# Patient Record
Sex: Female | Born: 1985 | Race: White | Hispanic: No | Marital: Married | Smoking: Never smoker
Health system: Southern US, Community
[De-identification: ages and names within clinical notes are randomized; demographics above are authoritative.]

## PROBLEM LIST (undated history)

## (undated) ENCOUNTER — Inpatient Hospital Stay (HOSPITAL_COMMUNITY): Payer: Self-pay

## (undated) DIAGNOSIS — G4733 Obstructive sleep apnea (adult) (pediatric): Secondary | ICD-10-CM

## (undated) DIAGNOSIS — T7840XA Allergy, unspecified, initial encounter: Secondary | ICD-10-CM

## (undated) DIAGNOSIS — E538 Deficiency of other specified B group vitamins: Secondary | ICD-10-CM

## (undated) DIAGNOSIS — Z9889 Other specified postprocedural states: Secondary | ICD-10-CM

## (undated) DIAGNOSIS — N926 Irregular menstruation, unspecified: Secondary | ICD-10-CM

## (undated) DIAGNOSIS — J301 Allergic rhinitis due to pollen: Secondary | ICD-10-CM

## (undated) DIAGNOSIS — N390 Urinary tract infection, site not specified: Secondary | ICD-10-CM

## (undated) DIAGNOSIS — G43909 Migraine, unspecified, not intractable, without status migrainosus: Secondary | ICD-10-CM

## (undated) DIAGNOSIS — N898 Other specified noninflammatory disorders of vagina: Secondary | ICD-10-CM

## (undated) DIAGNOSIS — F419 Anxiety disorder, unspecified: Secondary | ICD-10-CM

## (undated) DIAGNOSIS — B019 Varicella without complication: Secondary | ICD-10-CM

## (undated) DIAGNOSIS — Z87442 Personal history of urinary calculi: Secondary | ICD-10-CM

## (undated) DIAGNOSIS — D509 Iron deficiency anemia, unspecified: Secondary | ICD-10-CM

## (undated) DIAGNOSIS — N760 Acute vaginitis: Secondary | ICD-10-CM

## (undated) DIAGNOSIS — R112 Nausea with vomiting, unspecified: Secondary | ICD-10-CM

## (undated) DIAGNOSIS — Z309 Encounter for contraceptive management, unspecified: Secondary | ICD-10-CM

## (undated) DIAGNOSIS — I10 Essential (primary) hypertension: Secondary | ICD-10-CM

## (undated) DIAGNOSIS — N930 Postcoital and contact bleeding: Secondary | ICD-10-CM

## (undated) DIAGNOSIS — N189 Chronic kidney disease, unspecified: Secondary | ICD-10-CM

## (undated) DIAGNOSIS — B9689 Other specified bacterial agents as the cause of diseases classified elsewhere: Secondary | ICD-10-CM

## (undated) HISTORY — DX: Other specified bacterial agents as the cause of diseases classified elsewhere: B96.89

## (undated) HISTORY — DX: Encounter for contraceptive management, unspecified: Z30.9

## (undated) HISTORY — DX: Postcoital and contact bleeding: N93.0

## (undated) HISTORY — DX: Urinary tract infection, site not specified: N39.0

## (undated) HISTORY — DX: Deficiency of other specified B group vitamins: E53.8

## (undated) HISTORY — PX: DILATION AND CURETTAGE OF UTERUS: SHX78

## (undated) HISTORY — PX: LAPAROSCOPIC GASTRIC SLEEVE RESECTION: SHX5895

## (undated) HISTORY — PX: TONSILLECTOMY: SUR1361

## (undated) HISTORY — DX: Migraine, unspecified, not intractable, without status migrainosus: G43.909

## (undated) HISTORY — DX: Iron deficiency anemia, unspecified: D50.9

## (undated) HISTORY — DX: Allergy, unspecified, initial encounter: T78.40XA

## (undated) HISTORY — DX: Essential (primary) hypertension: I10

## (undated) HISTORY — DX: Anxiety disorder, unspecified: F41.9

## (undated) HISTORY — DX: Obstructive sleep apnea (adult) (pediatric): G47.33

## (undated) HISTORY — DX: Irregular menstruation, unspecified: N92.6

## (undated) HISTORY — DX: Varicella without complication: B01.9

## (undated) HISTORY — DX: Acute vaginitis: N76.0

## (undated) HISTORY — DX: Allergic rhinitis due to pollen: J30.1

---
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
## 2005-01-11 ENCOUNTER — Observation Stay (HOSPITAL_COMMUNITY): Admission: RE | Admit: 2005-01-11 | Discharge: 2005-01-11 | Payer: Self-pay | Admitting: Obstetrics and Gynecology

## 2005-01-12 ENCOUNTER — Ambulatory Visit (HOSPITAL_COMMUNITY): Admission: RE | Admit: 2005-01-12 | Discharge: 2005-01-12 | Payer: Self-pay | Admitting: Obstetrics & Gynecology

## 2005-02-02 ENCOUNTER — Ambulatory Visit: Payer: Self-pay | Admitting: Obstetrics and Gynecology

## 2005-02-02 ENCOUNTER — Inpatient Hospital Stay (HOSPITAL_COMMUNITY): Admission: AD | Admit: 2005-02-02 | Discharge: 2005-02-02 | Payer: Self-pay | Admitting: *Deleted

## 2005-02-06 ENCOUNTER — Ambulatory Visit (HOSPITAL_COMMUNITY): Admission: AD | Admit: 2005-02-06 | Discharge: 2005-02-06 | Payer: Self-pay | Admitting: Obstetrics and Gynecology

## 2005-02-07 ENCOUNTER — Observation Stay (HOSPITAL_COMMUNITY): Admission: RE | Admit: 2005-02-07 | Discharge: 2005-02-08 | Payer: Self-pay | Admitting: Obstetrics and Gynecology

## 2005-02-08 ENCOUNTER — Ambulatory Visit: Payer: Self-pay | Admitting: Cardiology

## 2005-02-08 ENCOUNTER — Encounter: Payer: Self-pay | Admitting: Obstetrics and Gynecology

## 2005-02-08 ENCOUNTER — Ambulatory Visit (HOSPITAL_COMMUNITY): Admission: RE | Admit: 2005-02-08 | Discharge: 2005-02-08 | Payer: Self-pay | Admitting: Obstetrics and Gynecology

## 2005-02-20 ENCOUNTER — Ambulatory Visit (HOSPITAL_COMMUNITY): Admission: AD | Admit: 2005-02-20 | Discharge: 2005-02-20 | Payer: Self-pay | Admitting: Obstetrics and Gynecology

## 2005-02-28 ENCOUNTER — Ambulatory Visit (HOSPITAL_COMMUNITY): Admission: AD | Admit: 2005-02-28 | Discharge: 2005-02-28 | Payer: Self-pay | Admitting: Obstetrics and Gynecology

## 2005-03-02 ENCOUNTER — Ambulatory Visit (HOSPITAL_COMMUNITY): Admission: AD | Admit: 2005-03-02 | Discharge: 2005-03-02 | Payer: Self-pay | Admitting: Obstetrics and Gynecology

## 2005-03-24 ENCOUNTER — Ambulatory Visit (HOSPITAL_COMMUNITY): Admission: AD | Admit: 2005-03-24 | Discharge: 2005-03-24 | Payer: Self-pay | Admitting: Obstetrics and Gynecology

## 2005-03-25 ENCOUNTER — Inpatient Hospital Stay (HOSPITAL_COMMUNITY): Admission: AD | Admit: 2005-03-25 | Discharge: 2005-03-27 | Payer: Self-pay | Admitting: Obstetrics and Gynecology

## 2005-06-02 ENCOUNTER — Emergency Department (HOSPITAL_COMMUNITY): Admission: EM | Admit: 2005-06-02 | Discharge: 2005-06-02 | Payer: Self-pay | Admitting: Emergency Medicine

## 2005-07-05 ENCOUNTER — Emergency Department (HOSPITAL_COMMUNITY): Admission: EM | Admit: 2005-07-05 | Discharge: 2005-07-05 | Payer: Self-pay | Admitting: Emergency Medicine

## 2005-07-21 ENCOUNTER — Emergency Department (HOSPITAL_COMMUNITY): Admission: EM | Admit: 2005-07-21 | Discharge: 2005-07-21 | Payer: Self-pay | Admitting: Emergency Medicine

## 2005-07-22 ENCOUNTER — Ambulatory Visit: Payer: Self-pay | Admitting: Family Medicine

## 2005-07-23 ENCOUNTER — Ambulatory Visit: Payer: Self-pay | Admitting: Family Medicine

## 2006-06-29 ENCOUNTER — Observation Stay (HOSPITAL_COMMUNITY): Admission: AD | Admit: 2006-06-29 | Discharge: 2006-06-29 | Payer: Self-pay | Admitting: Obstetrics and Gynecology

## 2006-08-14 ENCOUNTER — Observation Stay (HOSPITAL_COMMUNITY): Admission: AD | Admit: 2006-08-14 | Discharge: 2006-08-15 | Payer: Self-pay | Admitting: Obstetrics and Gynecology

## 2006-09-18 ENCOUNTER — Emergency Department (HOSPITAL_COMMUNITY): Admission: EM | Admit: 2006-09-18 | Discharge: 2006-09-18 | Payer: Self-pay | Admitting: Emergency Medicine

## 2006-09-27 ENCOUNTER — Ambulatory Visit (HOSPITAL_COMMUNITY): Admission: AD | Admit: 2006-09-27 | Discharge: 2006-09-27 | Payer: Self-pay | Admitting: Obstetrics and Gynecology

## 2006-10-07 ENCOUNTER — Ambulatory Visit (HOSPITAL_COMMUNITY): Admission: RE | Admit: 2006-10-07 | Discharge: 2006-10-07 | Payer: Self-pay | Admitting: Orthopaedic Surgery

## 2006-10-16 ENCOUNTER — Inpatient Hospital Stay (HOSPITAL_COMMUNITY): Admission: RE | Admit: 2006-10-16 | Discharge: 2006-10-18 | Payer: Self-pay | Admitting: Obstetrics and Gynecology

## 2007-10-11 ENCOUNTER — Emergency Department (HOSPITAL_COMMUNITY): Admission: EM | Admit: 2007-10-11 | Discharge: 2007-10-11 | Payer: Self-pay | Admitting: Emergency Medicine

## 2008-01-05 ENCOUNTER — Emergency Department (HOSPITAL_COMMUNITY): Admission: EM | Admit: 2008-01-05 | Discharge: 2008-01-05 | Payer: Self-pay | Admitting: Emergency Medicine

## 2008-04-19 ENCOUNTER — Emergency Department (HOSPITAL_COMMUNITY): Admission: EM | Admit: 2008-04-19 | Discharge: 2008-04-20 | Payer: Self-pay | Admitting: *Deleted

## 2008-04-29 ENCOUNTER — Ambulatory Visit: Payer: Self-pay | Admitting: Obstetrics & Gynecology

## 2008-04-29 ENCOUNTER — Inpatient Hospital Stay (HOSPITAL_COMMUNITY): Admission: AD | Admit: 2008-04-29 | Discharge: 2008-04-29 | Payer: Self-pay | Admitting: Obstetrics & Gynecology

## 2008-08-03 ENCOUNTER — Ambulatory Visit: Payer: Self-pay | Admitting: Family Medicine

## 2008-08-03 ENCOUNTER — Inpatient Hospital Stay (HOSPITAL_COMMUNITY): Admission: AD | Admit: 2008-08-03 | Discharge: 2008-08-03 | Payer: Self-pay | Admitting: Obstetrics & Gynecology

## 2008-08-07 ENCOUNTER — Inpatient Hospital Stay (HOSPITAL_COMMUNITY): Admission: AD | Admit: 2008-08-07 | Discharge: 2008-08-09 | Payer: Self-pay | Admitting: Obstetrics & Gynecology

## 2008-08-07 ENCOUNTER — Ambulatory Visit: Payer: Self-pay | Admitting: Obstetrics and Gynecology

## 2008-08-11 ENCOUNTER — Encounter: Admission: RE | Admit: 2008-08-11 | Discharge: 2008-09-10 | Payer: Self-pay | Admitting: Obstetrics and Gynecology

## 2008-09-11 ENCOUNTER — Encounter: Admission: RE | Admit: 2008-09-11 | Discharge: 2008-10-10 | Payer: Self-pay | Admitting: Obstetrics and Gynecology

## 2008-10-11 ENCOUNTER — Encounter: Admission: RE | Admit: 2008-10-11 | Discharge: 2008-11-10 | Payer: Self-pay | Admitting: Obstetrics and Gynecology

## 2008-11-11 ENCOUNTER — Encounter: Admission: RE | Admit: 2008-11-11 | Discharge: 2008-12-10 | Payer: Self-pay | Admitting: Obstetrics and Gynecology

## 2008-12-11 ENCOUNTER — Encounter: Admission: RE | Admit: 2008-12-11 | Discharge: 2009-01-10 | Payer: Self-pay | Admitting: Obstetrics and Gynecology

## 2009-01-11 ENCOUNTER — Encounter: Admission: RE | Admit: 2009-01-11 | Discharge: 2009-02-10 | Payer: Self-pay | Admitting: Obstetrics and Gynecology

## 2009-02-11 ENCOUNTER — Encounter: Admission: RE | Admit: 2009-02-11 | Discharge: 2009-03-12 | Payer: Self-pay | Admitting: Obstetrics and Gynecology

## 2009-03-13 ENCOUNTER — Encounter: Admission: RE | Admit: 2009-03-13 | Discharge: 2009-04-12 | Payer: Self-pay | Admitting: Obstetrics and Gynecology

## 2009-12-30 ENCOUNTER — Emergency Department (HOSPITAL_COMMUNITY): Admission: EM | Admit: 2009-12-30 | Discharge: 2009-12-30 | Payer: Self-pay | Admitting: Emergency Medicine

## 2010-02-07 ENCOUNTER — Emergency Department (HOSPITAL_COMMUNITY): Admission: EM | Admit: 2010-02-07 | Discharge: 2010-02-07 | Payer: Self-pay | Admitting: Emergency Medicine

## 2010-06-13 ENCOUNTER — Ambulatory Visit: Admit: 2010-06-13 | Payer: Self-pay | Admitting: Family Medicine

## 2010-06-17 ENCOUNTER — Encounter: Payer: Self-pay | Admitting: *Deleted

## 2010-09-06 LAB — URINALYSIS, ROUTINE W REFLEX MICROSCOPIC
Bilirubin Urine: NEGATIVE
Glucose, UA: NEGATIVE mg/dL
Hgb urine dipstick: NEGATIVE
Ketones, ur: 15 mg/dL — AB
Nitrite: NEGATIVE
Nitrite: NEGATIVE
Specific Gravity, Urine: 1.01 (ref 1.005–1.030)
Specific Gravity, Urine: >1.03 — ABNORMAL HIGH (ref 1.005–1.030)
Urobilinogen, UA: 2 mg/dL — ABNORMAL HIGH (ref 0.0–1.0)
pH: 6 (ref 5.0–8.0)

## 2010-09-06 LAB — CBC
HCT: 29.4 % — ABNORMAL LOW (ref 36.0–46.0)
Hemoglobin: 8.2 g/dL — ABNORMAL LOW (ref 12.0–15.0)
Hemoglobin: 9.5 g/dL — ABNORMAL LOW (ref 12.0–15.0)
MCHC: 32.4 g/dL (ref 30.0–36.0)
MCV: 71.9 fL — ABNORMAL LOW (ref 78.0–100.0)
Platelets: 291 10*3/uL (ref 150–400)
Platelets: 352 10*3/uL (ref 150–400)
RBC: 3.57 MIL/uL — ABNORMAL LOW (ref 3.87–5.11)
RBC: 4.11 MIL/uL (ref 3.87–5.11)
RDW: 15.4 % (ref 11.5–15.5)
WBC: 8 10*3/uL (ref 4.0–10.5)

## 2010-09-06 LAB — URINE MICROSCOPIC-ADD ON

## 2010-09-06 LAB — URINE CULTURE

## 2010-09-06 LAB — RPR: RPR Ser Ql: NONREACTIVE

## 2010-09-06 LAB — WET PREP, GENITAL
Trich, Wet Prep: NONE SEEN
Yeast Wet Prep HPF POC: NONE SEEN

## 2010-09-06 LAB — GC/CHLAMYDIA PROBE AMP, GENITAL: Chlamydia, DNA Probe: NEGATIVE

## 2010-09-25 ENCOUNTER — Ambulatory Visit (INDEPENDENT_AMBULATORY_CARE_PROVIDER_SITE_OTHER): Payer: BC Managed Care – PPO | Admitting: Family Medicine

## 2010-09-25 ENCOUNTER — Encounter: Payer: Self-pay | Admitting: Family Medicine

## 2010-09-25 VITALS — BP 128/82 | HR 75 | Temp 97.4°F | Resp 14 | Ht 63.75 in | Wt 226.0 lb

## 2010-09-25 DIAGNOSIS — G43909 Migraine, unspecified, not intractable, without status migrainosus: Secondary | ICD-10-CM

## 2010-09-25 DIAGNOSIS — F419 Anxiety disorder, unspecified: Secondary | ICD-10-CM

## 2010-09-25 DIAGNOSIS — R635 Abnormal weight gain: Secondary | ICD-10-CM

## 2010-09-25 DIAGNOSIS — F411 Generalized anxiety disorder: Secondary | ICD-10-CM

## 2010-09-25 LAB — BASIC METABOLIC PANEL
BUN: 14 mg/dL (ref 6–23)
CO2: 30 mEq/L (ref 19–32)
Calcium: 9.1 mg/dL (ref 8.4–10.5)
GFR: 103.56 mL/min (ref >60.00)
Glucose, Bld: 85 mg/dL (ref 70–99)
Sodium: 140 mEq/L (ref 135–145)

## 2010-09-25 LAB — HEPATIC FUNCTION PANEL
Bilirubin, Direct: 0.1 mg/dL (ref 0.0–0.3)
Total Bilirubin: 0.7 mg/dL (ref 0.3–1.2)

## 2010-09-25 LAB — CBC WITH DIFFERENTIAL/PLATELET
HCT: 40.4 % (ref 36.0–46.0)
Hemoglobin: 13.9 g/dL (ref 12.0–15.0)
MCHC: 34.5 g/dL (ref 30.0–36.0)
MCV: 88.8 fl (ref 78.0–100.0)
Monocytes Absolute: 0.5 10*3/uL (ref 0.1–1.0)
Neutrophils Relative %: 65.1 % (ref 43.0–77.0)
Platelets: 313 10*3/uL (ref 150.0–400.0)
RBC: 4.55 Mil/uL (ref 3.87–5.11)

## 2010-09-25 LAB — TSH: TSH: 1.68 u[IU]/mL (ref 0.35–5.50)

## 2010-09-25 MED ORDER — PHENTERMINE HCL 37.5 MG PO CAPS: 37.5000 mg | ORAL_CAPSULE | ORAL | Status: DC

## 2010-09-25 MED ORDER — CITALOPRAM HYDROBROMIDE 20 MG PO TABS: 20.0000 mg | ORAL_TABLET | Freq: Every day | ORAL | Status: DC

## 2010-09-25 NOTE — Progress Notes (Signed)
  Subjective:    Patient ID: Audrey [name], female    DOB: 1986-04-14, 25 y.o.   MRN: [id number]  HPI 25 yr old female to re-establish with [place] after an absence of around 3 years. She has several issues to discuss. First she asks for help in losing weight. She has been following a strict low carb and low fat diet and she works out at [employer] 4 days a week, but she cannot seem to lose weight. This is very frustrating to her. Also she has had trouble with anxiety for several years. She worries about everything, can't relax, and she has a very short temper. She gets angry at her children over inconsequential things. She denies any depression symptoms.    Review of Systems  Constitutional: Negative.   Respiratory: Negative.   Cardiovascular: Negative.   Gastrointestinal: Negative.   Psychiatric/Behavioral: Positive for decreased concentration. Negative for hallucinations, behavioral problems, confusion, sleep disturbance, dysphoric mood and agitation. The patient is nervous/anxious. The patient is not hyperactive.        Objective:   Physical Exam  Constitutional: She appears well-developed and well-nourished.  Neck: No thyromegaly present.  Cardiovascular: Normal rate, regular rhythm, normal heart sounds and intact distal pulses.   Pulmonary/Chest: Effort normal and breath sounds normal.  Lymphadenopathy:    She has no cervical adenopathy.  Psychiatric: She has a normal mood and affect. Her behavior is normal. Thought content normal.          Assessment & Plan:  We will try her on an appetite suppressant med. Try Citalopram for anxiety.

## 2010-09-27 ENCOUNTER — Telehealth: Payer: Self-pay | Admitting: Family Medicine

## 2010-09-27 NOTE — Telephone Encounter (Signed)
LMOM to inform Pt. 

## 2010-09-27 NOTE — Telephone Encounter (Signed)
Message copied by Burnard Leigh on Thu Sep 27, 2010  8:56 AM ------      Message from: Dwaine Deter      Created: Wed Sep 26, 2010 12:28 PM       All normal

## 2010-10-09 NOTE — Group Therapy Note (Signed)
NAMEGeorgina Pillion [name]                ACCOUNT NO.:  [id number]   MEDICAL RECORD NO.:  [id number]          PATIENT TYPE:  INP   LOCATION:  LDR1                          FACILITY:  [facility]   PHYSICIAN:  Lazaro Arms, M.D.   DATE OF BIRTH:  1985/11/17   DATE OF PROCEDURE:  DATE OF DISCHARGE:                                 PROGRESS NOTE   Audrey [name] got comfortable with her epidural and progressed rapidly through  labor.  She was noted to be completely dilated with a urge to push  around 11:45.  I came over and she had a short second stage where she  pushed over three contractions and had a spontaneous vaginal delivery of  a viable female infant at 12:03.  The mouth and nose were suctioned on  the perineum with a bulb syringe and the body delivered without  difficulty.  Twenty units of Pitocin diluted in 1000 cc of lactated  Ringer's was infused rapidly IV.  The placenta separated spontaneously  and delivered via controlled cord traction and maternal pushing effort  at 12:10.  It was inspected and appears to be intact with a three-vessel  cord and there was also a true knot.  Estimated blood loss 200 cc.  The  vagina was inspected and no lacerations were found.  The epidural  catheter was removed with the blue tip visualized as being intact.      Jacklyn Shell, C.N.M.      Lazaro Arms, M.D.  Electronically Signed    FC/MEDQ  D:  10/16/2006  T:  10/16/2006  Job:  [id number]   cc:   Jeoffrey Massed, MD  Fax: [phone]

## 2010-10-09 NOTE — Consult Note (Signed)
Audrey [name], Audrey [name]                ACCOUNT NO.:  [id number]   MEDICAL RECORD NO.:  [id number]          PATIENT TYPE:  OIB   LOCATION:  A415                          FACILITY:  [facility]   PHYSICIAN:  Tilda Burrow, M.D. DATE OF BIRTH:  18-Sep-1985   DATE OF CONSULTATION:  09/27/2006  DATE OF DISCHARGE:  09/27/2006                                 CONSULTATION   Observation to rule out labor.   HISTORY:  A 25 year old gravida 3, para 1 with complaints of low  abdominal and back pressure, similar to what she remembers from her last  pregnancy.  She is 36.[redacted] weeks gestation.  There has been no bleeding or  membrane rupture.  The baby remains active.  She presents to labor and  delivery mid afternoon on Sep 27, 2006 with back pain and pressure  complaints.  External monitor shows a reactive fetal heart rate tracing  with the baby in excellent condition.  External monitoring also shows  mild uterine contractions of an irregular nature.  Cervical exam is  tight to fingertip, uneffaced by __nurse eval__ .  One hour of  observation results in no significant change in labor, and the back  pressure is less with her resting.   IMPRESSION:  Nonspecific discomforts of late pregnancy.   Routine signs and symptoms of labor reviewed.  The patient aware to  expect progressive pressure moving forward.  To return p.r.n. rupture of  membranes, bleeding, etc.  Follow up as scheduled.      Tilda Burrow, M.D.  Electronically Signed     JVF/MEDQ  D:  09/27/2006  T:  09/27/2006  Job:  [id number]

## 2010-10-09 NOTE — H&P (Signed)
NAMEMARLISE, FAHR                ACCOUNT NO.:  [id number]   MEDICAL RECORD NO.:  [id number]          PATIENT TYPE:  INP   LOCATION:  A401                          FACILITY:  [facility]   PHYSICIAN:  Lazaro Arms, M.D.   DATE OF BIRTH:  Feb 04, 1986   DATE OF ADMISSION:  10/23/2006  DATE OF DISCHARGE:  LH                              HISTORY & PHYSICAL   CHIEF COMPLAINT:  Elective induction of labor due to maternal discomfort  and psychosocial reasons.   HISTORY OF PRESENT ILLNESS:  Audrey [name] is a 25 year old gravida 3, para 1 AB  1 with an EDC of Oct 21, 2006, based on last menstrual period and  correlating first and second trimester ultrasounds placing her at 39-1/[redacted]  weeks gestation.  She began prenatal care in her first trimester and has  had regular visits.  Her prenatal course has been uneventful.   PRENATAL LABS:  Blood type A+, varicella immune, rubella immune.  HB,  SAG, HIV, RPR, HSV, gonorrhea, chlamydia and Group B strep are all  negative.  MSAFP was within normal limits as was her one-hour GTT.  Blood pressures have been one 100s-120s/60s to 80s.  Total weight gain  has been from 210-220 with appropriate fundal height growth.   PAST MEDICAL HISTORY:  Noncontributory.   SURGICAL HISTORY:  A D&C in 2005.   ALLERGIES:  NO DRUG ALLERGIES.   MEDICATIONS:  No medications.   SOCIAL HISTORY:  Denies cigarette smoking, alcohol or drug use.  She is  a married housewife.   FAMILY HISTORY:  Positive for hypertension, diabetes, coronary artery  disease and cancer.   OBSTETRICAL HISTORY:  1. In 2005, a D&C for missed AB.  2. In 2006, had a vaginal delivery of a 6 pound 14 ounce female      without complications at term.   PHYSICAL EXAM:  HEENT: Within normal limits.  HEART:  Regular right rate and rhythm.  LUNGS:  Clear.  ABDOMEN:  Soft and nontender.  Fundal height is 37 cm.  Estimated fetal  weight about 7 pounds.  CERVIX:  4 cm, 50% effaced, -1 station, vertex presentation  confirmed.  EXTREMITIES:  Legs are trace edema.   IMPRESSION:  Intrauterine pregnancy at 39-1/2 weeks with cervical  favorability, elective induction of labor due to father of the baby  needing to go out of town to work next Monday.   PLAN:  Will admit on the morning of Oct 23, 2006, plan AROM, Pitocin and  early epidural.      Jacklyn Shell, C.N.M.      Lazaro Arms, M.D.  Electronically Signed    FC/MEDQ  D:  10/14/2006  T:  10/14/2006  Job:  [id number]   cc:   Lorin Picket A. Gerda Diss, MD  Fax: [phone]

## 2010-10-09 NOTE — Op Note (Signed)
NAMEGeorgina Pillion [name]                ACCOUNT NO.:  [id number]   MEDICAL RECORD NO.:  [id number]          PATIENT TYPE:  INP   LOCATION:  LDR1                          FACILITY:  [facility]   PHYSICIAN:  Lazaro Arms, M.D.   DATE OF BIRTH:  1986/03/28   DATE OF PROCEDURE:  10/16/2006  DATE OF DISCHARGE:                               OPERATIVE REPORT   [name] is a 25 year old gravida 2, para 1 being induced for social  reasons who is 4 cm, requesting epidural.   The patient is placed in the sitting position.  Betadine prep is used.  Lidocaine 1% is injected at the L3-L4 interspace.  The area is field  draped.  A 17 gauge Touhy needle is used.  Loss of resistance technique  is employed and the epidural space is found with one pass without  difficulty.  She did have a left leg paresthesia with feeding of the  catheter.  Then 10 mL of 0.125% Bupivacaine plain was given as a test  dose and then to dose up the epidural.  The patient tolerated the  procedure well.  She has a stable blood pressure.  Fetal heart rate  tracing is stable and she is starting to receive comfort.  She is bumped  slightly to her right side because of the left-sided paresthesia.      Lazaro Arms, M.D.  Electronically Signed     LHE/MEDQ  D:  10/16/2006  T:  10/16/2006  Job:  [id number]

## 2010-10-12 NOTE — Discharge Summary (Signed)
NAMENATIA, FAHMY                ACCOUNT NO.:  [id number]   MEDICAL RECORD NO.:  [id number]          PATIENT TYPE:  INP   LOCATION:  A427                          FACILITY:  [facility]   PHYSICIAN:  Tilda Burrow, M.D. DATE OF BIRTH:  1985/06/20   DATE OF ADMISSION:  12/04/2004  DATE OF DISCHARGE:  07/13/2006LH                                 DISCHARGE SUMMARY   REASON FOR ADMISSION:  Larie is an 25 year old gravida 1 who was admitted  for pyelonephritis on 12/04/2004.   HOSPITAL COURSE:  Essentially uneventful.  Labs were within normal limits,  with the exception that urine was positive for nitrates, leukocytes, blood,  and protein.  She had right CVA tenderness upon admission.  During her  hospital course, she ran a low-grade temperature, with the highest being  99.7.  She has responded well to IV Ancef.  We are going to discharge her  home today.   DISCHARGE MEDICATIONS:  1.  Keflex 500 three times a day for 10 days.  2.  Lortab for any discomfort she might have.   DISCHARGE INSTRUCTIONS:  She was instructed with any increased pain or CVA  tenderness or fever to report back to our office or the hospital.   FOLLOW UP:  Tuesday in the office.       DL/MEDQ  D:  16/02/9603  T:  12/06/2004  Job:  [id number]

## 2010-10-12 NOTE — Procedures (Signed)
NAMEJENNIEFER, SALAK                ACCOUNT NO.:  [id number]   MEDICAL RECORD NO.:  [id number]          PATIENT TYPE:  OUT   LOCATION:  RAD                           FACILITY:  [facility]   PHYSICIAN:  [place] Bing, M.D. [employer] OF BIRTH:  04/09/1986   DATE OF PROCEDURE:  02/08/2005  DATE OF DISCHARGE:                                  ECHOCARDIOGRAM   REFERRING:  Tilda Burrow, M.D.   CLINICAL DATA:  [age]-year-old woman with intrauterine pregnancy, chest  pain and dyspnea.   M-MODE:  Aorta 3.1, left atrium 3.7, septum 1.2, posterior wall 1.0, LV  diastole 4.4, LV systole 3.3.   1.  Technically adequate echocardiographic study.  2.  Normal left atrium, right atrium and right ventricle.  3.  Normal aortic, mitral, tricuspid and pulmonic valves; normal proximal      pulmonary artery.  4.  Normal Doppler study with trivial tricuspid and mitral regurgitation.  5.  Normal left ventricular size; LV wall thickness at the upper limit of      normal; normal regional and global systolic function.  6.  Normal IVC.      [place] Bing, M.D. [facility]  Electronically Signed     RR/MEDQ  D:  02/09/2005  T:  02/09/2005  Job:  [id number]

## 2011-02-20 LAB — URINE MICROSCOPIC-ADD ON

## 2011-02-20 LAB — URINALYSIS, ROUTINE W REFLEX MICROSCOPIC
Bilirubin Urine: NEGATIVE
Glucose, UA: NEGATIVE
Ketones, ur: NEGATIVE
Nitrite: POSITIVE — AB
Protein, ur: 30 — AB
Urobilinogen, UA: 0.2
pH: 5.5

## 2011-02-20 LAB — URINE CULTURE

## 2011-02-22 LAB — URINE MICROSCOPIC-ADD ON

## 2011-02-22 LAB — URINALYSIS, ROUTINE W REFLEX MICROSCOPIC
Bilirubin Urine: NEGATIVE
Glucose, UA: NEGATIVE
Hgb urine dipstick: NEGATIVE
pH: 5.5

## 2011-02-22 LAB — PREGNANCY, URINE: Preg Test, Ur: POSITIVE

## 2011-02-26 LAB — URINE MICROSCOPIC-ADD ON

## 2011-02-26 LAB — URINALYSIS, ROUTINE W REFLEX MICROSCOPIC
Glucose, UA: NEGATIVE
pH: 5.5

## 2011-02-26 LAB — CBC
HCT: 33.6 — ABNORMAL LOW
Hemoglobin: 11.2 — ABNORMAL LOW
Platelets: 420 — ABNORMAL HIGH
WBC: 7.4

## 2011-02-26 LAB — BASIC METABOLIC PANEL
BUN: 7
GFR calc non Af Amer: >60
Potassium: 3.6
Sodium: 131 — ABNORMAL LOW

## 2011-02-26 LAB — DIFFERENTIAL
Eosinophils Relative: 1
Lymphocytes Relative: 18
Lymphs Abs: 1.3
Neutro Abs: 5.5

## 2011-02-26 LAB — POCT PREGNANCY, URINE: Preg Test, Ur: POSITIVE

## 2011-02-26 LAB — URINE CULTURE

## 2011-03-01 LAB — DIFFERENTIAL
Basophils Absolute: 0.1 10*3/uL (ref 0.0–0.1)
Basophils Relative: 1 % (ref 0–1)
Eosinophils Absolute: 0 10*3/uL (ref 0.0–0.7)
Neutro Abs: 7.3 10*3/uL (ref 1.7–7.7)
Neutrophils Relative %: 80 % — ABNORMAL HIGH (ref 43–77)

## 2011-03-01 LAB — URINE MICROSCOPIC-ADD ON

## 2011-03-01 LAB — URINALYSIS, ROUTINE W REFLEX MICROSCOPIC
Hgb urine dipstick: NEGATIVE
Nitrite: NEGATIVE
Specific Gravity, Urine: >1.03 — ABNORMAL HIGH (ref 1.005–1.030)
Urobilinogen, UA: 1 mg/dL (ref 0.0–1.0)

## 2011-03-01 LAB — CBC
MCHC: 33.6 g/dL (ref 30.0–36.0)
MCV: 75.4 fL — ABNORMAL LOW (ref 78.0–100.0)
Platelets: 345 10*3/uL (ref 150–400)
RDW: 16.4 % — ABNORMAL HIGH (ref 11.5–15.5)

## 2011-03-01 LAB — HIV ANTIBODY (ROUTINE TESTING W REFLEX): HIV: NONREACTIVE

## 2011-03-01 LAB — URINE CULTURE: Colony Count: 15000

## 2011-03-01 LAB — WET PREP, GENITAL: Clue Cells Wet Prep HPF POC: NONE SEEN

## 2011-03-01 LAB — GC/CHLAMYDIA PROBE AMP, GENITAL: Chlamydia, DNA Probe: NEGATIVE

## 2011-03-01 LAB — RPR: RPR Ser Ql: NONREACTIVE

## 2011-03-16 ENCOUNTER — Emergency Department (HOSPITAL_COMMUNITY)
Admission: EM | Admit: 2011-03-16 | Discharge: 2011-03-16 | Disposition: A | Discharge status: Home / Self Care | Payer: BC Managed Care – PPO | Attending: Emergency Medicine | Admitting: Emergency Medicine

## 2011-03-16 ENCOUNTER — Encounter (HOSPITAL_COMMUNITY): Payer: Self-pay

## 2011-03-16 DIAGNOSIS — L255 Unspecified contact dermatitis due to plants, except food: Secondary | ICD-10-CM | POA: Insufficient documentation

## 2011-03-16 DIAGNOSIS — Z87442 Personal history of urinary calculi: Secondary | ICD-10-CM | POA: Insufficient documentation

## 2011-03-16 DIAGNOSIS — L237 Allergic contact dermatitis due to plants, except food: Secondary | ICD-10-CM

## 2011-03-16 DIAGNOSIS — T622X1A Toxic effect of other ingested (parts of) plant(s), accidental (unintentional), initial encounter: Secondary | ICD-10-CM | POA: Insufficient documentation

## 2011-03-16 MED ORDER — DEXAMETHASONE SODIUM PHOSPHATE 4 MG/ML IJ SOLN
10.0000 mg | Freq: Once | INTRAMUSCULAR | Status: AC
  Administered 2011-03-16: 10 mg via INTRAMUSCULAR
  Filled 2011-03-16: qty 3

## 2011-03-16 MED ORDER — DIPHENHYDRAMINE HCL 25 MG PO CAPS
50.0000 mg | ORAL_CAPSULE | Freq: Once | ORAL | Status: AC
  Administered 2011-03-16: 50 mg via ORAL
  Filled 2011-03-16: qty 2

## 2011-03-16 MED ORDER — PREDNISONE 10 MG PO TABS: ORAL_TABLET | ORAL | Status: DC

## 2011-03-16 NOTE — ED Notes (Signed)
Poison ivy to body x 3 days

## 2011-03-16 NOTE — ED Provider Notes (Signed)
History     CSN: [id number] Arrival date & time: 03/16/2011  8:30 PM   First MD Initiated Contact with Patient 03/16/11 2110      Chief Complaint  Patient presents with  . Rash    (Consider location/radiation/quality/duration/timing/severity/associated sxs/prior treatment) Patient is a 25 y.o. female presenting with rash. The history is provided by the patient.  Rash  This is a new problem. The current episode started more than 2 days ago. The problem has been gradually worsening. The problem is associated with plant contact. There has been no fever. The rash is present on the face, neck, left arm and right arm. The patient is experiencing no pain. The pain has been constant since onset. Associated symptoms include itching. Pertinent negatives include no blisters, no pain and no weeping. She has tried nothing for the symptoms. The treatment provided no relief.    Past Medical History  Diagnosis Date  . Chicken pox   . Migraines   . Allergy   . Hay fever   . UTI (urinary tract infection)   . Kidney stones     Past Surgical History  Procedure Date  . Tonsillectomy   . Dilation and curettage of uterus     Family History  Problem Relation Age of Onset  . Arthritis Mother   . Lung cancer Mother   . Hyperlipidemia Mother   . Atrial fibrillation Mother   . Stroke Mother   . Hypertension Mother   . Diabetes Father   . Dementia Father   . Breast cancer Maternal Aunt   . Diabetes Paternal Uncle     History  Substance Use Topics  . Smoking status: Never Smoker   . Smokeless tobacco: Not on file  . Alcohol Use: No    OB History    Grav Para Term Preterm Abortions TAB SAB Ect Mult Living                  Review of Systems  Constitutional: Negative for fever, chills and fatigue.  HENT: Negative for sore throat, trouble swallowing, neck pain and neck stiffness.   Respiratory: Negative for cough, shortness of breath and wheezing.   Cardiovascular: Negative for chest  pain.  Genitourinary: Negative for dysuria, hematuria and flank pain.  Musculoskeletal: Negative for myalgias, back pain and arthralgias.  Skin: Positive for itching and rash.  Neurological: Negative for dizziness, weakness, numbness and headaches.  Hematological: Negative for adenopathy. Does not bruise/bleed easily.  All other systems reviewed and are negative.    Allergies  Bee venom; Codeine; Milk-related compounds; and Penicillins  Home Medications   Current Outpatient Rx  Name Route Sig Dispense Refill  . AZITHROMYCIN 250 MG PO TABS Oral Take 250 mg by mouth daily. Take two tablets on day 1 then take one tablet every day for 4 daysa     . CITALOPRAM HYDROBROMIDE 20 MG PO TABS Oral Take 1 tablet (20 mg total) by mouth daily. 30 tablet 5  . DIPHENHYDRAMINE HCL (SLEEP) 25 MG PO TABS Oral Take 75 mg by mouth once as needed. For allergic reaction     . LEVONORGESTREL 20 MCG/24HR IU IUD Intrauterine 1 each by Intrauterine route once.      . SUPER HIGH VITAMINS/MINERALS PO TABS Oral Take 1 tablet by mouth daily.        BP 123/79  Pulse 84  Temp(Src) 97.6 F (36.4 C) (Oral)  Resp 16  Ht 5\' 4"  (1.626 m)  Wt 189 lb (85.73 kg)  BMI 32.44 kg/m2  SpO2 99%  Physical Exam  Nursing note and vitals reviewed. Constitutional: She is oriented to person, place, and time. She appears well-developed and well-nourished. No distress.  HENT:  Head: Normocephalic and atraumatic.  Mouth/Throat: Oropharynx is clear and moist.  Eyes: EOM are normal. Pupils are equal, round, and reactive to light.  Neck: Normal range of motion. Neck supple.  Cardiovascular: Normal rate, regular rhythm and normal heart sounds.   Pulmonary/Chest: Effort normal and breath sounds normal. No respiratory distress. She has no wheezes. She exhibits no tenderness.  Musculoskeletal: Normal range of motion. She exhibits no edema and no tenderness.       See skin exam  Lymphadenopathy:    She has no cervical adenopathy.    Neurological: She is alert and oriented to person, place, and time. No cranial nerve deficit. She exhibits normal muscle tone. Coordination normal.  Skin: Skin is warm and dry. Rash noted. Rash is maculopapular.       Maculopapular , erythematous rash to neck, face, upper chest and bilateral arms.  Few vesicles in linear pattern present to the arms.  No edema.  Airway clear.    ED Course  Procedures (including critical care time)       MDM   9:54 PM contact dermatitis due to poison ivy exposure.  No airway compromise, no facial edema.    OUTPATIENT MEDICATIONS PRESCRIBED FROM THE ED:   Patient's Medications  New Prescriptions   PREDNISONE (DELTASONE) 10 MG TABLET    Take 6 tablets day one, 5 tablets day two, 4 tablets day three, 3 tablets day four, 2 tablets day five, then 1 tablet day six  Previous Medications   AZITHROMYCIN (ZITHROMAX) 250 MG TABLET    Take 250 mg by mouth daily. Take two tablets on day 1 then take one tablet every day for 4 daysa    CITALOPRAM (CELEXA) 20 MG TABLET    Take 1 tablet (20 mg total) by mouth daily.   DIPHENHYDRAMINE (SOMINEX) 25 MG TABLET    Take 75 mg by mouth once as needed. For allergic reaction    LEVONORGESTREL (MIRENA) 20 MCG/24HR IUD    1 each by Intrauterine route once.     MULTIPLE VITAMINS-MINERALS (MULTIVITAMIN,TX-MINERALS) TABLET    Take 1 tablet by mouth daily.    Modified Medications   No medications on file  Discontinued Medications   No medications on file       Patient / Family / Caregiver understand and agree with initial ED impression and plan with expectations set for ED visit.       Ciel Chervenak L. [place], [place] 03/20/11 2113

## 2011-03-16 NOTE — ED Notes (Signed)
Pt a/ox4. resp even and unlabored. NAD at this time. D/C instructions and Rx x1 reviewed with pt. Pt verbalized understanding. Pt ambulated to POV with steady gate.  

## 2011-03-26 NOTE — ED Provider Notes (Signed)
Medical screening examination/treatment/procedure(s) were performed by non-physician practitioner and as supervising physician I was immediately available for consultation/collaboration.  Dyana Magner, MD 03/26/11 1654 

## 2011-06-23 ENCOUNTER — Emergency Department (HOSPITAL_COMMUNITY)
Admission: EM | Admit: 2011-06-23 | Discharge: 2011-06-23 | Disposition: A | Discharge status: Home / Self Care | Payer: BC Managed Care – PPO | Attending: Emergency Medicine | Admitting: Emergency Medicine

## 2011-06-23 ENCOUNTER — Encounter (HOSPITAL_COMMUNITY): Payer: Self-pay | Admitting: Emergency Medicine

## 2011-06-23 DIAGNOSIS — Z87442 Personal history of urinary calculi: Secondary | ICD-10-CM | POA: Insufficient documentation

## 2011-06-23 DIAGNOSIS — E669 Obesity, unspecified: Secondary | ICD-10-CM | POA: Insufficient documentation

## 2011-06-23 DIAGNOSIS — IMO0002 Reserved for concepts with insufficient information to code with codable children: Secondary | ICD-10-CM | POA: Insufficient documentation

## 2011-06-23 DIAGNOSIS — F410 Panic disorder [episodic paroxysmal anxiety] without agoraphobia: Secondary | ICD-10-CM | POA: Insufficient documentation

## 2011-06-23 DIAGNOSIS — R064 Hyperventilation: Secondary | ICD-10-CM

## 2011-06-23 MED ORDER — HYDROXYZINE HCL 25 MG PO TABS: ORAL_TABLET | ORAL | Status: DC
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
## 2011-12-18 ENCOUNTER — Telehealth: Payer: Self-pay | Admitting: Family Medicine

## 2011-12-18 NOTE — Telephone Encounter (Signed)
I spoke with pt  

## 2011-12-18 NOTE — Telephone Encounter (Signed)
She would need an OV for this  

## 2011-12-18 NOTE — Telephone Encounter (Signed)
Pt wanting to know if she can get a refill on Phentermine 37.5 mgm or if she needs to be seen. [facility]. Pt denies any new or worsening sxs.

## 2012-06-08 ENCOUNTER — Encounter: Payer: Self-pay | Admitting: Family Medicine

## 2012-06-08 ENCOUNTER — Ambulatory Visit (INDEPENDENT_AMBULATORY_CARE_PROVIDER_SITE_OTHER): Payer: Managed Care, Other (non HMO) | Admitting: Family Medicine

## 2012-06-08 VITALS — BP 128/80 | HR 76 | Temp 98.3°F | Wt 232.0 lb

## 2012-06-08 DIAGNOSIS — F411 Generalized anxiety disorder: Secondary | ICD-10-CM

## 2012-06-08 DIAGNOSIS — F419 Anxiety disorder, unspecified: Secondary | ICD-10-CM | POA: Insufficient documentation

## 2012-06-08 DIAGNOSIS — E669 Obesity, unspecified: Secondary | ICD-10-CM

## 2012-06-08 MED ORDER — ESCITALOPRAM OXALATE 5 MG PO TABS: 5.0000 mg | ORAL_TABLET | Freq: Every day | ORAL | Status: DC

## 2012-06-08 MED ORDER — PHENTERMINE HCL 37.5 MG PO CAPS: 37.5000 mg | ORAL_CAPSULE | ORAL | Status: DC

## 2012-06-08 NOTE — Progress Notes (Signed)
  Subjective:    Patient ID: Audrey [name], female    DOB: [birth year], 27 y.o.   MRN: [id number]  HPI Here to follow up on anxiety and obesity. Last year she tired Phentermine and did very well with it. She would to try it again. Also she had good results from Celexa but says it was too strong. She felt dazed at times and sluggish.    Review of Systems  Constitutional: Negative.   Respiratory: Negative.   Cardiovascular: Negative.   Neurological: Negative.   Psychiatric/Behavioral: Negative for confusion, dysphoric mood, decreased concentration and agitation. The patient is nervous/anxious. The patient is not hyperactive.        Objective:   Physical Exam  Constitutional: She is oriented to person, place, and time. She appears well-developed and well-nourished.  Cardiovascular: Normal rate, regular rhythm, normal heart sounds and intact distal pulses.   Pulmonary/Chest: Effort normal and breath sounds normal.  Neurological: She is alert and oriented to person, place, and time.  Psychiatric: She has a normal mood and affect. Her behavior is normal. Thought content normal.          Assessment & Plan:  Try Lexapro 5 mg a day. Refilled Phentermine.

## 2012-09-18 ENCOUNTER — Ambulatory Visit: Payer: Managed Care, Other (non HMO) | Admitting: Family Medicine

## 2012-10-06 ENCOUNTER — Telehealth: Payer: Self-pay | Admitting: Family Medicine

## 2012-10-06 NOTE — Telephone Encounter (Signed)
Pt would like to know if she could reschedule the mole eval/ removal for Friday, October 30, 2012? Pt would like to be able to go home for the weekend in case you do remove mole and not have to go back to work. Late in the afternoon on June 6 are only same day appts. . Pls advise.

## 2012-10-06 NOTE — Telephone Encounter (Signed)
Pt aware/kh 

## 2012-10-06 NOTE — Telephone Encounter (Signed)
Okay per Dr. Fry.  

## 2012-10-07 ENCOUNTER — Ambulatory Visit: Payer: Managed Care, Other (non HMO) | Admitting: Family Medicine

## 2012-10-30 ENCOUNTER — Ambulatory Visit: Payer: Managed Care, Other (non HMO) | Admitting: Family Medicine

## 2012-12-30 ENCOUNTER — Ambulatory Visit (INDEPENDENT_AMBULATORY_CARE_PROVIDER_SITE_OTHER): Payer: Managed Care, Other (non HMO) | Admitting: Adult Health

## 2012-12-30 ENCOUNTER — Encounter: Payer: Self-pay | Admitting: Adult Health

## 2012-12-30 VITALS — BP 120/88 | Ht 64.0 in | Wt 234.0 lb

## 2012-12-30 DIAGNOSIS — N760 Acute vaginitis: Secondary | ICD-10-CM

## 2012-12-30 DIAGNOSIS — B9689 Other specified bacterial agents as the cause of diseases classified elsewhere: Secondary | ICD-10-CM

## 2012-12-30 DIAGNOSIS — N926 Irregular menstruation, unspecified: Secondary | ICD-10-CM

## 2012-12-30 DIAGNOSIS — A499 Bacterial infection, unspecified: Secondary | ICD-10-CM

## 2012-12-30 HISTORY — DX: Other specified bacterial agents as the cause of diseases classified elsewhere: B96.89

## 2012-12-30 HISTORY — DX: Irregular menstruation, unspecified: N92.6

## 2012-12-30 LAB — POCT WET PREP (WET MOUNT): WBC, Wet Prep HPF POC: POSITIVE

## 2012-12-30 MED ORDER — METRONIDAZOLE 500 MG PO TABS: 500.0000 mg | ORAL_TABLET | Freq: Two times a day (BID) | ORAL | Status: DC

## 2012-12-30 NOTE — Patient Instructions (Addendum)
Bacterial Vaginosis Bacterial vaginosis (BV) is a vaginal infection where the normal balance of bacteria in the vagina is disrupted. The normal balance is then replaced by an overgrowth of certain bacteria. There are several different kinds of bacteria that can cause BV. BV is the most common vaginal infection in women of childbearing age. CAUSES   The cause of BV is not fully understood. BV develops when there is an increase or imbalance of harmful bacteria.  Some activities or behaviors can upset the normal balance of bacteria in the vagina and put women at increased risk including:  Having a new sex partner or multiple sex partners.  Douching.  Using an intrauterine device (IUD) for contraception.  It is not clear what role sexual activity plays in the development of BV. However, women that have never had sexual intercourse are rarely infected with BV. Women do not get BV from toilet seats, bedding, swimming pools or from touching objects around them.  SYMPTOMS   Grey vaginal discharge.  A fish-like odor with discharge, especially after sexual intercourse.  Itching or burning of the vagina and vulva.  Burning or pain with urination.  Some women have no signs or symptoms at all. DIAGNOSIS  Your caregiver must examine the vagina for signs of BV. Your caregiver will perform lab tests and look at the sample of vaginal fluid through a microscope. They will look for bacteria and abnormal cells (clue cells), a pH test higher than 4.5, and a positive amine test all associated with BV.  RISKS AND COMPLICATIONS   Pelvic inflammatory disease (PID).  Infections following gynecology surgery.  Developing HIV.  Developing herpes virus. TREATMENT  Sometimes BV will clear up without treatment. However, all women with symptoms of BV should be treated to avoid complications, especially if gynecology surgery is planned. Female partners generally do not need to be treated. However, BV may spread  between female sex partners so treatment is helpful in preventing a recurrence of BV.   BV may be treated with antibiotics. The antibiotics come in either pill or vaginal cream forms. Either can be used with nonpregnant or pregnant women, but the recommended dosages differ. These antibiotics are not harmful to the baby.  BV can recur after treatment. If this happens, a second round of antibiotics will often be prescribed.  Treatment is important for pregnant women. If not treated, BV can cause a premature delivery, especially for a pregnant woman who had a premature birth in the past. All pregnant women who have symptoms of BV should be checked and treated.  For chronic reoccurrence of BV, treatment with a type of prescribed gel vaginally twice a week is helpful. HOME CARE INSTRUCTIONS   Finish all medication as directed by your caregiver.  Do not have sex until treatment is completed.  Tell your sexual partner that you have a vaginal infection. They should see their caregiver and be treated if they have problems, such as a mild rash or itching.  Practice safe sex. Use condoms. Only have 1 sex partner. PREVENTION  Basic prevention steps can help reduce the risk of upsetting the natural balance of bacteria in the vagina and developing BV:  Do not have sexual intercourse (be abstinent).  Do not douche.  Use all of the medicine prescribed for treatment of BV, even if the signs and symptoms go away.  Tell your sex partner if you have BV. That way, they can be treated, if needed, to prevent reoccurrence. SEEK MEDICAL CARE IF:     Your symptoms are not improving after 3 days of treatment.  You have increased discharge, pain, or fever. MAKE SURE YOU:   Understand these instructions.  Will watch your condition.  Will get help right away if you are not doing well or get worse. FOR MORE INFORMATION  Division of STD Prevention (DSTDP), Centers for Disease Control and Prevention:  [employer] American Social Health Association (ASHA): www.ashastd.org  Document Released: 05/13/2005 Document Revised: 08/05/2011 Document Reviewed: 11/03/2008 [facility] Patient Information 2014 [place], [place]. Take flagyl no alcohol Follow up in 5 weeks

## 2012-12-30 NOTE — Progress Notes (Signed)
Subjective:     Patient ID: Audrey [name], female   DOB: 06-Oct-1985, 27 y.o.   MRN: [id number]  HPI Audrey [name] is a 27 year old white female with a IUD, complaining of irregular bleeding and bleeding after sex.The IUD was put in 07/2008/ Some cramps but no pain. Review of Systems Positives in HPI Reviewed past medical,surgical, social and family history. Reviewed medications and allergies.      Objective:   Physical Exam BP 120/88  Ht 5\' 4"  (1.626 m)  Wt 234 lb (106.142 kg)  BMI 40.15 kg/m2  LMP 12/25/2012   Skin warm and dry.Pelvic: external genitalia is normal in appearance, vagina: bloody discharge without odor, cervix:irritated and friable and bulbous,IUD strings are visible,uterus: normal size, shape and contour, non tender, no masses felt, adnexa: no masses or tenderness noted. Wet prep: + for clue cells and +WBCs and RBCs. GC/CHL obtained.  Assessment:     Irregular bleeding BV    Plan:     Rx flagyl 500 mg 1 bid x 7 days No alcohol  Review handout on BV Follow up in 5 weeks for Pap and physical Can call in am for GC/CHL results

## 2012-12-31 ENCOUNTER — Telehealth: Payer: Self-pay | Admitting: Obstetrics & Gynecology

## 2012-12-31 LAB — GC/CHLAMYDIA PROBE AMP
CT Probe RNA: NEGATIVE
GC Probe RNA: NEGATIVE

## 2012-12-31 NOTE — Telephone Encounter (Signed)
Pt informed of negative GC/CHL from 12/30/12.

## 2013-01-19 ENCOUNTER — Telehealth: Payer: Self-pay | Admitting: Adult Health

## 2013-01-19 MED ORDER — METRONIDAZOLE 500 MG PO TABS: 500.0000 mg | ORAL_TABLET | Freq: Two times a day (BID) | ORAL | Status: DC

## 2013-01-19 NOTE — Telephone Encounter (Signed)
Pt requesting another RX for Flagyl, states did not take med as prescribed. Pt skipped a few days in between taking med. Pt states having same symptoms as before, but not as bad.

## 2013-01-19 NOTE — Telephone Encounter (Signed)
Refilled flagyl pt aware.

## 2013-02-04 ENCOUNTER — Other Ambulatory Visit: Payer: Managed Care, Other (non HMO) | Admitting: Adult Health

## 2013-02-23 ENCOUNTER — Telehealth: Payer: Self-pay | Admitting: Adult Health

## 2013-02-23 MED ORDER — METRONIDAZOLE 0.75 % VA GEL: VAGINAL | Status: DC

## 2013-02-23 NOTE — Telephone Encounter (Signed)
Has had some bleeding after sex again will try metrogel

## 2013-02-25 ENCOUNTER — Telehealth: Payer: Self-pay | Admitting: Family Medicine

## 2013-02-25 NOTE — Telephone Encounter (Signed)
Pt would like refill of phentermine 37.5 MG capsule  Pharm recommend to pt to call in in case she needed OV. [facility]/ [address] [place] , [place]

## 2013-02-26 NOTE — Telephone Encounter (Signed)
Call in #[phone] with 5 rf 

## 2013-03-01 MED ORDER — PHENTERMINE HCL 37.5 MG PO CAPS: 37.5000 mg | ORAL_CAPSULE | ORAL | Status: DC

## 2013-03-01 NOTE — Telephone Encounter (Signed)
I called in script and left a voice message for pt. 

## 2013-04-01 ENCOUNTER — Ambulatory Visit (INDEPENDENT_AMBULATORY_CARE_PROVIDER_SITE_OTHER): Payer: Managed Care, Other (non HMO) | Admitting: Adult Health

## 2013-04-01 ENCOUNTER — Encounter: Payer: Self-pay | Admitting: Adult Health

## 2013-04-01 ENCOUNTER — Encounter (INDEPENDENT_AMBULATORY_CARE_PROVIDER_SITE_OTHER): Payer: Self-pay

## 2013-04-01 ENCOUNTER — Other Ambulatory Visit (HOSPITAL_COMMUNITY)
Admission: RE | Admit: 2013-04-01 | Discharge: 2013-04-01 | Disposition: A | Discharge status: Home / Self Care | Payer: Managed Care, Other (non HMO) | Source: Ambulatory Visit | Attending: Obstetrics and Gynecology | Admitting: Obstetrics and Gynecology

## 2013-04-01 VITALS — BP 110/72 | HR 78 | Ht 64.0 in | Wt 230.0 lb

## 2013-04-01 DIAGNOSIS — Z113 Encounter for screening for infections with a predominantly sexual mode of transmission: Secondary | ICD-10-CM

## 2013-04-01 DIAGNOSIS — Z01419 Encounter for gynecological examination (general) (routine) without abnormal findings: Secondary | ICD-10-CM

## 2013-04-01 DIAGNOSIS — N926 Irregular menstruation, unspecified: Secondary | ICD-10-CM

## 2013-04-01 DIAGNOSIS — Z975 Presence of (intrauterine) contraceptive device: Secondary | ICD-10-CM

## 2013-04-01 LAB — RPR

## 2013-04-01 MED ORDER — DOXYCYCLINE HYCLATE 100 MG PO CAPS: 100.0000 mg | ORAL_CAPSULE | Freq: Two times a day (BID) | ORAL | Status: DC

## 2013-04-01 NOTE — Progress Notes (Signed)
Patient ID: Audrey [name], female   DOB: [birth year], 27 y.o.   MRN: [id number] History of Present Illness: Audrey [name] is a 27 year old white female in for a pap and physical.She is having bleeding with sex, if uses Metrogel is Ok for about 4 days, no pain.Wants STD testing ex boyfriend cheated.   Current Medications, Allergies, Past Medical History, Past Surgical History, Family History and Social History were reviewed in [employer] record.     Review of Systems: Patient denies any daily headaches, blurred vision, shortness of breath, chest pain, abdominal pain, problems with bowel movements, urination, or joints.No mood swings, but has bleeding with sex.Has IUD.     Physical Exam:BP 110/72  Pulse 78  Ht 5\' 4"  (1.626 m)  Wt 230 lb (104.327 kg)  BMI 39.46 kg/m2  LMP 03/03/2013 General:  Well developed, well nourished, no acute distress Skin:  Warm and dry Neck:  Midline trachea, normal thyroid Lungs; Clear to auscultation bilaterally Breast:  No dominant palpable mass, retraction, or nipple discharge Cardiovascular: Regular rate and rhythm Abdomen:  Soft, non tender, no hepatosplenomegaly Pelvic:  External genitalia is normal in appearance.  The vagina is normal in appearance.  The cervix is bulbous, +IUD strings seen, cervix is friable, pap performed and GC/CHL done too.  Uterus is felt to be normal size, shape, and contour.  No adnexal masses or tenderness noted. Extremities:  No swelling or varicosities noted Psych:  No mood changes, alert and cooperative   Impression: Yearly exam  STD testing Bleeding with sex    Plan: Check HIV,RPR,HEPT B&C and HSV2 Rx doxycyline 100 mg 1 bid x 10 days, no sex Follow up in 2 weeks Physical in 1 year

## 2013-04-01 NOTE — Patient Instructions (Signed)
Physical in 1 year Follow up in 2 weeks for recheck Call for labs

## 2013-04-02 ENCOUNTER — Telehealth: Payer: Self-pay | Admitting: Adult Health

## 2013-04-02 LAB — GC/CHLAMYDIA PROBE AMP
CT Probe RNA: NEGATIVE
GC Probe RNA: NEGATIVE

## 2013-04-02 LAB — HSV 2 ANTIBODY, IGG: HSV 2 Glycoprotein G Ab, IgG: <0.1 IV

## 2013-04-02 NOTE — Telephone Encounter (Signed)
Pt aware labs negative.  

## 2013-04-05 ENCOUNTER — Telehealth: Payer: Self-pay | Admitting: Obstetrics and Gynecology

## 2013-04-05 NOTE — Telephone Encounter (Signed)
Pt aware all labs and pap was normal. [name]

## 2013-04-15 ENCOUNTER — Ambulatory Visit: Payer: Managed Care, Other (non HMO) | Admitting: Adult Health

## 2013-04-21 ENCOUNTER — Telehealth: Payer: Self-pay | Admitting: Family Medicine

## 2013-04-21 MED ORDER — PERMETHRIN 5 % EX CREA: 1.0000 "application " | TOPICAL_CREAM | Freq: Once | CUTANEOUS | Status: DC

## 2013-04-21 NOTE — Telephone Encounter (Signed)
Pt daughters has lice head and pt needs rx call into [facility] [phone].

## 2013-04-21 NOTE — Telephone Encounter (Signed)
Call in 5% permethrin cream to apply to the scalp overnight and wash off the next morning. Call in one bottle with 2 rf

## 2013-04-21 NOTE — Telephone Encounter (Signed)
I sent script e-scribe and spoke with pt. 

## 2013-04-29 ENCOUNTER — Ambulatory Visit: Payer: Managed Care, Other (non HMO) | Admitting: Adult Health

## 2013-05-10 ENCOUNTER — Telehealth: Payer: Self-pay | Admitting: Adult Health

## 2013-05-10 NOTE — Telephone Encounter (Signed)
Still has bleeding after sex but the doxy and metrogel helps for like a week, to come in 12/18 at 8:30 am to be seen

## 2013-05-13 ENCOUNTER — Other Ambulatory Visit: Payer: Self-pay | Admitting: Adult Health

## 2013-05-13 ENCOUNTER — Encounter: Payer: Self-pay | Admitting: Adult Health

## 2013-05-13 ENCOUNTER — Ambulatory Visit (INDEPENDENT_AMBULATORY_CARE_PROVIDER_SITE_OTHER): Payer: Managed Care, Other (non HMO)

## 2013-05-13 ENCOUNTER — Ambulatory Visit (INDEPENDENT_AMBULATORY_CARE_PROVIDER_SITE_OTHER): Payer: Managed Care, Other (non HMO) | Admitting: Adult Health

## 2013-05-13 ENCOUNTER — Encounter (INDEPENDENT_AMBULATORY_CARE_PROVIDER_SITE_OTHER): Payer: Self-pay

## 2013-05-13 VITALS — BP 122/80 | Ht 65.0 in | Wt 230.0 lb

## 2013-05-13 DIAGNOSIS — N930 Postcoital and contact bleeding: Secondary | ICD-10-CM

## 2013-05-13 HISTORY — DX: Postcoital and contact bleeding: N93.0

## 2013-05-13 MED ORDER — DOXYCYCLINE HYCLATE 100 MG PO CAPS: 100.0000 mg | ORAL_CAPSULE | Freq: Two times a day (BID) | ORAL | Status: DC

## 2013-05-13 NOTE — Patient Instructions (Signed)
Take doxy bid x 14 days NO SEX Follow up on 12/30

## 2013-05-13 NOTE — Progress Notes (Signed)
Subjective:     Patient ID: Audrey [name], female   DOB: 09/24/1985, 27 y.o.   MRN: [id number]  HPI Audrey [name] is a 27 year old white female, married with IUD,  having bleeding after sex.We tried metrogel with no change and then doxycycline x 10 days and she did not bleed for several days.So I had her have sex and come in today to be evaluated and she has bleeding.IUD not due to be removed til 09/2013.Her periods ate 2 days and light but the postcoital bleeding is heavy.  Review of Systems See HPI Reviewed past medical,surgical, social and family history. Reviewed medications and allergies.     Objective:   Physical Exam BP 122/80  Ht 5\' 5"  (1.651 m)  Wt 230 lb (104.327 kg)  BMI 38.27 kg/m2  LMP 04/29/2013   On exam, external genitalia normal, the vagina has bright red blood with clots, cervix non tender, uterus NSSC, non tender, got [place] to assess and IUD in place, endometrium 6.7, no masses, ovaries normal. Discussed with Dr Emelda Fear by phone, will try doxy again and recheck in 10 days.  Assessment:     Postcoital bleeding    Plan:    Rx doxycycline 100 mg 1 bid x 14 days  NO sex Follow up 05/25/13, will check strings and cervix then for any bleeding with Q tip

## 2013-05-25 ENCOUNTER — Ambulatory Visit: Payer: Managed Care, Other (non HMO) | Admitting: Adult Health

## 2013-06-01 ENCOUNTER — Telehealth: Payer: Self-pay | Admitting: Family Medicine

## 2013-06-01 NOTE — Telephone Encounter (Signed)
Attempted calling pt at [phone] and no answer.  Message left.

## 2013-06-03 ENCOUNTER — Ambulatory Visit: Payer: Managed Care, Other (non HMO) | Admitting: Adult Health

## 2013-06-10 ENCOUNTER — Ambulatory Visit: Payer: Managed Care, Other (non HMO) | Admitting: Adult Health

## 2013-06-18 ENCOUNTER — Telehealth: Payer: Self-pay | Admitting: Family Medicine

## 2013-06-18 NOTE — Telephone Encounter (Signed)
Patient Information:  Caller Name: Audrey [name]  Phone: [phone]  Patient: Audrey [name], Audrey [name]  Gender: Female  DOB: Jul 01, 1985  Age: 28 Years  PCP: Gershon CraneFry, [facility](Family Practice)  Pregnant: No  Office Follow Up:  Does the office need to follow up with this patient?: No  Instructions For The Office: N/A   Symptoms  Reason For Call & Symptoms: Pt calling regarding a request for something for her nerves. States she was given some bad news by her mom yesterday and needs something to help calm her down. Pt declined triage or declined to discuss it. Also declined offer of appt to sit and talk with MD for possible written Rx. Explained that controlled substances could not be called in anymore, especially with out any new assessment of the need for one. Pt states she can not make it to office today. States she will go to UC close to her if she felt she had to.  Reviewed Health History In EMR: Yes  Reviewed Medications In EMR: Yes  Reviewed Allergies In EMR: Yes  Reviewed Surgeries / Procedures: Yes  Date of Onset of Symptoms: 06/17/2013 OB / GYN:  LMP: Unknown  Guideline(s) Used:  No Protocol Available - Information Only  Disposition Per Guideline:   Home Care  Reason For Disposition Reached:   Information only question and nurse able to answer  Advice Given:  Call Back If:  New symptoms develop  You become worse.  Patient Will Follow Care Advice:  YES

## 2013-06-18 NOTE — Telephone Encounter (Signed)
FYI

## 2013-07-08 ENCOUNTER — Ambulatory Visit (INDEPENDENT_AMBULATORY_CARE_PROVIDER_SITE_OTHER): Payer: 59 | Admitting: Obstetrics and Gynecology

## 2013-07-08 ENCOUNTER — Encounter: Payer: Self-pay | Admitting: Obstetrics and Gynecology

## 2013-07-08 VITALS — BP 120/84 | Ht 64.0 in | Wt 225.4 lb

## 2013-07-08 DIAGNOSIS — A499 Bacterial infection, unspecified: Secondary | ICD-10-CM

## 2013-07-08 DIAGNOSIS — N930 Postcoital and contact bleeding: Secondary | ICD-10-CM

## 2013-07-08 DIAGNOSIS — N898 Other specified noninflammatory disorders of vagina: Secondary | ICD-10-CM

## 2013-07-08 DIAGNOSIS — N760 Acute vaginitis: Secondary | ICD-10-CM

## 2013-07-08 DIAGNOSIS — B9689 Other specified bacterial agents as the cause of diseases classified elsewhere: Secondary | ICD-10-CM

## 2013-07-08 LAB — POCT WET PREP (WET MOUNT)
Clue Cells Wet Prep Whiff POC: POSITIVE
KOH Wet Prep POC: NEGATIVE
Trichomonas Wet Prep HPF POC: NEGATIVE
WBC WET PREP: POSITIVE

## 2013-07-08 MED ORDER — METRONIDAZOLE 500 MG PO TABS: 500.0000 mg | ORAL_TABLET | Freq: Two times a day (BID) | ORAL | Status: DC

## 2013-07-08 NOTE — Progress Notes (Signed)
This chart was transcribed for Dr. Christin BachJohn Dannie Woolen by Leone PayorSonum Patel, ED scribe.   [facility] Visit  Patient name: Audrey [name] MRN [id number]  Date of birth: 01-May-1986  CC & HPI:  Audrey [name] is a 28 y.o. female married with IUD, presenting today for vaginal bleeding during and after intercourse. She was last seen on 05/13/13 for the same symptoms. She has been prescribed doxycycline and Metrogel in the past without relief. She has not been attempting sexual activity to prevent another episode of vaginal bleeding.   IUD not due to be removed until 09/2013. She denies any problems with her IUD until these symptoms started in mid 2014.     ROS:  Negative except noted above  Pertinent History Reviewed:  Medical & Surgical Hx:  Reviewed: Significant for  Past Medical History  Diagnosis Date  . Chicken pox   . Migraines   . Allergy   . Hay fever   . UTI (urinary tract infection)   . Kidney stones   . Anxiety   . Irregular bleeding 12/30/2012  . BV (bacterial vaginosis) 12/30/2012  . Postcoital bleeding 05/13/2013    Had US and IUD in place, bleeding stopped after doxycycline but started back, has normal period lite x 2 days but bleeds every time with sex, is bright red and has clots and cramps after sex     Past Surgical History  Procedure Laterality Date  . Tonsillectomy    . Dilation and curettage of uterus      Medications: Reviewed & Updated - see associated section Social History: Reviewed -  reports that she has never smoked. She has never used smokeless tobacco.  Objective Findings:  Vitals: BP 120/84  Ht 5\' 4"  (1.626 m)  Wt 225 lb 6.4 oz (102.241 kg)  BMI 38.67 kg/m2  LMP 05/15/2013  Physical Examination: General appearance - alert, well appearing, and in no distress and oriented to person, place, and time Pelvic - VULVA: normal appearing vulva with no masses, tenderness or lesions,  VAGINA: normal appearing vagina with normal color and discharge, no lesions,   CERVIX: normal appearing cervix without discharge or lesions,  UTERUS: uterus is normal size, shape, consistency and nontender, anti-flexed with IUD confirmed in position with u/s (no charge),  ADNEXA: normal adnexa in size, nontender and no masses, 3.2 cm x 3.8 cm simple cyst on right ovary     Assessment & Plan:    A: 1. Post coital bleeding secondary to IUD position.  2. BV 3. Asymptomatic right ovarian simple cyst measuring 3.2 cm x 3.8 cm    P: 1. Metronidazole 500 BID x7 days.  2. Remove IUD next week.  3. Pt wishes to switch to depo at time of IUD removal.

## 2013-07-15 ENCOUNTER — Encounter: Payer: Self-pay | Admitting: Adult Health

## 2013-07-15 ENCOUNTER — Ambulatory Visit (INDEPENDENT_AMBULATORY_CARE_PROVIDER_SITE_OTHER): Payer: 59 | Admitting: Adult Health

## 2013-07-15 VITALS — BP 120/80 | Ht 64.0 in | Wt 227.0 lb

## 2013-07-15 DIAGNOSIS — Z30432 Encounter for removal of intrauterine contraceptive device: Secondary | ICD-10-CM

## 2013-07-15 DIAGNOSIS — N938 Other specified abnormal uterine and vaginal bleeding: Secondary | ICD-10-CM

## 2013-07-15 DIAGNOSIS — Z3202 Encounter for pregnancy test, result negative: Secondary | ICD-10-CM

## 2013-07-15 DIAGNOSIS — N949 Unspecified condition associated with female genital organs and menstrual cycle: Secondary | ICD-10-CM

## 2013-07-15 DIAGNOSIS — Z3049 Encounter for surveillance of other contraceptives: Secondary | ICD-10-CM

## 2013-07-15 DIAGNOSIS — Z309 Encounter for contraceptive management, unspecified: Secondary | ICD-10-CM

## 2013-07-15 HISTORY — DX: Encounter for contraceptive management, unspecified: Z30.9

## 2013-07-15 LAB — POCT URINE PREGNANCY: PREG TEST UR: NEGATIVE

## 2013-07-15 MED ORDER — MEDROXYPROGESTERONE ACETATE 150 MG/ML IM SUSP
150.0000 mg | Freq: Once | INTRAMUSCULAR | Status: AC
  Administered 2013-07-15: 150 mg via INTRAMUSCULAR

## 2013-07-15 MED ORDER — MEDROXYPROGESTERONE ACETATE 150 MG/ML IM SUSP: 150.0000 mg | INTRAMUSCULAR | Status: DC

## 2013-07-15 NOTE — Progress Notes (Signed)
Patient ID: Marcine MatarAmber F Massey, female   DOB: February 18, 1986, 28 y.o.   MRN: [id number] Depo Provera 150 mg IM given right deltoid, no complications. Resulted negative pregnancy test. Pt to return in 12 weeks for next injection.

## 2013-07-15 NOTE — Progress Notes (Signed)
Subjective:     Patient ID: Audrey MatarAmber F [name], female   DOB: 1986-02-25, 28 y.o.   MRN: [id number]  HPI Audrey [name] is a 28 year old white female in for IUD removal due to spotting.And she wants to get on depo.  Review of Systems See HPI Reviewed past medical,surgical, social and family history. Reviewed medications and allergies.     Objective:   Physical Exam BP 120/80  Ht 5\' 4"  (1.626 m)  Wt 227 lb (102.967 kg)  BMI 38.95 kg/m2  LMP 05/15/2013   Verbal consent obtained and IUD easily removed with forceps, still spotting, even after flagyl, no tenderness or masses felt on exam.  Assessment:     IUD removal Contraceptive management    Plan:     Rx depo provera 150 mg #1 vial for IM injection every 12 weeks in office with 4 refills   Return this pm for first depo Use condoms for 2-3 weeks Review handout on depo provera

## 2013-07-15 NOTE — Patient Instructions (Signed)

## 2013-07-15 NOTE — Assessment & Plan Note (Signed)
IUD removed 07/15/13 will rx depo

## 2013-07-16 ENCOUNTER — Telehealth: Payer: Self-pay | Admitting: Adult Health

## 2013-07-16 NOTE — Telephone Encounter (Signed)
Had IUD removed yesterday and got first depo bleeding heavy today, told her this would be like a period, should get better.

## 2013-07-28 ENCOUNTER — Encounter: Payer: Self-pay | Admitting: Adult Health

## 2013-07-28 ENCOUNTER — Telehealth: Payer: Self-pay | Admitting: Adult Health

## 2013-07-28 ENCOUNTER — Ambulatory Visit (INDEPENDENT_AMBULATORY_CARE_PROVIDER_SITE_OTHER): Payer: 59 | Admitting: Adult Health

## 2013-07-28 VITALS — BP 114/84 | Ht 64.0 in | Wt 221.0 lb

## 2013-07-28 DIAGNOSIS — N926 Irregular menstruation, unspecified: Secondary | ICD-10-CM

## 2013-07-28 DIAGNOSIS — N930 Postcoital and contact bleeding: Secondary | ICD-10-CM

## 2013-07-28 LAB — COMPREHENSIVE METABOLIC PANEL
ALBUMIN: 4.7 g/dL (ref 3.5–5.2)
ALT: 14 U/L (ref 0–35)
AST: 12 U/L (ref 0–37)
Alkaline Phosphatase: 56 U/L (ref 39–117)
BUN: 14 mg/dL (ref 6–23)
CALCIUM: 9.3 mg/dL (ref 8.4–10.5)
CHLORIDE: 108 meq/L (ref 96–112)
CO2: 24 meq/L (ref 19–32)
CREATININE: 0.76 mg/dL (ref 0.50–1.10)
GLUCOSE: 85 mg/dL (ref 70–99)
POTASSIUM: 4 meq/L (ref 3.5–5.3)
Sodium: 141 mEq/L (ref 135–145)
Total Bilirubin: 0.4 mg/dL (ref 0.2–1.2)
Total Protein: 6.9 g/dL (ref 6.0–8.3)

## 2013-07-28 LAB — CBC
HEMATOCRIT: 40.6 % (ref 36.0–46.0)
HEMOGLOBIN: 13.9 g/dL (ref 12.0–15.0)
MCH: 30.4 pg (ref 26.0–34.0)
MCHC: 34.2 g/dL (ref 30.0–36.0)
MCV: 88.8 fL (ref 78.0–100.0)
Platelets: 346 10*3/uL (ref 150–400)
RBC: 4.57 MIL/uL (ref 3.87–5.11)
RDW: 13.4 % (ref 11.5–15.5)
WBC: 4.9 10*3/uL (ref 4.0–10.5)

## 2013-07-28 MED ORDER — MEGESTROL ACETATE 40 MG PO TABS: ORAL_TABLET | ORAL | Status: DC

## 2013-07-28 NOTE — Progress Notes (Signed)
Subjective:     Patient ID: Audrey [name], female   DOB: [birth year], 28 y.o.   MRN: [id number]  HPI Audrey [name] is a 28 year old white female, married, in complaining of irregular bleeding and bleeding after sex.Had IUD removed and got on depo.  Review of Systems See HPI Reviewed past medical,surgical, social and family history. Reviewed medications and allergies.     Objective:   Physical Exam BP 114/84  Ht 5\' 4"  (1.626 m)  Wt 221 lb (100.245 kg)  BMI 37.92 kg/m2  LMP 07/16/2013   Skin warm and dry.Pelvic: external genitalia is normal in appearance, vagina: pinkish to red discharge without odor, cervix:smooth and bulbous, uterus: normal size, shape and contour, non tender, no masses felt, adnexa: no masses or tenderness noted.  Assessment:     Irregular bleeding PCB    Plan:     Check CBC,CMP,TSH Rx megace 40 mg take 3 x 5 days then 2 x 5 days then 1 daily Return in 2 weeks No sex Review handout on DUB

## 2013-07-28 NOTE — Patient Instructions (Signed)

## 2013-07-29 LAB — TSH: TSH: 1.528 u[IU]/mL (ref 0.350–4.500)

## 2013-07-29 NOTE — Telephone Encounter (Signed)
Called pt with lab results,left message they were normal

## 2013-08-12 ENCOUNTER — Ambulatory Visit (INDEPENDENT_AMBULATORY_CARE_PROVIDER_SITE_OTHER): Payer: 59 | Admitting: Adult Health

## 2013-08-12 ENCOUNTER — Encounter (INDEPENDENT_AMBULATORY_CARE_PROVIDER_SITE_OTHER): Payer: Self-pay

## 2013-08-12 ENCOUNTER — Encounter: Payer: Self-pay | Admitting: Adult Health

## 2013-08-12 VITALS — BP 120/76 | Ht 64.0 in | Wt 220.0 lb

## 2013-08-12 DIAGNOSIS — N930 Postcoital and contact bleeding: Secondary | ICD-10-CM

## 2013-08-12 DIAGNOSIS — N72 Inflammatory disease of cervix uteri: Secondary | ICD-10-CM

## 2013-08-12 NOTE — Progress Notes (Signed)
Subjective:     Patient ID: Audrey [name], female   DOB: 1985-08-09, 28 y.o.   MRN: [id number]  HPI Audrey [name] is a 28 year old white female in with PCB.Had IUD removed on depo and then used megace to stop bleeding which helped then had sex and had bleeding and cramping.  Review of Systems See HPI Reviewed past medical,surgical, social and family history. Reviewed medications and allergies.     Objective:   Physical Exam BP 120/76  Ht 5\' 4"  (1.626 m)  Wt 220 lb (99.791 kg)  BMI 37.74 kg/m2  LMP 07/16/2013   Skin warm and dry.Pelvic: external genitalia is normal in appearance, vagina: pink discharge without odor, cervix is everted and red beefy and friable,and crampy when touched, uterus: normal size, shape and contour, non tender, no masses felt, adnexa: no masses or tenderness noted.Discused with Dr Emelda FearFerguson, thinking cryo of cervix may help.  Assessment:     PCB Cervicitis     Plan:       Return in 2 week for cryo with Dr Emelda FearFerguson Review handouts

## 2013-08-12 NOTE — Patient Instructions (Signed)
Return in 2 weeks for cryo Cryotherapy Cryotherapy means treatment with cold. Ice or gel packs can be used to reduce both pain and swelling. Ice is the most helpful within the first 24 to 48 hours after an injury or flareup from overusing a muscle or joint. Sprains, strains, spasms, burning pain, shooting pain, and aches can all be eased with ice. Ice can also be used when recovering from surgery. Ice is effective, has very few side effects, and is safe for most people to use. PRECAUTIONS  Ice is not a safe treatment option for people with: Raynaud's phenomenon. This is a condition affecting small blood vessels in the extremities. Exposure to cold may cause your problems to return. Cold hypersensitivity. There are many forms of cold hypersensitivity, including: Cold urticaria. Red, itchy hives appear on the skin when the tissues begin to warm after being iced. Cold erythema. This is a red, itchy rash caused by exposure to cold. Cold hemoglobinuria. Red blood cells break down when the tissues begin to warm after being iced. The hemoglobin that carry oxygen are passed into the urine because they cannot combine with blood proteins fast enough. Numbness or altered sensitivity in the area being iced. If you have any of the following conditions, do not use ice until you have discussed cryotherapy with your caregiver: Heart conditions, such as arrhythmia, angina, or chronic heart disease. High blood pressure. Healing wounds or open skin in the area being iced. Current infections. Rheumatoid arthritis. Poor circulation. Diabetes. Ice slows the blood flow in the region it is applied. This is beneficial when trying to stop inflamed tissues from spreading irritating chemicals to surrounding tissues. However, if you expose your skin to cold temperatures for too long or without the proper protection, you can damage your skin or nerves. Watch for signs of skin damage due to cold. HOME CARE INSTRUCTIONS Follow  these tips to use ice and cold packs safely. Place a dry or damp towel between the ice and skin. A damp towel will cool the skin more quickly, so you may need to shorten the time that the ice is used. For a more rapid response, add gentle compression to the ice. Ice for no more than 10 to 20 minutes at a time. The bonier the area you are icing, the less time it will take to get the benefits of ice. Check your skin after 5 minutes to make sure there are no signs of a poor response to cold or skin damage. Rest 20 minutes or more in between uses. Once your skin is numb, you can end your treatment. You can test numbness by very lightly touching your skin. The touch should be so light that you do not see the skin dimple from the pressure of your fingertip. When using ice, most people will feel these normal sensations in this order: cold, burning, aching, and numbness. Do not use ice on someone who cannot communicate their responses to pain, such as small children or people with dementia. HOW TO MAKE AN ICE PACK Ice packs are the most common way to use ice therapy. Other methods include ice massage, ice baths, and cryo-sprays. Muscle creams that cause a cold, tingly feeling do not offer the same benefits that ice offers and should not be used as a substitute unless recommended by your caregiver. To make an ice pack, do one of the following: Place crushed ice or a bag of frozen vegetables in a sealable plastic bag. Squeeze out the excess air.  Place this bag inside another plastic bag. Slide the bag into a pillowcase or place a damp towel between your skin and the bag. Mix 3 parts water with 1 part rubbing alcohol. Freeze the mixture in a sealable plastic bag. When you remove the mixture from the freezer, it will be slushy. Squeeze out the excess air. Place this bag inside another plastic bag. Slide the bag into a pillowcase or place a damp towel between your skin and the bag. SEEK MEDICAL CARE IF: You develop  white spots on your skin. This may give the skin a blotchy (mottled) appearance. Your skin turns blue or pale. Your skin becomes waxy or hard. Your swelling gets worse. MAKE SURE YOU:  Understand these instructions. Will watch your condition. Will get help right away if you are not doing well or get worse. Document Released: 01/07/2011 Document Revised: 08/05/2011 Document Reviewed: 01/07/2011 [facility]Exit[facility] Patient Information 2014 [place], [place]. Cervicitis Cervicitis is a soreness and swelling (inflammation) of the cervix. Your cervix is located at the bottom of your uterus. It opens up to the vagina. CAUSES   Sexually transmitted infections (STIs).   Allergic reaction.   Medicines or birth control devices that are put in the vagina.   Injury to the cervix.   Bacterial infections.  RISK FACTORS You are at greater risk if you:  Have unprotected sexual intercourse.  Have sexual intercourse with many partners.  Began sexual intercourse at an early age.  Have a history of STIs. SYMPTOMS  There may be no symptoms. If symptoms occur, they may include:   Grey, white, yellow, or bad-smelling vaginal discharge.   Pain or itching of the area outside the vagina.   Painful sexual intercourse.   Lower abdominal or lower back pain, especially during intercourse.   Frequent urination.   Abnormal vaginal bleeding between periods, after sexual intercourse, or after menopause.   Pressure or a heavy feeling in the pelvis.  DIAGNOSIS  Diagnosis is made after a pelvic exam. Other tests may include:   Examination of any discharge under a microscope (wet prep).   A Pap test.  TREATMENT  Treatment will depend on the cause of cervicitis. If it is caused by an STI, both you and your partner will need to be treated. Antibiotic medicines will be given.  HOME CARE INSTRUCTIONS   Do not have sexual intercourse until your health care provider says it is okay.   Do not have  sexual intercourse until your partner has been treated, if your cervicitis is caused by an STI.   Take your antibiotics as directed. Finish them even if you start to feel better.  SEEK MEDICAL CARE IF:  Your symptoms come back.   You have a fever.  MAKE SURE YOU:   Understand these instructions.  Will watch your condition.  Will get help right away if you are not doing well or get worse. Document Released: 05/13/2005 Document Revised: 01/13/2013 Document Reviewed: 11/04/2012 [facility]ExitCare Patient Information 2014 [place]Exit[place], [place].

## 2013-08-16 ENCOUNTER — Other Ambulatory Visit: Payer: Self-pay | Admitting: Adult Health

## 2013-08-16 ENCOUNTER — Telehealth: Payer: Self-pay | Admitting: Adult Health

## 2013-08-16 NOTE — Telephone Encounter (Signed)
Pt thought she had a refill on doxycycline, thinks she may have an infection, pt having a test next week and wants to know if she should take an antibiotic. What should she do?

## 2013-08-16 NOTE — Telephone Encounter (Signed)
Complains of brown discharge with odor, to come in in am at 8:30

## 2013-08-17 ENCOUNTER — Ambulatory Visit: Payer: 59 | Admitting: Adult Health

## 2013-08-26 ENCOUNTER — Ambulatory Visit (INDEPENDENT_AMBULATORY_CARE_PROVIDER_SITE_OTHER): Payer: 59 | Admitting: Obstetrics and Gynecology

## 2013-08-26 ENCOUNTER — Encounter: Payer: Self-pay | Admitting: Obstetrics and Gynecology

## 2013-08-26 VITALS — BP 118/70 | Ht 64.0 in | Wt 215.8 lb

## 2013-08-26 DIAGNOSIS — N898 Other specified noninflammatory disorders of vagina: Secondary | ICD-10-CM

## 2013-08-26 DIAGNOSIS — Z3202 Encounter for pregnancy test, result negative: Secondary | ICD-10-CM

## 2013-08-26 DIAGNOSIS — R300 Dysuria: Secondary | ICD-10-CM

## 2013-08-26 DIAGNOSIS — N72 Inflammatory disease of cervix uteri: Secondary | ICD-10-CM

## 2013-08-26 LAB — POCT URINALYSIS DIPSTICK
GLUCOSE UA: NEGATIVE
Ketones, UA: NEGATIVE
NITRITE UA: NEGATIVE
PROTEIN UA: NEGATIVE

## 2013-08-26 LAB — POCT WET PREP WITH KOH
BACTERIA WET PREP HPF POC: NEGATIVE
CLUE CELLS WET PREP PER HPF POC: NEGATIVE
Epithelial Wet Prep HPF POC: NORMAL
KOH PREP POC: NEGATIVE
RBC WET PREP PER HPF POC: NEGATIVE
TRICHOMONAS UA: NEGATIVE
Yeast Wet Prep HPF POC: NEGATIVE

## 2013-08-26 LAB — POCT URINE PREGNANCY: PREG TEST UR: NEGATIVE

## 2013-08-26 MED ORDER — METRONIDAZOLE 0.75 % VA GEL: 1.0000 | Freq: Every day | VAGINAL | Status: DC

## 2013-08-26 NOTE — Addendum Note (Signed)
Addended by: Criss AlvinePULLIAM, Eero Dini G on: 08/26/2013 03:00 PM   Modules accepted: Orders

## 2013-08-26 NOTE — Patient Instructions (Addendum)
Cryotherapy Cryotherapy is when you put ice on your injury. Ice helps lessen pain and puffiness (swelling) after an injury. Ice works the best when you start using it in the first 24 to 48 hours after an injury. HOME CARE  Put a dry or damp towel between the ice pack and your skin.  You may press gently on the ice pack.  Leave the ice on for no more than 10 to 20 minutes at a time.  Check your skin after 5 minutes to make sure your skin is okay.  Rest at least 20 minutes between ice pack uses.  Stop using ice when your skin loses feeling (numbness).  HOW TO MAKE AN ICE PACK Ice packs are the most common way to use ice therapy. Other methods include ice massage, ice baths, and cryo-sprays. Muscle creams that cause a cold, tingly feeling do not offer the same benefits that ice offers and should not be used as a substitute unless recommended by your caregiver. To make an ice pack, do one of the following:  Place crushed ice or a bag of frozen vegetables in a sealable plastic bag. Squeeze out the excess air. Place this bag inside another plastic bag. Slide the bag into a pillowcase or place a damp towel between your skin and the bag.  Mix 3 parts water with 1 part rubbing alcohol. Freeze the mixture in a sealable plastic bag. When you remove the mixture from the freezer, it will be slushy. Squeeze out the excess air. Place this bag inside another plastic bag. Slide the bag into a pillowcase or place a damp towel between your skin and the bag. Don't put anything inside the vagina for the next three weeks. You may also experience a watery discharge. Wear pads for comfort if necessary.  GET HELP RIGHT AWAY IF:  You have white spots on your skin.  Your skin turns blue or pale.  Your skin feels waxy or hard.  Your puffiness gets worse. MAKE SURE YOU:   Understand these instructions.  Will watch your condition.  Will get help right away if you are not doing well or get worse. Document  Released: 10/30/2007 Document Revised: 08/05/2011 Document Reviewed: 01/03/2011 [facility]Exit[facility] Patient Information 2014 [place]ExitCare, [place]LL[place].

## 2013-08-26 NOTE — Progress Notes (Addendum)
This chart was scribed by Bennett Scrapehristina Taylor, Medical Scribe, for Dr. Christin BachJohn Ndeye Tenorio on 08/26/13 at 2:18 PM. This chart was reviewed by Dr. Christin BachJohn Reizel Calzada and is accurate.   Patient ID: Audrey [name], female   DOB: Nov 21, 1985, 28 y.o.   MRN: [id number]  Dx with cervicitis. IUD taken out, given Depo and started on Megace. Started bleeding after stopping Megace. G3P3.  C/o dysuria intermittently x2 days. Tried AZO w/o improvement.  UA done today: shows   PAP on 04/01/13 NEGATIVE FOR INTRAEPITHELIAL LESIONS OR MALIGNANCY.  GYNECOLOGY CLINIC PROCEDURE NOTE  Cryotherapy details The indications for cryotherapy were reviewed with the patient in detail. 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 The risks of the procedure where explained in detail and patient was told to expect a copious amount of discharge in the next few weeks. All her questions were answered, and written informed consent was obtained.  The patient was placed in the dorsal lithotomy position and a vaginal speculum was placed. Her cervix was visualized and patient was noted to have had normal size transformation zone. The appropriate cryotherapy probe was picked and affixed to cryotherapy apparatus. Then nitrogen gas was then activated, the probe was coated with lubricating jelly and applied to the transformation zone of the cervix. This was kept in place for 3 minutes. The cryotherapy was then stopped and all instruments were removed from the patient's pelvis; a thawing period of 3 minutes was observed.  A second cycle of cryotherapy was then administered to the cervix for 3 minutes.  The patient tolerated the procedure well without any complications. Routine post procedure instructions were given to the patient.  Will repeat pap smear in 6 months and manage accordingly.  A: 1. Cervicitis from cervical eversion 2. Cryotherapy  P: 1.  Metrogel Refill x1 2. Recheck in 6 weeks  Christin BachJohn Jalaysha Skilton. MD Pgr [phone] 2:27 PM

## 2013-08-26 NOTE — Addendum Note (Signed)
Addended by: [name], Terrance Usery V on: 08/26/2013 02:46 PM   Modules accepted: Orders, Level of Service

## 2013-09-03 ENCOUNTER — Telehealth: Payer: Self-pay | Admitting: Obstetrics and Gynecology

## 2013-09-03 NOTE — Telephone Encounter (Signed)
Spoke with pt. Pt had cryotherapy on 08/26/13. Had a watery discharge up until Tuesday when she started using Metrogel. Pt started bleeding after that. I advised everything sounds normal, but I would send Dr. Emelda FearFerguson a note. Spoke with Dr. Emelda FearFerguson. He advised she has a scab now and that will break off so she will have a raw surface but Metrogel will help heal faster. Bleeding is normal after this procedure. Pt voiced understanding. [name]

## 2013-09-17 ENCOUNTER — Telehealth: Payer: Self-pay | Admitting: Family Medicine

## 2013-09-17 NOTE — Telephone Encounter (Signed)
Pt would like to come in for mole removal. Pt has moles on neck. Pt needs thurs nextt avail 30 min is June 11-15. Pt does not want to wait until June 11-15. Can I create 30 min appt on Thursday?

## 2013-09-17 NOTE — Telephone Encounter (Signed)
Per Dr. Clent RidgesFry, pt needs a visit to evaluate first, then discuss removal.

## 2013-09-20 NOTE — Telephone Encounter (Signed)
Lmom for pt to c/b

## 2013-09-21 NOTE — Telephone Encounter (Signed)
lmom for pt to cb

## 2013-09-22 NOTE — Telephone Encounter (Signed)
lmom for pt to cb

## 2013-10-07 ENCOUNTER — Ambulatory Visit: Payer: 59

## 2013-10-07 ENCOUNTER — Telehealth: Payer: Self-pay | Admitting: Obstetrics and Gynecology

## 2013-10-07 ENCOUNTER — Encounter: Payer: Self-pay | Admitting: Obstetrics and Gynecology

## 2013-10-07 ENCOUNTER — Ambulatory Visit (INDEPENDENT_AMBULATORY_CARE_PROVIDER_SITE_OTHER): Payer: 59 | Admitting: Obstetrics and Gynecology

## 2013-10-07 VITALS — BP 110/72 | Ht 63.0 in | Wt 209.0 lb

## 2013-10-07 DIAGNOSIS — Z309 Encounter for contraceptive management, unspecified: Secondary | ICD-10-CM

## 2013-10-07 DIAGNOSIS — N898 Other specified noninflammatory disorders of vagina: Secondary | ICD-10-CM

## 2013-10-07 DIAGNOSIS — Z3201 Encounter for pregnancy test, result positive: Secondary | ICD-10-CM

## 2013-10-07 DIAGNOSIS — Z3049 Encounter for surveillance of other contraceptives: Secondary | ICD-10-CM

## 2013-10-07 DIAGNOSIS — Z3200 Encounter for pregnancy test, result unknown: Secondary | ICD-10-CM

## 2013-10-07 HISTORY — DX: Other specified noninflammatory disorders of vagina: N89.8

## 2013-10-07 LAB — POCT URINE PREGNANCY: PREG TEST UR: POSITIVE

## 2013-10-07 LAB — HCG, QUANTITATIVE, PREGNANCY: hCG, Beta Chain, Quant, S: <1 m[IU]/mL

## 2013-10-07 MED ORDER — MEDROXYPROGESTERONE ACETATE 150 MG/ML IM SUSP: 150.0000 mg | Freq: Once | INTRAMUSCULAR | Status: DC

## 2013-10-07 NOTE — Progress Notes (Signed)
This note was scribed for Christin BachJohn Nguyen Todorov, MD, by Bennett Scrapehristina Taylor, Medical Scribe on 10/07/13 at 11:15 AM. The information in this note was reviewed by Christin BachJohn Ladarren Steiner, MD, and is accurate.    [facility] Visit  Patient name: Audrey [name] MRN [id number]  Date of birth: 01/17/1986  CC & HPI:  Audrey [name] is a 28 y.o. female presenting today for got IUD out and Depo injection in February. Was seen for cryotherapy 08/26/13, negative pregnancy test at the time. Began having sexual activity 3 weeks ago w/o protection. Came to have second Depo injection today and had 3 positive urine pregnancy tests. [id number]. Has been on phentermine 3 days a week.  ROS:  10 lb weight loss No complaints  Pertinent History Reviewed:  Medical & Surgical Hx:  Reviewed: Significant for tonsillectomy, D&C, cryotherapy  Medications: Reviewed & Updated - see associated section Social History: Reviewed -  reports that she has never smoked. She has never used smokeless tobacco.  Objective Findings:  Vitals: BP 110/72  Ht 5\' 3"  (1.6 m)  Wt 209 lb (94.802 kg)  BMI 37.03 kg/m2 Chaperone present for exam which was performed with pt's permission Physical Examination: General appearance - alert, well appearing, and in no distress and oriented to person, place, and time Mental status - alert, oriented to person, place, and time, normal mood, behavior, speech, dress, motor activity, and thought processes Pelvic - VULVA: normal appearing vulva with no masses, tenderness or lesions, VAGINA: normal appearing vagina with normal color and discharge, no lesions, CERVIX:  well-healed from cryotherapy, long, closed UTERUS: uterus is normal size, shape, consistency and nontender, ADNEXA: normal adnexa in size, nontender and no masses   Assessment & Plan:  A: 1. Resolved cervical eversion s/p cryotherapy  2. Confirmed pregnancy  P: 1. Stop phentermine  2. F/u prenatal 1 visit in 2 weeks   Addendum: Pt's preg quant was  negative. Will come in next week for Depo. Is requesting something in the meantime for birthcontrol  *note recreated due to incorrect sharing/saving

## 2013-10-07 NOTE — Telephone Encounter (Signed)
Pt informed of negative QHCG, requesting to be changed to the pill instead of Depo provera. Pt informed will route message to Dr. Emelda FearFerguson.

## 2013-10-07 NOTE — Progress Notes (Signed)
Patient ID: Audrey [name], female   DOB: 08/05/1985, 28 y.o.   MRN: [id number] Pt here for follow up visit and to get DEPO injection. Pt's UPT was positive x3. [facility] for baseline labs.

## 2013-10-07 NOTE — Progress Notes (Deleted)
This note was scribed for Christin BachJohn Ferguson, MD, by Bennett Scrapehristina Taylor, Medical Scribe on 10/07/13 at 11:15 AM. The information in this note was reviewed by Christin BachJohn Ferguson, MD, and is accurate.    [facility] Visit  Patient name: Audrey [name] MRN [id number]  Date of birth: [birth year]  CC & HPI:  Audrey [name] is a 28 y.o. female presenting today for got IUD out and Depo injection in February. Was seen for cryotherapy 08/26/13, negative pregnancy test at the time. Began having sexual activity 3 weeks ago w/o protection. Came to have second Depo injection today and had 3 positive urine pregnancy tests. [id number]. Has been on phentermine 3 days a week.  ROS:  10 lb weight loss No complaints  Pertinent History Reviewed:  Medical & Surgical Hx:  Reviewed: Significant for tonsillectomy, D&C, cryotherapy  Medications: Reviewed & Updated - see associated section Social History: Reviewed -  reports that she has never smoked. She has never used smokeless tobacco.  Objective Findings:  Vitals: BP 110/72   Ht 5\' 3"  (1.6 m)   Wt 209 lb (94.802 kg)   BMI 37.03 kg/m2 Chaperone present for exam which was performed with pt's permission Physical Examination: General appearance - alert, well appearing, and in no distress and oriented to person, place, and time Mental status - alert, oriented to person, place, and time, normal mood, behavior, speech, dress, motor activity, and thought processes Pelvic - VULVA: normal appearing vulva with no masses, tenderness or lesions, VAGINA: normal appearing vagina with normal color and discharge, no lesions, CERVIX:  well-healed from cryotherapy, long, closed UTERUS: uterus is normal size, shape, consistency and nontender, ADNEXA: normal adnexa in size, nontender and no masses   Assessment & Plan:  A: 1. Resolved cervical eversion s/p cryotherapy  2. Confirmed pregnancy  P: 1. Stop phentermine  2. F/u prenatal 1 visit in 2 weeks   *note deleted due to  incorrect sharing/saving

## 2013-10-08 ENCOUNTER — Encounter: Payer: Self-pay | Admitting: Obstetrics & Gynecology

## 2013-10-08 ENCOUNTER — Ambulatory Visit (INDEPENDENT_AMBULATORY_CARE_PROVIDER_SITE_OTHER): Payer: 59 | Admitting: Obstetrics & Gynecology

## 2013-10-08 ENCOUNTER — Other Ambulatory Visit: Payer: Self-pay | Admitting: Obstetrics and Gynecology

## 2013-10-08 VITALS — BP 110/80 | Ht 63.0 in | Wt 209.0 lb

## 2013-10-08 DIAGNOSIS — Z3049 Encounter for surveillance of other contraceptives: Secondary | ICD-10-CM

## 2013-10-08 DIAGNOSIS — Z309 Encounter for contraceptive management, unspecified: Secondary | ICD-10-CM

## 2013-10-08 MED ORDER — MEDROXYPROGESTERONE ACETATE 150 MG/ML IM SUSP
150.0000 mg | Freq: Once | INTRAMUSCULAR | Status: AC
  Administered 2013-10-08: 150 mg via INTRAMUSCULAR

## 2013-10-08 MED ORDER — NORGESTIMATE-ETH ESTRADIOL 0.25-35 MG-MCG PO TABS: 1.0000 | ORAL_TABLET | Freq: Every day | ORAL | Status: DC

## 2013-10-08 NOTE — Progress Notes (Signed)
Pt here for Depo shot. Had a neg quant yesterday. Didn't have sex last night. Willy EddyAshley Travis, LPN spoke with Dr. Emelda FearFerguson who advised it was ok to do shot today without a urine pregnancy test if pt didn't have sex last pm. To return in 12 weeks for next shot. [name]

## 2013-10-09 NOTE — Telephone Encounter (Signed)
Patient had depo Provera given on 10/08/13

## 2013-10-21 ENCOUNTER — Encounter: Payer: 59 | Admitting: Obstetrics and Gynecology

## 2013-10-21 ENCOUNTER — Other Ambulatory Visit: Payer: 59

## 2013-11-03 ENCOUNTER — Telehealth: Payer: Self-pay | Admitting: Obstetrics and Gynecology

## 2013-11-03 ENCOUNTER — Other Ambulatory Visit: Payer: Self-pay | Admitting: *Deleted

## 2013-11-03 NOTE — Telephone Encounter (Signed)
I spoke with Dr. Emelda Fear about pt's issue. He advised that he would refill the metrogel but that if the bleeding continued or got worse the pt would need to make an appointment.   The pt was advised of this and the pt was given an appointment as well. She stated that if she started using the metrogel and things got better that she would cancel the appointment.  Pt switched up front to make her appointment.

## 2013-11-03 NOTE — Telephone Encounter (Signed)
PT states that she was using the metrogel every 3-4 days. The pt states that she hasn't used the metrogel in almost 2 weeks and this morning and the time before that she has had bleeding after sex, not as bad as before. PT states that when she stopped using the metrogel is when the bleeding came back. Pt states that it feels like there is a sore or cut in there and it's not getting any better. Pt denies any major pain during intercourse.

## 2013-11-04 MED ORDER — METRONIDAZOLE 0.75 % VA GEL
1.0000 | Freq: Every day | VAGINAL | Status: DC
Start: ? — End: 2014-03-05

## 2013-11-18 ENCOUNTER — Ambulatory Visit: Payer: 59 | Admitting: Obstetrics and Gynecology

## 2013-11-30 ENCOUNTER — Telehealth: Payer: Self-pay | Admitting: Family Medicine

## 2013-11-30 NOTE — Telephone Encounter (Signed)
[facility] - [place], [place] - [address] is requesting re-fill on phentermine 37.5 MG capsule

## 2013-12-02 MED ORDER — PHENTERMINE HCL 37.5 MG PO CAPS: 37.5000 mg | ORAL_CAPSULE | ORAL | Status: DC

## 2013-12-02 NOTE — Telephone Encounter (Signed)
Call in #[phone] with 5 rf 

## 2013-12-02 NOTE — Telephone Encounter (Signed)
I called in script 

## 2013-12-20 ENCOUNTER — Telehealth: Payer: Self-pay | Admitting: Adult Health

## 2013-12-20 NOTE — Telephone Encounter (Signed)
Spoke with pt. Pt thinks she has a yeast infection. I advised she would need to be seen. Pt voiced understanding. Call transferred to front desk for appt. [name]

## 2013-12-21 ENCOUNTER — Ambulatory Visit: Payer: 59 | Admitting: Adult Health

## 2013-12-24 ENCOUNTER — Ambulatory Visit: Payer: 59

## 2013-12-30 ENCOUNTER — Ambulatory Visit: Payer: 59

## 2013-12-31 ENCOUNTER — Ambulatory Visit: Payer: 59

## 2014-01-03 ENCOUNTER — Telehealth: Payer: Self-pay | Admitting: Adult Health

## 2014-01-03 NOTE — Telephone Encounter (Signed)
Faxed refill for Depo Provera to [facility] for a 90 day supply.

## 2014-01-06 ENCOUNTER — Ambulatory Visit: Payer: 59

## 2014-01-07 ENCOUNTER — Encounter: Payer: Self-pay | Admitting: Adult Health

## 2014-01-07 ENCOUNTER — Ambulatory Visit (INDEPENDENT_AMBULATORY_CARE_PROVIDER_SITE_OTHER): Payer: 59 | Admitting: Adult Health

## 2014-01-07 ENCOUNTER — Ambulatory Visit: Payer: 59

## 2014-01-07 DIAGNOSIS — Z3049 Encounter for surveillance of other contraceptives: Secondary | ICD-10-CM

## 2014-01-07 DIAGNOSIS — Z3202 Encounter for pregnancy test, result negative: Secondary | ICD-10-CM

## 2014-01-07 DIAGNOSIS — Z3042 Encounter for surveillance of injectable contraceptive: Secondary | ICD-10-CM

## 2014-01-07 LAB — POCT URINE PREGNANCY: PREG TEST UR: NEGATIVE

## 2014-01-07 MED ORDER — MEDROXYPROGESTERONE ACETATE 150 MG/ML IM SUSP
150.0000 mg | Freq: Once | INTRAMUSCULAR | Status: AC
Start: 2014-01-07 — End: 2014-01-07
  Administered 2014-01-07: 150 mg via INTRAMUSCULAR

## 2014-01-17 ENCOUNTER — Other Ambulatory Visit: Payer: 59

## 2014-01-17 ENCOUNTER — Ambulatory Visit: Payer: 59

## 2014-03-05 ENCOUNTER — Emergency Department (HOSPITAL_COMMUNITY)
Admission: EM | Admit: 2014-03-05 | Discharge: 2014-03-05 | Disposition: A | Discharge status: Home / Self Care | Payer: 59 | Attending: Emergency Medicine | Admitting: Emergency Medicine

## 2014-03-05 ENCOUNTER — Encounter (HOSPITAL_COMMUNITY): Payer: Self-pay | Admitting: Emergency Medicine

## 2014-03-05 ENCOUNTER — Emergency Department (HOSPITAL_COMMUNITY): Payer: 59

## 2014-03-05 DIAGNOSIS — Z8679 Personal history of other diseases of the circulatory system: Secondary | ICD-10-CM | POA: Insufficient documentation

## 2014-03-05 DIAGNOSIS — Z3202 Encounter for pregnancy test, result negative: Secondary | ICD-10-CM | POA: Diagnosis not present

## 2014-03-05 DIAGNOSIS — Z880 Allergy status to penicillin: Secondary | ICD-10-CM | POA: Insufficient documentation

## 2014-03-05 DIAGNOSIS — K8020 Calculus of gallbladder without cholecystitis without obstruction: Secondary | ICD-10-CM | POA: Diagnosis not present

## 2014-03-05 DIAGNOSIS — Z9889 Other specified postprocedural states: Secondary | ICD-10-CM | POA: Diagnosis not present

## 2014-03-05 DIAGNOSIS — G8929 Other chronic pain: Secondary | ICD-10-CM

## 2014-03-05 DIAGNOSIS — R1011 Right upper quadrant pain: Secondary | ICD-10-CM

## 2014-03-05 DIAGNOSIS — R1013 Epigastric pain: Secondary | ICD-10-CM | POA: Diagnosis present

## 2014-03-05 DIAGNOSIS — Z87442 Personal history of urinary calculi: Secondary | ICD-10-CM | POA: Insufficient documentation

## 2014-03-05 DIAGNOSIS — Z87440 Personal history of urinary (tract) infections: Secondary | ICD-10-CM | POA: Insufficient documentation

## 2014-03-05 DIAGNOSIS — Z8659 Personal history of other mental and behavioral disorders: Secondary | ICD-10-CM | POA: Diagnosis not present

## 2014-03-05 DIAGNOSIS — Z8619 Personal history of other infectious and parasitic diseases: Secondary | ICD-10-CM | POA: Diagnosis not present

## 2014-03-05 DIAGNOSIS — Z79899 Other long term (current) drug therapy: Secondary | ICD-10-CM | POA: Diagnosis not present

## 2014-03-05 DIAGNOSIS — Z8709 Personal history of other diseases of the respiratory system: Secondary | ICD-10-CM | POA: Insufficient documentation

## 2014-03-05 DIAGNOSIS — Z8742 Personal history of other diseases of the female genital tract: Secondary | ICD-10-CM | POA: Diagnosis not present

## 2014-03-05 LAB — CBC WITH DIFFERENTIAL/PLATELET
BASOS PCT: 0 % (ref 0–1)
Basophils Absolute: 0 10*3/uL (ref 0.0–0.1)
EOS ABS: 0.1 10*3/uL (ref 0.0–0.7)
EOS PCT: 2 % (ref 0–5)
HEMATOCRIT: 44 % (ref 36.0–46.0)
HEMOGLOBIN: 14.7 g/dL (ref 12.0–15.0)
LYMPHS ABS: 1.3 10*3/uL (ref 0.7–4.0)
Lymphocytes Relative: 30 % (ref 12–46)
MCH: 30.4 pg (ref 26.0–34.0)
MCHC: 33.4 g/dL (ref 30.0–36.0)
MCV: 90.9 fL (ref 78.0–100.0)
MONO ABS: 0.4 10*3/uL (ref 0.1–1.0)
MONOS PCT: 9 % (ref 3–12)
NEUTROS PCT: 59 % (ref 43–77)
Neutro Abs: 2.5 10*3/uL (ref 1.7–7.7)
Platelets: 277 10*3/uL (ref 150–400)
RBC: 4.84 MIL/uL (ref 3.87–5.11)
RDW: 13.1 % (ref 11.5–15.5)
WBC: 4.3 10*3/uL (ref 4.0–10.5)

## 2014-03-05 LAB — COMPREHENSIVE METABOLIC PANEL
ALT: 16 U/L (ref 0–35)
ANION GAP: 13 (ref 5–15)
AST: 11 U/L (ref 0–37)
Albumin: 4.4 g/dL (ref 3.5–5.2)
Alkaline Phosphatase: 60 U/L (ref 39–117)
BUN: 16 mg/dL (ref 6–23)
CO2: 23 mEq/L (ref 19–32)
CREATININE: 0.86 mg/dL (ref 0.50–1.10)
Calcium: 9.1 mg/dL (ref 8.4–10.5)
Chloride: 105 mEq/L (ref 96–112)
GFR calc non Af Amer: >90 mL/min (ref >90)
GLUCOSE: 97 mg/dL (ref 70–99)
POTASSIUM: 4 meq/L (ref 3.7–5.3)
Sodium: 141 mEq/L (ref 137–147)
TOTAL PROTEIN: 7.5 g/dL (ref 6.0–8.3)
Total Bilirubin: 0.5 mg/dL (ref 0.3–1.2)

## 2014-03-05 LAB — URINALYSIS, ROUTINE W REFLEX MICROSCOPIC
Bilirubin Urine: NEGATIVE
Glucose, UA: NEGATIVE mg/dL
Hgb urine dipstick: NEGATIVE
KETONES UR: NEGATIVE mg/dL
Leukocytes, UA: NEGATIVE
NITRITE: POSITIVE — AB
PH: 6 (ref 5.0–8.0)
Protein, ur: NEGATIVE mg/dL
SPECIFIC GRAVITY, URINE: 1.025 (ref 1.005–1.030)
UROBILINOGEN UA: 1 mg/dL (ref 0.0–1.0)

## 2014-03-05 LAB — URINE MICROSCOPIC-ADD ON

## 2014-03-05 LAB — PREGNANCY, URINE: Preg Test, Ur: NEGATIVE

## 2014-03-05 LAB — LIPASE, BLOOD: LIPASE: 40 U/L (ref 11–59)

## 2014-03-05 MED ORDER — HYDROMORPHONE HCL 1 MG/ML IJ SOLN
1.0000 mg | Freq: Once | INTRAMUSCULAR | Status: AC
  Administered 2014-03-05: 1 mg via INTRAVENOUS
  Filled 2014-03-05: qty 1

## 2014-03-05 MED ORDER — DIPHENHYDRAMINE HCL 50 MG/ML IJ SOLN
25.0000 mg | Freq: Once | INTRAMUSCULAR | Status: AC
  Administered 2014-03-05: 25 mg via INTRAVENOUS
  Filled 2014-03-05: qty 1

## 2014-03-05 MED ORDER — ONDANSETRON HCL 4 MG/2ML IJ SOLN
4.0000 mg | Freq: Once | INTRAMUSCULAR | Status: AC
  Administered 2014-03-05: 4 mg via INTRAVENOUS
  Filled 2014-03-05: qty 2

## 2014-03-05 MED ORDER — ONDANSETRON 4 MG PO TBDP: ORAL_TABLET | ORAL | Status: DC

## 2014-03-05 MED ORDER — OXYCODONE-ACETAMINOPHEN 5-325 MG PO TABS: 1.0000 | ORAL_TABLET | Freq: Four times a day (QID) | ORAL | Status: DC | PRN

## 2014-03-05 MED ORDER — SODIUM CHLORIDE 0.9 % IV BOLUS (SEPSIS)
500.0000 mL | Freq: Once | INTRAVENOUS | Status: AC
  Administered 2014-03-05: 500 mL via INTRAVENOUS

## 2014-03-05 NOTE — ED Notes (Signed)
Pain to right anterior abd radiating to right flank x 2 days with nausea.  Reports pain worse with deep breath and lying flat.

## 2014-03-05 NOTE — ED Provider Notes (Signed)
CSN: [id number]     Arrival date & time 03/05/14  0730 History   First MD Initiated Contact with Patient 03/05/14 [phone]     Chief Complaint  Patient presents with  . Flank Pain     (Consider location/radiation/quality/duration/timing/severity/associated sxs/prior Treatment) Patient is a 28 y.o. female presenting with abdominal pain. The history is provided by the patient (pt complains of ruq abd pain).  Abdominal Pain Pain location:  Epigastric Pain quality: aching   Pain radiates to:  Does not radiate Pain severity:  Moderate Onset quality:  Gradual Timing:  Constant Progression:  Worsening Associated symptoms: no chest pain, no cough, no diarrhea, no fatigue and no hematuria     Past Medical History  Diagnosis Date  . Chicken pox   . Migraines   . Allergy   . Hay fever   . UTI (urinary tract infection)   . Kidney stones   . Anxiety   . Irregular bleeding 12/30/2012  . BV (bacterial vaginosis) 12/30/2012  . Postcoital bleeding 05/13/2013    Had US and IUD in place, bleeding stopped after doxycycline but started back, has normal period lite x 2 days but bleeds every time with sex, is bright red and has clots and cramps after sex   . Contraceptive management 07/15/2013   Past Surgical History  Procedure Laterality Date  . Tonsillectomy    . Dilation and curettage of uterus     Family History  Problem Relation Age of Onset  . Arthritis Mother   . Lung cancer Mother   . Hyperlipidemia Mother   . Atrial fibrillation Mother   . Stroke Mother   . Hypertension Mother   . Diabetes Father   . Dementia Father   . Breast cancer Maternal Aunt   . Diabetes Paternal Uncle    History  Substance Use Topics  . Smoking status: Never Smoker   . Smokeless tobacco: Never Used  . Alcohol Use: No   OB History   Grav Para Term Preterm Abortions TAB SAB Ect Mult Living   4 3   1  1   3      Review of Systems  Constitutional: Negative for appetite change and fatigue.  HENT:  Negative for congestion, ear discharge and sinus pressure.   Eyes: Negative for discharge.  Respiratory: Negative for cough.   Cardiovascular: Negative for chest pain.  Gastrointestinal: Positive for abdominal pain. Negative for diarrhea.  Genitourinary: Negative for frequency and hematuria.  Musculoskeletal: Negative for back pain.  Skin: Negative for rash.  Neurological: Negative for seizures and headaches.  Psychiatric/Behavioral: Negative for hallucinations.      Allergies  Bee venom; Codeine; Milk-related compounds; Penicillins; and Percocet  Home Medications   Prior to Admission medications   Medication Sig Start Date End Date Taking? Authorizing Provider  medroxyPROGESTERone (DEPO-PROVERA) 150 MG/ML injection Inject 1 mL (150 mg total) into the muscle every 3 (three) months. 07/15/13   Adline PotterJennifer A Griffin, NP  ondansetron (ZOFRAN ODT) 4 MG disintegrating tablet 4mg  ODT q4 hours prn nausea/vomit 03/05/14   Benny LennertJoseph L Nashayla Telleria, MD  oxyCODONE-acetaminophen (PERCOCET/ROXICET) 5-325 MG per tablet Take 1 tablet by mouth every 6 (six) hours as needed. 03/05/14   Benny LennertJoseph L Jerzi Tigert, MD  phentermine 37.5 MG capsule Take 1 capsule (37.5 mg total) by mouth every morning. 12/02/13   Nelwyn SalisburyStephen A Fry, MD   BP 111/76  Pulse 63  Temp(Src) 98.5 F (36.9 C) (Oral)  Resp 16  Ht 5\' 2"  (1.575 m)  Wt 180  lb (81.647 kg)  BMI 32.91 kg/m2  SpO2 100% Physical Exam  Constitutional: She is oriented to person, place, and time. She appears well-developed.  HENT:  Head: Normocephalic.  Eyes: Conjunctivae and EOM are normal. No scleral icterus.  Neck: Neck supple. No thyromegaly present.  Cardiovascular: Normal rate and regular rhythm.  Exam reveals no gallop and no friction rub.   No murmur heard. Pulmonary/Chest: No stridor. She has no wheezes. She has no rales. She exhibits no tenderness.  Abdominal: She exhibits no distension. There is tenderness. There is no rebound.  Tender right upper abd pain   Musculoskeletal: Normal range of motion. She exhibits no edema.  Lymphadenopathy:    She has no cervical adenopathy.  Neurological: She is oriented to person, place, and time. She exhibits normal muscle tone. Coordination normal.  Skin: No rash noted. No erythema.  Psychiatric: She has a normal mood and affect. Her behavior is normal.    ED Course  Procedures (including critical care time) Labs Review Labs Reviewed  URINALYSIS, ROUTINE W REFLEX MICROSCOPIC - Abnormal; Notable for the following:    Nitrite POSITIVE (*)    All other components within normal limits  URINE MICROSCOPIC-ADD ON - Abnormal; Notable for the following:    Squamous Epithelial / LPF FEW (*)    Bacteria, UA FEW (*)    All other components within normal limits  CBC WITH DIFFERENTIAL  COMPREHENSIVE METABOLIC PANEL  LIPASE, BLOOD  PREGNANCY, URINE    Imaging Review Koreas Abdomen Limited Ruq  03/05/2014   CLINICAL DATA:  Chronic right upper quadrant pain  EXAM: US ABDOMEN LIMITED - RIGHT UPPER QUADRANT  COMPARISON:  July 05, 2005  FINDINGS: Gallbladder:  Within the gallbladder, there are multiple echogenic foci which move and shadow consistent with gallstones. Largest individual gallstone measures 1.4 cm in length. Gallbladder wall thickness is borderline prominent. There is no pericholecystic fluid. There is no sonographic Murphy sign noted.  Common bile duct:  Diameter: 3 mm. There is no intrahepatic or extrahepatic biliary duct dilatation.  Liver:  No focal lesion identified. Within normal limits in parenchymal echogenicity.  IMPRESSION: Cholelithiasis. Gallbladder wall thickness is borderline prominent. No pericholecystic fluid. Early cholecystitis is possible given this appearance. It may be reasonable to consider nuclear medicine hepatobiliary imaging study to assess for cystic duct obstruction in this regard.   Electronically Signed   By: Bretta BangWilliam  Woodruff M.D.   On: 03/05/2014 09:30     EKG  Interpretation None      MDM   Final diagnoses:  Gall stones    Spoke with dr. Lovell SheehanJenkins and he will follow up with pt Tuesday       Benny LennertJoseph L Riyana Biel, MD 03/05/14 1556

## 2014-03-05 NOTE — ED Notes (Signed)
Pt reporting facial itching after 2nd dose of dilaudid, MD notified, orders recieved

## 2014-03-05 NOTE — Discharge Instructions (Signed)
Follow up with dr. Franky MachoMark jenkins Tuesday at 930am.   Call him sooner if problems.  Eat no fatty or greasy foods

## 2014-03-08 ENCOUNTER — Encounter (HOSPITAL_COMMUNITY): Payer: Self-pay

## 2014-03-08 ENCOUNTER — Encounter (HOSPITAL_COMMUNITY): Payer: Self-pay | Admitting: Pharmacy Technician

## 2014-03-08 ENCOUNTER — Encounter (HOSPITAL_COMMUNITY)
Admission: RE | Admit: 2014-03-08 | Discharge: 2014-03-08 | Disposition: A | Discharge status: Home / Self Care | Payer: 59 | Source: Ambulatory Visit | Attending: General Surgery | Admitting: General Surgery

## 2014-03-08 HISTORY — DX: Personal history of urinary calculi: Z87.442

## 2014-03-08 HISTORY — DX: Other specified postprocedural states: Z98.890

## 2014-03-08 HISTORY — DX: Other specified postprocedural states: R11.2

## 2014-03-08 NOTE — Pre-Procedure Instructions (Signed)
phine interview done.

## 2014-03-09 ENCOUNTER — Encounter (HOSPITAL_COMMUNITY): Payer: Self-pay | Admitting: *Deleted

## 2014-03-09 ENCOUNTER — Ambulatory Visit (HOSPITAL_COMMUNITY): Payer: 59 | Admitting: Anesthesiology

## 2014-03-09 ENCOUNTER — Encounter (HOSPITAL_COMMUNITY): Payer: 59 | Admitting: Anesthesiology

## 2014-03-09 ENCOUNTER — Encounter (HOSPITAL_COMMUNITY)
Admission: RE | Disposition: A | Discharge status: Home / Self Care | Payer: Self-pay | Source: Ambulatory Visit | Attending: General Surgery

## 2014-03-09 ENCOUNTER — Ambulatory Visit (HOSPITAL_COMMUNITY)
Admission: RE | Admit: 2014-03-09 | Discharge: 2014-03-09 | Disposition: A | Discharge status: Home / Self Care | Payer: 59 | Source: Ambulatory Visit | Attending: General Surgery | Admitting: General Surgery

## 2014-03-09 DIAGNOSIS — F419 Anxiety disorder, unspecified: Secondary | ICD-10-CM | POA: Insufficient documentation

## 2014-03-09 DIAGNOSIS — K8010 Calculus of gallbladder with chronic cholecystitis without obstruction: Secondary | ICD-10-CM | POA: Insufficient documentation

## 2014-03-09 HISTORY — PX: CHOLECYSTECTOMY: SHX55

## 2014-03-09 SURGERY — LAPAROSCOPIC CHOLECYSTECTOMY
Anesthesia: General | Site: Abdomen

## 2014-03-09 MED ORDER — FENTANYL CITRATE 0.05 MG/ML IJ SOLN
INTRAMUSCULAR | Status: AC
  Filled 2014-03-09: qty 5

## 2014-03-09 MED ORDER — CIPROFLOXACIN IN D5W 400 MG/200ML IV SOLN
INTRAVENOUS | Status: AC
  Filled 2014-03-09: qty 200

## 2014-03-09 MED ORDER — CIPROFLOXACIN IN D5W 400 MG/200ML IV SOLN
400.0000 mg | INTRAVENOUS | Status: AC
  Administered 2014-03-09: 400 mg via INTRAVENOUS

## 2014-03-09 MED ORDER — KETOROLAC TROMETHAMINE 30 MG/ML IJ SOLN
30.0000 mg | Freq: Once | INTRAMUSCULAR | Status: AC
  Administered 2014-03-09: 30 mg via INTRAVENOUS

## 2014-03-09 MED ORDER — SODIUM CHLORIDE 0.9 % IJ SOLN
INTRAMUSCULAR | Status: AC
  Filled 2014-03-09: qty 10

## 2014-03-09 MED ORDER — PROMETHAZINE HCL 50 MG PO TABS: 50.0000 mg | ORAL_TABLET | Freq: Four times a day (QID) | ORAL | Status: DC | PRN

## 2014-03-09 MED ORDER — DEXAMETHASONE SODIUM PHOSPHATE 4 MG/ML IJ SOLN
INTRAMUSCULAR | Status: AC
  Filled 2014-03-09: qty 1

## 2014-03-09 MED ORDER — BUPIVACAINE HCL (PF) 0.5 % IJ SOLN
INTRAMUSCULAR | Status: DC | PRN
  Administered 2014-03-09: 10 mL

## 2014-03-09 MED ORDER — POVIDONE-IODINE 10 % EX OINT
TOPICAL_OINTMENT | CUTANEOUS | Status: AC
Start: 2014-03-09 — End: 2014-03-09
  Filled 2014-03-09: qty 1

## 2014-03-09 MED ORDER — MIDAZOLAM HCL 2 MG/2ML IJ SOLN
INTRAMUSCULAR | Status: AC
Start: 2014-03-09 — End: 2014-03-09
  Filled 2014-03-09: qty 2

## 2014-03-09 MED ORDER — GLYCOPYRROLATE 0.2 MG/ML IJ SOLN
INTRAMUSCULAR | Status: DC | PRN
  Administered 2014-03-09: 0.4 mg via INTRAVENOUS

## 2014-03-09 MED ORDER — PROMETHAZINE HCL 25 MG/ML IJ SOLN
INTRAMUSCULAR | Status: AC
  Filled 2014-03-09: qty 1

## 2014-03-09 MED ORDER — FENTANYL CITRATE 0.05 MG/ML IJ SOLN
INTRAMUSCULAR | Status: AC
  Filled 2014-03-09: qty 2

## 2014-03-09 MED ORDER — GLYCOPYRROLATE 0.2 MG/ML IJ SOLN
INTRAMUSCULAR | Status: AC
  Filled 2014-03-09: qty 2

## 2014-03-09 MED ORDER — ROCURONIUM BROMIDE 100 MG/10ML IV SOLN
INTRAVENOUS | Status: DC | PRN
Start: 2014-03-09 — End: 2014-03-09
  Administered 2014-03-09: 35 mg via INTRAVENOUS

## 2014-03-09 MED ORDER — ONDANSETRON HCL 4 MG/2ML IJ SOLN
4.0000 mg | Freq: Once | INTRAMUSCULAR | Status: AC
  Administered 2014-03-09: 4 mg via INTRAVENOUS

## 2014-03-09 MED ORDER — HYDROMORPHONE HCL 1 MG/ML IJ SOLN
0.2500 mg | INTRAMUSCULAR | Status: DC | PRN
  Administered 2014-03-09 (×4): 0.5 mg via INTRAVENOUS

## 2014-03-09 MED ORDER — HEMOSTATIC AGENTS (NO CHARGE) OPTIME
TOPICAL | Status: DC | PRN
  Administered 2014-03-09: 1 via TOPICAL

## 2014-03-09 MED ORDER — SODIUM CHLORIDE 0.9 % IR SOLN
Status: DC | PRN
  Administered 2014-03-09: 500 mL

## 2014-03-09 MED ORDER — LIDOCAINE HCL (PF) 1 % IJ SOLN
INTRAMUSCULAR | Status: AC
  Filled 2014-03-09: qty 5

## 2014-03-09 MED ORDER — PROPOFOL 10 MG/ML IV BOLUS
INTRAVENOUS | Status: DC | PRN
  Administered 2014-03-09: 160 mg via INTRAVENOUS

## 2014-03-09 MED ORDER — GLYCOPYRROLATE 0.2 MG/ML IJ SOLN
0.2000 mg | Freq: Once | INTRAMUSCULAR | Status: AC
  Administered 2014-03-09: 0.2 mg via INTRAVENOUS

## 2014-03-09 MED ORDER — LIDOCAINE HCL 1 % IJ SOLN
INTRAMUSCULAR | Status: DC | PRN
  Administered 2014-03-09: 40 mg via INTRADERMAL

## 2014-03-09 MED ORDER — NEOSTIGMINE METHYLSULFATE 10 MG/10ML IV SOLN
INTRAVENOUS | Status: AC
  Filled 2014-03-09: qty 1

## 2014-03-09 MED ORDER — MIDAZOLAM HCL 2 MG/2ML IJ SOLN
INTRAMUSCULAR | Status: AC
  Filled 2014-03-09: qty 2

## 2014-03-09 MED ORDER — LACTATED RINGERS IV SOLN
INTRAVENOUS | Status: DC
  Administered 2014-03-09: 1000 mL via INTRAVENOUS

## 2014-03-09 MED ORDER — DEXAMETHASONE SODIUM PHOSPHATE 4 MG/ML IJ SOLN
4.0000 mg | Freq: Once | INTRAMUSCULAR | Status: AC
  Administered 2014-03-09: 4 mg via INTRAVENOUS

## 2014-03-09 MED ORDER — ONDANSETRON HCL 4 MG/2ML IJ SOLN
INTRAMUSCULAR | Status: AC
  Filled 2014-03-09: qty 2

## 2014-03-09 MED ORDER — SCOPOLAMINE 1 MG/3DAYS TD PT72
1.0000 | MEDICATED_PATCH | Freq: Once | TRANSDERMAL | Status: DC
  Administered 2014-03-09: 1.5 mg via TRANSDERMAL

## 2014-03-09 MED ORDER — NEOSTIGMINE METHYLSULFATE 10 MG/10ML IV SOLN
INTRAVENOUS | Status: DC | PRN
  Administered 2014-03-09 (×2): 2 mg via INTRAVENOUS

## 2014-03-09 MED ORDER — GLYCOPYRROLATE 0.2 MG/ML IJ SOLN
INTRAMUSCULAR | Status: AC
  Filled 2014-03-09: qty 1

## 2014-03-09 MED ORDER — PROMETHAZINE HCL 25 MG/ML IJ SOLN
6.2500 mg | INTRAMUSCULAR | Status: DC | PRN
  Administered 2014-03-09: 6.25 mg via INTRAVENOUS

## 2014-03-09 MED ORDER — ROCURONIUM BROMIDE 50 MG/5ML IV SOLN
INTRAVENOUS | Status: AC
  Filled 2014-03-09: qty 1

## 2014-03-09 MED ORDER — KETOROLAC TROMETHAMINE 30 MG/ML IJ SOLN
INTRAMUSCULAR | Status: AC
  Filled 2014-03-09: qty 1

## 2014-03-09 MED ORDER — ONDANSETRON HCL 4 MG/2ML IJ SOLN
4.0000 mg | Freq: Once | INTRAMUSCULAR | Status: AC | PRN
  Administered 2014-03-09: 4 mg via INTRAVENOUS

## 2014-03-09 MED ORDER — DEXTROSE 5 % IV SOLN
INTRAVENOUS | Status: DC | PRN
  Administered 2014-03-09: 13:00:00 via INTRAVENOUS

## 2014-03-09 MED ORDER — HYDROMORPHONE HCL 1 MG/ML IJ SOLN
INTRAMUSCULAR | Status: AC
  Filled 2014-03-09: qty 1

## 2014-03-09 MED ORDER — MIDAZOLAM HCL 5 MG/5ML IJ SOLN
INTRAMUSCULAR | Status: DC | PRN
  Administered 2014-03-09: 2 mg via INTRAVENOUS

## 2014-03-09 MED ORDER — POVIDONE-IODINE 10 % OINT PACKET
TOPICAL_OINTMENT | CUTANEOUS | Status: DC | PRN
  Administered 2014-03-09: 1 via TOPICAL

## 2014-03-09 MED ORDER — CHLORHEXIDINE GLUCONATE 4 % EX LIQD: 1.0000 "application " | Freq: Once | CUTANEOUS | Status: DC

## 2014-03-09 MED ORDER — HYDROCODONE-ACETAMINOPHEN 5-325 MG PO TABS: 1.0000 | ORAL_TABLET | ORAL | Status: DC | PRN

## 2014-03-09 MED ORDER — FENTANYL CITRATE 0.05 MG/ML IJ SOLN
INTRAMUSCULAR | Status: DC | PRN
  Administered 2014-03-09: 150 ug via INTRAVENOUS
  Administered 2014-03-09: 50 ug via INTRAVENOUS
  Administered 2014-03-09 (×2): 100 ug via INTRAVENOUS
  Administered 2014-03-09: 50 ug via INTRAVENOUS

## 2014-03-09 MED ORDER — BUPIVACAINE HCL (PF) 0.5 % IJ SOLN
INTRAMUSCULAR | Status: AC
  Filled 2014-03-09: qty 30

## 2014-03-09 MED ORDER — MIDAZOLAM HCL 2 MG/2ML IJ SOLN
1.0000 mg | INTRAMUSCULAR | Status: AC | PRN
  Administered 2014-03-09 (×3): 2 mg via INTRAVENOUS

## 2014-03-09 MED ORDER — PROPOFOL 10 MG/ML IV BOLUS
INTRAVENOUS | Status: AC
  Filled 2014-03-09: qty 20

## 2014-03-09 MED ORDER — SCOPOLAMINE 1 MG/3DAYS TD PT72
MEDICATED_PATCH | TRANSDERMAL | Status: AC
  Filled 2014-03-09: qty 1

## 2014-03-09 MED ORDER — FENTANYL CITRATE 0.05 MG/ML IJ SOLN
25.0000 ug | INTRAMUSCULAR | Status: DC | PRN
  Administered 2014-03-09 (×4): 50 ug via INTRAVENOUS

## 2014-03-09 SURGICAL SUPPLY — 43 items
APPLIER CLIP LAPSCP 10X32 DD (CLIP) ×2 IMPLANT
BAG HAMPER (MISCELLANEOUS) ×2 IMPLANT
BAG SPEC RTRVL LRG 6X4 10 (ENDOMECHANICALS) ×2
BLADE SURG SZ11 CARB STEEL (BLADE) ×1 IMPLANT
CLOTH BEACON ORANGE TIMEOUT ST (SAFETY) ×2 IMPLANT
COVER LIGHT HANDLE STERIS (MISCELLANEOUS) ×4 IMPLANT
DECANTER SPIKE VIAL GLASS SM (MISCELLANEOUS) ×2 IMPLANT
DURAPREP 26ML APPLICATOR (WOUND CARE) ×2 IMPLANT
ELECT REM PT RETURN 9FT ADLT (ELECTROSURGICAL) ×2
ELECTRODE REM PT RTRN 9FT ADLT (ELECTROSURGICAL) ×1 IMPLANT
FILTER SMOKE EVAC LAPAROSHD (FILTER) ×2 IMPLANT
FORMALIN 10 PREFIL 120ML (MISCELLANEOUS) ×2 IMPLANT
GLOVE ECLIPSE 6.5 STRL STRAW (GLOVE) ×1 IMPLANT
GLOVE EXAM NITRILE PF MED BLUE (GLOVE) ×1 IMPLANT
GLOVE INDICATOR 7.0 STRL GRN (GLOVE) ×2 IMPLANT
GLOVE SS BIOGEL STRL SZ 6.5 (GLOVE) IMPLANT
GLOVE SUPERSENSE BIOGEL SZ 6.5 (GLOVE) ×1
GLOVE SURG SS PI 7.5 STRL IVOR (GLOVE) ×2 IMPLANT
GOWN STRL REUS W/ TWL XL LVL3 (GOWN DISPOSABLE) ×1 IMPLANT
GOWN STRL REUS W/TWL LRG LVL3 (GOWN DISPOSABLE) ×4 IMPLANT
GOWN STRL REUS W/TWL XL LVL3 (GOWN DISPOSABLE) ×2
HEMOSTAT SNOW SURGICEL 2X4 (HEMOSTASIS) ×2 IMPLANT
INST SET LAPROSCOPIC AP (KITS) ×2 IMPLANT
KIT ROOM TURNOVER APOR (KITS) ×2 IMPLANT
MANIFOLD NEPTUNE II (INSTRUMENTS) ×2 IMPLANT
NDL INSUFFLATION 14GA 120MM (NEEDLE) ×1 IMPLANT
NEEDLE INSUFFLATION 14GA 120MM (NEEDLE) ×2 IMPLANT
NS IRRIG 1000ML POUR BTL (IV SOLUTION) ×2 IMPLANT
PACK LAP CHOLE LZT030E (CUSTOM PROCEDURE TRAY) ×2 IMPLANT
PAD ARMBOARD 7.5X6 YLW CONV (MISCELLANEOUS) ×2 IMPLANT
POUCH SPECIMEN RETRIEVAL 10MM (ENDOMECHANICALS) ×3 IMPLANT
SET BASIN LINEN APH (SET/KITS/TRAYS/PACK) ×2 IMPLANT
SLEEVE ENDOPATH XCEL 5M (ENDOMECHANICALS) ×2 IMPLANT
SPONGE GAUZE 2X2 8PLY STRL LF (GAUZE/BANDAGES/DRESSINGS) ×8 IMPLANT
STAPLER VISISTAT (STAPLE) ×2 IMPLANT
SUT VICRYL 0 UR6 27IN ABS (SUTURE) ×2 IMPLANT
TAPE CLOTH SURG 4X10 WHT LF (GAUZE/BANDAGES/DRESSINGS) ×1 IMPLANT
TROCAR ENDO BLADELESS 11MM (ENDOMECHANICALS) ×2 IMPLANT
TROCAR XCEL NON-BLD 5MMX100MML (ENDOMECHANICALS) ×2 IMPLANT
TROCAR XCEL UNIV SLVE 11M 100M (ENDOMECHANICALS) ×2 IMPLANT
TUBING INSUFFLATION (TUBING) ×2 IMPLANT
WARMER LAPAROSCOPE (MISCELLANEOUS) ×2 IMPLANT
YANKAUER SUCT 12FT TUBE ARGYLE (SUCTIONS) ×2 IMPLANT

## 2014-03-09 NOTE — Op Note (Signed)
Patient:  Audrey [name]  DOB:  [birth year]  MRN:  [id number]   Preop Diagnosis:  Cholecystitis, cholelithiasis  Postop Diagnosis:  Same  Procedure:  Laparoscopic cholecystectomy  Surgeon:  Franky MachoMark Garner Dullea, M.D.  Anes:  General endotracheal  Indications:  Patient is a 28 year old white female who presents with cholecystitis with cholelithiasis. The risks and benefits of the procedure including bleeding, infection, hepatobiliary injury, and the possibility of an open procedure were fully explained to the patient, who gave informed consent.  Procedure note:  Patient is placed the supine position. After induction of general endotracheal anesthesia, the abdomen was prepped and draped using usual sterile technique with Chloroprep. Surgical site confirmation was performed.  A supraumbilical incision was made down to the fascia. A Veress needle was introduced into the abdominal cavity and confirmation of placement was done using the saline drop test. The abdomen was then insufflated to 16 mm mercury pressure. An 11 mm trocar was introduced into the abdominal cavity under direct visualization without difficulty. The patient was placed in reverse Trendelenburg position and additional 11 mm trocar was placed the epigastric region and 5 mm trochars were placed the right upper quadrant and right flank regions. Liver was inspected and noted to be within normal limits. The gallbladder was retracted in a dynamic fashion in order to expose the triangle of Calot. The cystic duct was first identified. Its junction to the infundibulum was fully identified. Endoclips placed proximally distally on the cystic duct, and the cystic duct was divided. This was likewise done cystic artery. The gallbladder was freed away from the gallbladder fossa using Bovie electrocautery. The gallbladder was delivered through the epigastric trocar site using an Endo Catch bag. The gallbladder fossa was inspected and no abnormal bleeding or  bile leakage was noted. Surgicel is placed the gallbladder fossa. All fluid and air were then evacuated from the abdominal cavity prior to removal of the trochars.  All wounds were irrigated with normal saline. All wounds were injected with 0.5% Sensorcaine. The supraumbilical fascia as well as epigastric fascia were reapproximated using 0 Vicryl interrupted sutures. All skin incisions were closed using staples. Betadine ointment and dry sterile dressings were applied.  All tape and needle counts were correct at the end of the procedure. Patient was extubated in the operating room and transferred to PACU in stable condition.    Complications:  None  EBL:  Minimal  Specimen:  Gallbladder

## 2014-03-09 NOTE — Anesthesia Procedure Notes (Addendum)
Performed by: Despina HiddenIDACAVAGE, Michaiah Holsopple J   Procedure Name: Intubation Date/Time: 03/09/2014 1:45 PM Performed by: Despina HiddenIDACAVAGE, Quanetta Truss J Pre-anesthesia Checklist: Emergency Drugs available, Suction available, Patient being monitored and Patient identified Patient Re-evaluated:Patient Re-evaluated prior to inductionOxygen Delivery Method: Circle system utilized Preoxygenation: Pre-oxygenation with 100% oxygen Intubation Type: IV induction Ventilation: Mask ventilation without difficulty and Oral airway inserted - appropriate to patient size Laryngoscope Size: Mac and 3 Grade View: Grade II Tube type: Oral Number of attempts: 1 Airway Equipment and Method: Stylet Placement Confirmation: ETT inserted through vocal cords under direct vision,  positive ETCO2 and breath sounds checked- equal and bilateral Secured at: 24 cm Tube secured with: Tape Dental Injury: Teeth and Oropharynx as per pre-operative assessment

## 2014-03-09 NOTE — Anesthesia Postprocedure Evaluation (Signed)
  Anesthesia Post-op Note  Patient: Careers adviserAmber N Massey  Procedure(s) Performed: Procedure(s): LAPAROSCOPIC CHOLECYSTECTOMY (N/A)  Patient Location: PACU  Anesthesia Type:General  Level of Consciousness: awake, alert , oriented and patient cooperative  Airway and Oxygen Therapy: Patient Spontanous Breathing  Post-op Pain: 3 /10, mild  Post-op Assessment: Post-op Vital signs reviewed, Patient's Cardiovascular Status Stable, Respiratory Function Stable, Patent Airway, No signs of Nausea or vomiting and Pain level controlled  Post-op Vital Signs: Reviewed and stable  Last Vitals:  Filed Vitals:   03/09/14 1545  BP: 128/86  Pulse:   Temp: 36.7 C  Resp: 19    Complications: No apparent anesthesia complications

## 2014-03-09 NOTE — Interval H&P Note (Signed)
History and Physical Interval Note:  03/09/2014 1:39 PM  Audrey [name]  has presented today for surgery, with the diagnosis of cholelithiasis  The various methods of treatment have been discussed with the patient and family. After consideration of risks, benefits and other options for treatment, the patient has consented to  Procedure(s): LAPAROSCOPIC CHOLECYSTECTOMY (N/A) as a surgical intervention .  The patient's history has been reviewed, patient examined, no change in status, stable for surgery.  I have reviewed the patient's chart and labs.  Questions were answered to the patient's satisfaction.     Franky MachoJENKINS,Nelani Schmelzle A

## 2014-03-09 NOTE — Anesthesia Preprocedure Evaluation (Addendum)
Anesthesia Evaluation  Patient identified by MRN, date of birth, ID band Patient awake    Reviewed: Allergy & Precautions, H&P , NPO status , Patient's Chart, lab work & pertinent test results  History of Anesthesia Complications (+) PONV and history of anesthetic complications  Airway Mallampati: II TM Distance: >3 FB Neck ROM: Full    Dental  (+) Teeth Intact   Pulmonary neg pulmonary ROS,  breath sounds clear to auscultation        Cardiovascular negative cardio ROS  Rhythm:Regular Rate:Normal     Neuro/Psych  Headaches, PSYCHIATRIC DISORDERS Anxiety    GI/Hepatic   Endo/Other    Renal/GU      Musculoskeletal   Abdominal   Peds  Hematology   Anesthesia Other Findings   Reproductive/Obstetrics                           Anesthesia Physical Anesthesia Plan  ASA: II  Anesthesia Plan: General   Post-op Pain Management:    Induction: Intravenous  Airway Management Planned: Oral ETT  Additional Equipment:   Intra-op Plan:   Post-operative Plan: Extubation in OR  Informed Consent: I have reviewed the patients History and Physical, chart, labs and discussed the procedure including the risks, benefits and alternatives for the proposed anesthesia with the patient or authorized representative who has indicated his/her understanding and acceptance.     Plan Discussed with:   Anesthesia Plan Comments:         Anesthesia Quick Evaluation

## 2014-03-09 NOTE — Progress Notes (Signed)
Awake. Continues c/o postop abd pain. Rates pain 8.

## 2014-03-09 NOTE — H&P (Signed)
  NTS SOAP Note  Vital Signs:  Vitals as of: 03/08/2014: Systolic 81: Diastolic 65: Heart Rate 99: Temp 98.66F: Height 525ft 4in: Weight 200Lbs 0 Ounces: Pain Level 5: BMI 34.33  BMI : 34.33 kg/m2  Subjective: This 28 year old female presents for of abdominal pain.  Has been present intermittenlly for many years,  recently has increased in frequency and intensity.  Was seen in ER recently.  Right upper quadrant abdominal pain which radiates to the right flank,  nausea,  and bloating.  No fever,  chills,  jaundice.  Review of Symptoms:  Constitutional:fatigue headache Eyes:blurred vision bilateral sinus problems Cardiovascular:  unremarkable Respiratory:unremarkable Gastrointestinal:    abdominal pain, nausea, heartburn Genitourinary:frequency Musculoskeletal:unremarkable Skin:unremarkable Hematolgic/Lymphatic:unremarkable   Allergic/Immunologic:unremarkable   Past Medical History:  Reviewed  Past Medical History  Surgical History: d and c Medical Problems: none Allergies: pcn,  mllk Medications: phenteramine,  depo shots   Social History:Reviewed  Social History  Preferred Language: English Race:  White Ethnicity: Not Hispanic / Latino Age: [age] year Marital Status:  S Alcohol: no   Smoking Status: Never smoker reviewed on 03/08/2014 Functional Status reviewed on 03/08/2014 ------------------------------------------------ Bathing: Normal Cooking: Normal Dressing: Normal Driving: Normal Eating: Normal Managing Meds: Normal Oral Care: Normal Shopping: Normal Toileting: Normal Transferring: Normal Walking: Normal Cognitive Status reviewed on 03/08/2014 ------------------------------------------------ Attention: Normal Decision Making: Normal Language: Normal Memory: Normal Motor: Normal Perception: Normal Problem Solving: Normal Visual and Spatial: Normal   Family History:Reviewed  Family Health History Mother, Living; Cancer  unspecified;  Father, Living; Diabetes mellitus, unspecified type;     Objective Information: General:Well appearing, well nourished in no distress. no scleral icterus Heart:RRR, no murmur or gallop.  Normal S1, S2.  No S3, S4.  Lungs:  CTA bilaterally, no wheezes, rhonchi, rales.  Breathing unlabored. Abdomen:Soft, slightly tender in right upper quadrant to palpation,  ND, no HSM, no masses. U/S of gallbladder:  cholelithiasis,  normal common bile duct  LFT's reviewed wnl   Assessment:biliary colic,  cholelithiasis   Diagnoses: 574.20  K80.20 Gallstone (Calculus of gallbladder without cholecystitis without obstruction)  Procedures: 1610999203 - OFFICE OUTPATIENT NEW 30 MINUTES    Plan:  Scheduled for laparoscopic cholecystectomy on 03/09/14.   Patient Education:Alternative treatments to surgery were discussed with patient (and family).  Risks and benefits  of procedure including bleeding,  infection,  hepatobiliary injury,  and the possibility of an open procedure were fully explained to the patient (and family) who gave informed consent. Patient/family questions were addressed.

## 2014-03-09 NOTE — Transfer of Care (Signed)
Immediate Anesthesia Transfer of Care Note  Patient: Audrey [name]  Procedure(s) Performed: Procedure(s): LAPAROSCOPIC CHOLECYSTECTOMY (N/A)  Patient Location: PACU  Anesthesia Type:General  Level of Consciousness: awake and patient cooperative  Airway & Oxygen Therapy: Patient Spontanous Breathing and Patient connected to face mask oxygen  Post-op Assessment: Report given to PACU RN, Post -op Vital signs reviewed and stable and Patient moving all extremities  Post vital signs: Reviewed and stable  Complications: No apparent anesthesia complications

## 2014-03-09 NOTE — Progress Notes (Signed)
From OR. Arousing. Yelling. Screaming. Moaning. Groaning. Oriented to place per nurse. Pain med per anesthesia.

## 2014-03-09 NOTE — Discharge Instructions (Signed)

## 2014-03-10 ENCOUNTER — Encounter (HOSPITAL_COMMUNITY): Payer: Self-pay | Admitting: General Surgery

## 2014-03-28 ENCOUNTER — Encounter (HOSPITAL_COMMUNITY): Payer: Self-pay | Admitting: General Surgery

## 2014-03-31 ENCOUNTER — Ambulatory Visit (INDEPENDENT_AMBULATORY_CARE_PROVIDER_SITE_OTHER): Payer: 59 | Admitting: Adult Health

## 2014-03-31 DIAGNOSIS — Z3042 Encounter for surveillance of injectable contraceptive: Secondary | ICD-10-CM

## 2014-03-31 DIAGNOSIS — Z3202 Encounter for pregnancy test, result negative: Secondary | ICD-10-CM

## 2014-03-31 LAB — POCT URINE PREGNANCY: Preg Test, Ur: NEGATIVE

## 2014-03-31 MED ORDER — MEDROXYPROGESTERONE ACETATE 150 MG/ML IM SUSP
150.0000 mg | Freq: Once | INTRAMUSCULAR | Status: AC
  Administered 2014-03-31: 150 mg via INTRAMUSCULAR

## 2014-05-05 ENCOUNTER — Encounter: Payer: Self-pay | Admitting: Obstetrics and Gynecology

## 2014-05-05 ENCOUNTER — Ambulatory Visit (INDEPENDENT_AMBULATORY_CARE_PROVIDER_SITE_OTHER): Payer: Self-pay | Admitting: Obstetrics and Gynecology

## 2014-05-05 VITALS — BP 112/60 | Ht 65.0 in | Wt 201.0 lb

## 2014-05-05 DIAGNOSIS — N930 Postcoital and contact bleeding: Secondary | ICD-10-CM

## 2014-05-05 MED ORDER — METRONIDAZOLE 500 MG PO TABS: 500.0000 mg | ORAL_TABLET | Freq: Two times a day (BID) | ORAL | Status: DC

## 2014-05-05 NOTE — Progress Notes (Signed)
Patient ID: Audrey [name], female   DOB: 09/03/1985, 28 y.o.   MRN: [id number] Pt here today for bleeding. Pt states that she started a period last week and has had light bleeding and cramping with lower back pain. Pt states that she hurts worse on her right side. Pt states that this started after intercourse.

## 2014-05-05 NOTE — Progress Notes (Signed)
Patient ID: Audrey [name], female   DOB: Jul 26, 1985, 28 y.o.   MRN: [id number]   [facility]  Patient name: Audrey [name] MRN [id number]  Date of birth: Jul 26, 1985  CC & HPI:  Audrey [name] is a 28 y.o. female s/p cryotherapy for cervicitis presenting today for abnormal vaginal bleeding with intercourse and constant back pain.  She states that she is not having any rough intercourse but the bleeding will persist for days at a time.  She is not actively bleeding now.  She is using Depo and cannot remember when her LNMP was.    ROS:  All systems have been reviewed and are negative unless otherwise indicated in the HPI.   Pertinent History Reviewed:   Reviewed: Significant for  Medical         Past Medical History  Diagnosis Date  . Chicken pox   . Migraines   . Allergy   . Hay fever   . UTI (urinary tract infection)   . Anxiety   . Irregular bleeding 12/30/2012  . BV (bacterial vaginosis) 12/30/2012  . Postcoital bleeding 05/13/2013    Had US and IUD in place, bleeding stopped after doxycycline but started back, has normal period lite x 2 days but bleeds every time with sex, is bright red and has clots and cramps after sex   . Contraceptive management 07/15/2013  . PONV (postoperative nausea and vomiting)   . History of kidney stones                               Surgical Hx:    Past Surgical History  Procedure Laterality Date  . Tonsillectomy    . Dilation and curettage of uterus    . Cholecystectomy N/A 03/09/2014    Procedure: LAPAROSCOPIC CHOLECYSTECTOMY;  Surgeon: Dalia HeadingMark A Jenkins, MD;  Location: [facility];  Service: General;  Laterality: N/A;   Medications: Reviewed & Updated - see associated section                      Current outpatient prescriptions: medroxyPROGESTERone (DEPO-PROVERA) 150 MG/ML injection, Inject 1 mL (150 mg total) into the muscle every 3 (three) months., Disp: 1 mL, Rfl: 4   Social History: Reviewed -  reports that she has never smoked. She  has never used smokeless tobacco.  Objective Findings:  Vitals: Blood pressure 112/60, height 5\' 5"  (1.651 m), weight 201 lb (91.173 kg), last menstrual period 04/28/2014.  Physical Examination: General appearance - alert, well appearing, and in no distress, oriented to person, place, and time and overweight Pelvic - normal external genitalia, vulva, vagina, cervix, uterus and adnexa,  VULVA: normal appearing vulva with no masses, tenderness or lesions,  VAGINA: normal appearing vagina with normal color and generous, white, nonpurulent secretions, no lesions,  CERVIX: normal appearing cervix without discharge or lesions, nontender UTERUS: uterus is normal size, shape, consistency and nontender, tiny ADNEXA: normal adnexa in size, nontender and no masses   Assessment & Plan:   A: postocoital bleeding, with  1. Normal cervical exam.    P:  1. Empiric treatment with Metronidazole  Reexamine prn recurrence.  This chart was scribed for Tilda BurrowJohn Cheyrl Buley V, MD by Carl Bestelina Holson, ED Scribe. This patient was seen in Room 1 and the patient's care was started at 3:26 PM.

## 2014-06-16 ENCOUNTER — Other Ambulatory Visit: Payer: Self-pay | Admitting: Obstetrics and Gynecology

## 2014-06-23 ENCOUNTER — Ambulatory Visit: Payer: 59

## 2014-06-29 ENCOUNTER — Ambulatory Visit: Payer: Self-pay

## 2014-06-29 ENCOUNTER — Other Ambulatory Visit: Payer: Self-pay | Admitting: *Deleted

## 2014-06-30 ENCOUNTER — Ambulatory Visit: Payer: Self-pay

## 2014-06-30 ENCOUNTER — Encounter: Payer: Self-pay | Admitting: *Deleted

## 2014-06-30 ENCOUNTER — Ambulatory Visit (INDEPENDENT_AMBULATORY_CARE_PROVIDER_SITE_OTHER): Payer: 59 | Admitting: *Deleted

## 2014-06-30 DIAGNOSIS — Z3042 Encounter for surveillance of injectable contraceptive: Secondary | ICD-10-CM

## 2014-06-30 DIAGNOSIS — Z3202 Encounter for pregnancy test, result negative: Secondary | ICD-10-CM

## 2014-06-30 LAB — POCT URINE PREGNANCY: PREG TEST UR: NEGATIVE

## 2014-06-30 MED ORDER — MEDROXYPROGESTERONE ACETATE 150 MG/ML IM SUSP
150.0000 mg | Freq: Once | INTRAMUSCULAR | Status: AC
  Administered 2014-06-30: 150 mg via INTRAMUSCULAR

## 2014-06-30 NOTE — Progress Notes (Signed)
Pt here for Depo shot. Reports no problems at this time. To return in 12 weeks for next shot. [name] 

## 2014-07-26 ENCOUNTER — Telehealth: Payer: Self-pay | Admitting: Family Medicine

## 2014-07-26 NOTE — Telephone Encounter (Signed)
Pt would like dr fry to accept her boyfriend as new pt. Can I sch?

## 2014-07-26 NOTE — Telephone Encounter (Signed)
Sorry but I am too full  

## 2014-07-26 NOTE — Telephone Encounter (Signed)
Please advise 

## 2014-07-28 NOTE — Telephone Encounter (Signed)
Pt girlfriend is aware

## 2014-07-28 NOTE — Telephone Encounter (Signed)
lmom for pt to call back

## 2014-07-29 ENCOUNTER — Ambulatory Visit: Payer: Self-pay | Admitting: Family Medicine

## 2014-07-29 ENCOUNTER — Encounter: Payer: Self-pay | Admitting: Family Medicine

## 2014-07-29 ENCOUNTER — Ambulatory Visit (INDEPENDENT_AMBULATORY_CARE_PROVIDER_SITE_OTHER): Payer: 59 | Admitting: Family Medicine

## 2014-07-29 VITALS — BP 117/81 | HR 77 | Temp 98.6°F | Ht 65.0 in | Wt 210.0 lb

## 2014-07-29 DIAGNOSIS — F419 Anxiety disorder, unspecified: Secondary | ICD-10-CM

## 2014-07-29 DIAGNOSIS — E669 Obesity, unspecified: Secondary | ICD-10-CM

## 2014-07-29 MED ORDER — LORAZEPAM 0.5 MG PO TABS: 0.5000 mg | ORAL_TABLET | Freq: Three times a day (TID) | ORAL | Status: DC | PRN

## 2014-07-29 MED ORDER — PHENTERMINE HCL 30 MG PO CAPS: 30.0000 mg | ORAL_CAPSULE | ORAL | Status: DC

## 2014-07-29 NOTE — Progress Notes (Signed)
Pre visit review using our clinic review tool, if applicable. No additional management support is needed unless otherwise documented below in the visit note. 

## 2014-07-29 NOTE — Progress Notes (Signed)
   Subjective:    Patient ID: Cristal GenerousAmber N Massey, female    DOB: Oct 24, 1985, 29 y.o.   MRN: [id number]  HPI Here for 2 issues. First she would like to try Phentermine again to help lose weight. She is exercising but finds it difficult to control her appetite. She used this 2 years ago and was able to lose 40 lbs, but in the past 6 months has out 15 of these back on. Also she asks to try something for situational anxiety. She gets very anxious, shaky, and sweaty in large crowds, such as in shopping malls or at church. She wants to try something to use as needed. He tried Celexa and Lexapro a few years ago but they made her drowsy. She denies any depression sx and she sleeps well.    Review of Systems  Constitutional: Negative.   Respiratory: Negative.   Cardiovascular: Negative.   Endocrine: Negative.   Neurological: Negative.   Psychiatric/Behavioral: Negative for hallucinations, behavioral problems, confusion, dysphoric mood, decreased concentration and agitation. The patient is nervous/anxious. The patient is not hyperactive.        Objective:   Physical Exam  Constitutional: She appears well-developed and well-nourished.  Neck: No thyromegaly present.  Cardiovascular: Normal rate, regular rhythm, normal heart sounds and intact distal pulses.   Pulmonary/Chest: Effort normal and breath sounds normal.  Lymphadenopathy:    She has no cervical adenopathy.  Psychiatric: She has a normal mood and affect. Her behavior is normal. Thought content normal.          Assessment & Plan:  Try Phentermine 30 mg daily. Try Lorazepam 0.5 mg prn.

## 2014-08-23 ENCOUNTER — Telehealth: Payer: Self-pay | Admitting: Obstetrics and Gynecology

## 2014-08-24 ENCOUNTER — Other Ambulatory Visit: Payer: Self-pay | Admitting: Obstetrics and Gynecology

## 2014-08-24 DIAGNOSIS — N390 Urinary tract infection, site not specified: Secondary | ICD-10-CM

## 2014-08-24 HISTORY — DX: Urinary tract infection, site not specified: N39.0

## 2014-08-24 MED ORDER — SULFAMETHOXAZOLE-TRIMETHOPRIM 800-160 MG PO TABS: 1.0000 | ORAL_TABLET | Freq: Two times a day (BID) | ORAL | Status: DC

## 2014-08-24 NOTE — Progress Notes (Signed)
Pt aware that medication was sent to her pharmacy.

## 2014-09-12 ENCOUNTER — Telehealth: Payer: Self-pay | Admitting: Family Medicine

## 2014-09-12 NOTE — Telephone Encounter (Signed)
Pt had her Mom called to say that she would like to go back on the following med phentermine 30 MG capsule . She said the previous med was not working     Field seismologistharmacy  [place]

## 2014-09-12 NOTE — Telephone Encounter (Signed)
I gave her a 3 month supply just 6 weeks ago

## 2014-09-13 NOTE — Telephone Encounter (Signed)
Switch to Phentermine 37.5 mg daily, call in a 6 month supply

## 2014-09-13 NOTE — Telephone Encounter (Signed)
I spoke with pt and she wants to go back on phentermine 37.5 mg capsule. She picked up this new script and doesn't think that it is working.

## 2014-09-14 MED ORDER — PHENTERMINE HCL 37.5 MG PO CAPS: 37.5000 mg | ORAL_CAPSULE | ORAL | Status: DC

## 2014-09-14 NOTE — Addendum Note (Signed)
Addended by: Aniceto BossNIMMONS, SYLVIA A on: 09/14/2014 11:51 AM   Modules accepted: Orders, Medications

## 2014-09-14 NOTE — Telephone Encounter (Signed)
I called in script and left a voice message for pt. 

## 2014-09-21 ENCOUNTER — Other Ambulatory Visit: Payer: Self-pay | Admitting: Adult Health

## 2014-09-22 ENCOUNTER — Encounter: Payer: Self-pay | Admitting: *Deleted

## 2014-09-22 ENCOUNTER — Ambulatory Visit (INDEPENDENT_AMBULATORY_CARE_PROVIDER_SITE_OTHER): Payer: 59 | Admitting: *Deleted

## 2014-09-22 DIAGNOSIS — Z3202 Encounter for pregnancy test, result negative: Secondary | ICD-10-CM | POA: Diagnosis not present

## 2014-09-22 DIAGNOSIS — Z3042 Encounter for surveillance of injectable contraceptive: Secondary | ICD-10-CM

## 2014-09-22 LAB — POCT URINE PREGNANCY: PREG TEST UR: NEGATIVE

## 2014-09-22 MED ORDER — MEDROXYPROGESTERONE ACETATE 150 MG/ML IM SUSP
150.0000 mg | Freq: Once | INTRAMUSCULAR | Status: AC
  Administered 2014-09-22: 150 mg via INTRAMUSCULAR

## 2014-09-22 NOTE — Progress Notes (Signed)
Pt here for Depo. Reports no problems at this time. Return in 12 weeks for next shot. [name] 

## 2014-12-15 ENCOUNTER — Ambulatory Visit (INDEPENDENT_AMBULATORY_CARE_PROVIDER_SITE_OTHER): Payer: 59 | Admitting: *Deleted

## 2014-12-15 DIAGNOSIS — Z3202 Encounter for pregnancy test, result negative: Secondary | ICD-10-CM | POA: Diagnosis not present

## 2014-12-15 DIAGNOSIS — Z3042 Encounter for surveillance of injectable contraceptive: Secondary | ICD-10-CM

## 2014-12-15 LAB — POCT URINE PREGNANCY: PREG TEST UR: NEGATIVE

## 2014-12-15 MED ORDER — MEDROXYPROGESTERONE ACETATE 150 MG/ML IM SUSP
150.0000 mg | Freq: Once | INTRAMUSCULAR | Status: AC
  Administered 2014-12-15: 150 mg via INTRAMUSCULAR

## 2014-12-15 NOTE — Progress Notes (Signed)
Patient ID: Audrey [name], female   DOB: 1986-03-01, 29 y.o.   MRN: [id number] Depo Provera 150 mg IM given in left deltoid with no complications, pregnancy test negative. Pt to return in 11 weeks for physical and discuss birth control options with Cyril Mourning, NP.

## 2015-01-20 ENCOUNTER — Emergency Department (HOSPITAL_COMMUNITY): Payer: 59

## 2015-01-20 ENCOUNTER — Encounter (HOSPITAL_COMMUNITY): Payer: Self-pay | Admitting: *Deleted

## 2015-01-20 ENCOUNTER — Emergency Department (HOSPITAL_COMMUNITY)
Admission: EM | Admit: 2015-01-20 | Discharge: 2015-01-20 | Disposition: A | Discharge status: Home / Self Care | Payer: 59 | Attending: Emergency Medicine | Admitting: Emergency Medicine

## 2015-01-20 DIAGNOSIS — Z880 Allergy status to penicillin: Secondary | ICD-10-CM | POA: Diagnosis not present

## 2015-01-20 DIAGNOSIS — S99912A Unspecified injury of left ankle, initial encounter: Secondary | ICD-10-CM | POA: Diagnosis present

## 2015-01-20 DIAGNOSIS — W108XXA Fall (on) (from) other stairs and steps, initial encounter: Secondary | ICD-10-CM | POA: Insufficient documentation

## 2015-01-20 DIAGNOSIS — Z8619 Personal history of other infectious and parasitic diseases: Secondary | ICD-10-CM | POA: Diagnosis not present

## 2015-01-20 DIAGNOSIS — Y998 Other external cause status: Secondary | ICD-10-CM | POA: Insufficient documentation

## 2015-01-20 DIAGNOSIS — Z8709 Personal history of other diseases of the respiratory system: Secondary | ICD-10-CM | POA: Diagnosis not present

## 2015-01-20 DIAGNOSIS — Y9389 Activity, other specified: Secondary | ICD-10-CM | POA: Insufficient documentation

## 2015-01-20 DIAGNOSIS — F419 Anxiety disorder, unspecified: Secondary | ICD-10-CM | POA: Diagnosis not present

## 2015-01-20 DIAGNOSIS — S90812A Abrasion, left foot, initial encounter: Secondary | ICD-10-CM | POA: Insufficient documentation

## 2015-01-20 DIAGNOSIS — S93402A Sprain of unspecified ligament of left ankle, initial encounter: Secondary | ICD-10-CM | POA: Diagnosis not present

## 2015-01-20 DIAGNOSIS — Z8679 Personal history of other diseases of the circulatory system: Secondary | ICD-10-CM | POA: Diagnosis not present

## 2015-01-20 DIAGNOSIS — Z87442 Personal history of urinary calculi: Secondary | ICD-10-CM | POA: Insufficient documentation

## 2015-01-20 DIAGNOSIS — Z8742 Personal history of other diseases of the female genital tract: Secondary | ICD-10-CM | POA: Insufficient documentation

## 2015-01-20 DIAGNOSIS — Y9289 Other specified places as the place of occurrence of the external cause: Secondary | ICD-10-CM | POA: Diagnosis not present

## 2015-01-20 DIAGNOSIS — M79672 Pain in left foot: Secondary | ICD-10-CM

## 2015-01-20 DIAGNOSIS — Z87440 Personal history of urinary (tract) infections: Secondary | ICD-10-CM | POA: Insufficient documentation

## 2015-01-20 DIAGNOSIS — T148XXA Other injury of unspecified body region, initial encounter: Secondary | ICD-10-CM

## 2015-01-20 NOTE — Discharge Instructions (Signed)
Abrasion An abrasion is a cut or scrape of the skin. Abrasions do not extend through all layers of the skin and most heal within 10 days. It is important to care for your abrasion properly to prevent infection. CAUSES  Most abrasions are caused by falling on, or gliding across, the ground or other surface. When your skin rubs on something, the outer and inner layer of skin rubs off, causing an abrasion. DIAGNOSIS  Your caregiver will be able to diagnose an abrasion during a physical exam.  TREATMENT  Your treatment depends on how large and deep the abrasion is. Generally, your abrasion will be cleaned with water and a mild soap to remove any dirt or debris. An antibiotic ointment may be put over the abrasion to prevent an infection. A bandage (dressing) may be wrapped around the abrasion to keep it from getting dirty.  You may need a tetanus shot if:  You cannot remember when you had your last tetanus shot.  You have never had a tetanus shot.  The injury broke your skin. If you get a tetanus shot, your arm may swell, get red, and feel warm to the touch. This is common and not a problem. If you need a tetanus shot and you choose not to have one, there is a rare chance of getting tetanus. Sickness from tetanus can be serious.  HOME CARE INSTRUCTIONS   If a dressing was applied, change it at least once a day or as directed by your caregiver. If the bandage sticks, soak it off with warm water.   Wash the area with water and a mild soap to remove all the ointment 2 times a day. Rinse off the soap and pat the area dry with a clean towel.   Reapply any ointment as directed by your caregiver. This will help prevent infection and keep the bandage from sticking. Use gauze over the wound and under the dressing to help keep the bandage from sticking.   Change your dressing right away if it becomes wet or dirty.   Only take over-the-counter or prescription medicines for pain, discomfort, or fever as  directed by your caregiver.   Follow up with your caregiver within 24-48 hours for a wound check, or as directed. If you were not given a wound-check appointment, look closely at your abrasion for redness, swelling, or pus. These are signs of infection. SEEK IMMEDIATE MEDICAL CARE IF:   You have increasing pain in the wound.   You have redness, swelling, or tenderness around the wound.   You have pus coming from the wound.   You have a fever or persistent symptoms for more than 2-3 days.  You have a fever and your symptoms suddenly get worse.  You have a bad smell coming from the wound or dressing.  MAKE SURE YOU:   Understand these instructions.  Will watch your condition.  Will get help right away if you are not doing well or get worse. Document Released: 02/20/2005 Document Revised: 04/29/2012 Document Reviewed: 04/16/2011 [facility] Patient Information 2015 [place], [place]. This information is not intended to replace advice given to you by your health care provider. Make sure you discuss any questions you have with your health care provider.  Ankle Sprain An ankle sprain is an injury to the strong, fibrous tissues (ligaments) that hold the bones of your ankle joint together.  CAUSES An ankle sprain is usually caused by a fall or by twisting your ankle. Ankle sprains most commonly occur when  you step on the outer edge of your foot, and your ankle turns inward. People who participate in sports are more prone to these types of injuries.  °SYMPTOMS  °· Pain in your ankle. The pain may be present at rest or only when you are trying to stand or walk. °· Swelling. °· Bruising. Bruising may develop immediately or within 1 to 2 days after your injury. °· Difficulty standing or walking, particularly when turning corners or changing directions. °DIAGNOSIS  °Your caregiver will ask you details about your injury and perform a physical exam of your ankle to determine if you have an ankle  sprain. During the physical exam, your caregiver will press on and apply pressure to specific areas of your foot and ankle. Your caregiver will try to move your ankle in certain ways. An X-ray exam may be done to be sure a bone was not broken or a ligament did not separate from one of the bones in your ankle (avulsion fracture).  °TREATMENT  °Certain types of braces can help stabilize your ankle. Your caregiver can make a recommendation for this. Your caregiver may recommend the use of medicine for pain. If your sprain is severe, your caregiver may refer you to a surgeon who helps to restore function to parts of your skeletal system (orthopedist) or a physical therapist. °HOME CARE INSTRUCTIONS  °· Apply ice to your injury for 1-2 days or as directed by your caregiver. Applying ice helps to reduce inflammation and pain. °¨ Put ice in a plastic bag. °¨ Place a towel between your skin and the bag. °¨ Leave the ice on for 15-20 minutes at a time, every 2 hours while you are awake. °· Only take over-the-counter or prescription medicines for pain, discomfort, or fever as directed by your caregiver. °· Elevate your injured ankle above the level of your heart as much as possible for 2-3 days. °· If your caregiver recommends crutches, use them as instructed. Gradually put weight on the affected ankle. Continue to use crutches or a cane until you can walk without feeling pain in your ankle. °· If you have a plaster splint, wear the splint as directed by your caregiver. Do not rest it on anything harder than a pillow for the first 24 hours. Do not put weight on it. Do not get it wet. You may take it off to take a shower or bath. °· You may have been given an elastic bandage to wear around your ankle to provide support. If the elastic bandage is too tight (you have numbness or tingling in your foot or your foot becomes cold and blue), adjust the bandage to make it comfortable. °· If you have an air splint, you may blow more  air into it or let air out to make it more comfortable. You may take your splint off at night and before taking a shower or bath. Wiggle your toes in the splint several times per day to decrease swelling. °SEEK MEDICAL CARE IF:  °· You have rapidly increasing bruising or swelling. °· Your toes feel extremely cold or you lose feeling in your foot. °· Your pain is not relieved with medicine. °SEEK IMMEDIATE MEDICAL CARE IF: °· Your toes are numb or blue. °· You have severe pain that is increasing. °MAKE SURE YOU:  °· Understand these instructions. °· Will watch your condition. °· Will get help right away if you are not doing well or get worse. °Document Released: 05/13/2005 Document Revised: 02/05/2012 Document Reviewed:   05/25/2011 ExitCare Patient Information 2015 [place], [place]. This information is not intended to replace advice given to you by your health care provider. Make sure you discuss any questions you have with your health care provider.

## 2015-01-20 NOTE — ED Notes (Signed)
Pt states swelling to left ankle after falling down several steps last night. NAD. Pt also states mild right hip pain, stating she believes the pain is coming from having to put more of her weight to the right side while walking.

## 2015-01-20 NOTE — ED Provider Notes (Signed)
CSN: [id number]     Arrival date & time 01/20/15  1512 History   First MD Initiated Contact with Patient 01/20/15 1517     Chief Complaint  Patient presents with  . Ankle Pain     (Consider location/radiation/quality/duration/timing/severity/associated sxs/prior Treatment) HPI Comments: Pt comes in with c/o left ankle and foot pain after fall down 7 outside stairs last night. No loc. She states that pain seems to be getting worse. Denies numbness. States that she can't wt bear without hobbling. No previous injury  The history is provided by the patient. No language interpreter was used.    Past Medical History  Diagnosis Date  . Chicken pox   . Migraines   . Allergy   . Hay fever   . UTI (urinary tract infection)   . Anxiety   . Irregular bleeding 12/30/2012  . BV (bacterial vaginosis) 12/30/2012  . Postcoital bleeding 05/13/2013    Had [place] and IUD in place, bleeding stopped after doxycycline but started back, has normal period lite x 2 days but bleeds every time with sex, is bright red and has clots and cramps after sex   . Contraceptive management 07/15/2013  . PONV (postoperative nausea and vomiting)   . History of kidney stones    Past Surgical History  Procedure Laterality Date  . Tonsillectomy    . Dilation and curettage of uterus    . Cholecystectomy N/A 03/09/2014    Procedure: LAPAROSCOPIC CHOLECYSTECTOMY;  Surgeon: Dalia Heading, MD;  Location: [facility];  Service: General;  Laterality: N/A;   Family History  Problem Relation Age of Onset  . Arthritis Mother   . Lung cancer Mother   . Hyperlipidemia Mother   . Atrial fibrillation Mother   . Stroke Mother   . Hypertension Mother   . Diabetes Father   . Dementia Father   . Breast cancer Maternal Aunt   . Diabetes Paternal Uncle    Social History  Substance Use Topics  . Smoking status: Never Smoker   . Smokeless tobacco: Never Used  . Alcohol Use: No   OB History    Gravida Para Term Preterm AB TAB SAB  Ectopic Multiple Living   4 3   1  1   3      Review of Systems  All other systems reviewed and are negative.     Allergies  Bee venom; Codeine; Milk-related compounds; and Penicillins  Home Medications   Prior to Admission medications   Medication Sig Start Date End Date Taking? Authorizing Provider  LORazepam (ATIVAN) 0.5 MG tablet Take 1 tablet (0.5 mg total) by mouth every 8 (eight) hours as needed for anxiety. 07/29/14   Nelwyn Salisbury, MD  medroxyPROGESTERone (DEPO-PROVERA) 150 MG/ML injection INJECT INTO MUSCLE EVERY 3 MONTHS. 09/21/14   Adline Potter, NP  phentermine 37.5 MG capsule Take 1 capsule (37.5 mg total) by mouth every morning. Patient taking differently: Take 37.5 mg by mouth. Takes it twice a week 09/14/14   Nelwyn Salisbury, MD   BP 123/88 mmHg  Pulse 77  Temp(Src) 97.6 F (36.4 C) (Oral)  Resp 18  Ht 5' 4.5" (1.638 m)  Wt 198 lb (89.812 kg)  BMI 33.47 kg/m2  SpO2 96% Physical Exam  Constitutional: She is oriented to person, place, and time. She appears well-developed and well-nourished.  Cardiovascular: Normal rate and regular rhythm.   Pulmonary/Chest: Effort normal and breath sounds normal.  Abdominal: Soft. Bowel sounds are normal.  Musculoskeletal: Normal range  of motion.       Cervical back: Normal.       Thoracic back: Normal.       Lumbar back: Normal.  Generalized tenderness to left ankle and foot. No gross deformity noted. Pulses intact  Neurological: She is alert and oriented to person, place, and time. Coordination normal.  Skin:  Abrasion to the left foot  Nursing note and vitals reviewed.   ED Course  Procedures (including critical care time) Labs Review Labs Reviewed - No data to display  Imaging Review No results found. I have personally reviewed and evaluated these images and lab results as part of my medical decision-making.   EKG Interpretation None      MDM   Final diagnoses:  Ankle sprain, left, initial encounter   Abrasion  Left foot pain    No acute bony injury noted. Pt is neurovascularly intact. Pt given crutches and air cast for comfort    Teressa Lower, NP 01/20/15 1549  Marily Memos, MD 01/20/15 [phone]

## 2015-03-03 ENCOUNTER — Other Ambulatory Visit: Payer: 59 | Admitting: Adult Health

## 2015-03-06 ENCOUNTER — Encounter: Payer: Self-pay | Admitting: *Deleted

## 2015-03-06 ENCOUNTER — Ambulatory Visit: Payer: 59

## 2015-03-10 ENCOUNTER — Ambulatory Visit (INDEPENDENT_AMBULATORY_CARE_PROVIDER_SITE_OTHER): Payer: 59 | Admitting: *Deleted

## 2015-03-10 ENCOUNTER — Encounter: Payer: Self-pay | Admitting: *Deleted

## 2015-03-10 DIAGNOSIS — Z3042 Encounter for surveillance of injectable contraceptive: Secondary | ICD-10-CM

## 2015-03-10 DIAGNOSIS — Z3202 Encounter for pregnancy test, result negative: Secondary | ICD-10-CM

## 2015-03-10 LAB — POCT URINE PREGNANCY: Preg Test, Ur: NEGATIVE

## 2015-03-10 MED ORDER — MEDROXYPROGESTERONE ACETATE 150 MG/ML IM SUSP
150.0000 mg | Freq: Once | INTRAMUSCULAR | Status: AC
  Administered 2015-03-10: 150 mg via INTRAMUSCULAR

## 2015-03-10 NOTE — Progress Notes (Signed)
Pt here for Depo. Reports no problems at this time. Does not want to continue Depo. Return in 11 weeks for Pap and discuss changing birth control. [name]

## 2015-04-24 ENCOUNTER — Encounter: Payer: Self-pay | Admitting: Family Medicine

## 2015-04-24 ENCOUNTER — Ambulatory Visit (INDEPENDENT_AMBULATORY_CARE_PROVIDER_SITE_OTHER): Payer: 59 | Admitting: Family Medicine

## 2015-04-24 VITALS — BP 110/78 | HR 80 | Temp 98.9°F

## 2015-04-24 DIAGNOSIS — F900 Attention-deficit hyperactivity disorder, predominantly inattentive type: Secondary | ICD-10-CM | POA: Diagnosis not present

## 2015-04-24 DIAGNOSIS — F909 Attention-deficit hyperactivity disorder, unspecified type: Secondary | ICD-10-CM | POA: Insufficient documentation

## 2015-04-24 DIAGNOSIS — E669 Obesity, unspecified: Secondary | ICD-10-CM

## 2015-04-24 DIAGNOSIS — F419 Anxiety disorder, unspecified: Secondary | ICD-10-CM

## 2015-04-24 MED ORDER — METHYLPHENIDATE HCL ER (LA) 20 MG PO CP24: 20.0000 mg | ORAL_CAPSULE | ORAL | Status: DC

## 2015-04-24 NOTE — Progress Notes (Signed)
   Subjective:    Patient ID: Audrey [name], female    DOB: 1985/09/17, 29 y.o.   MRN: [id number]  HPI Here to discuss her anxiety and her attempts to lose weight. She tried Lorazepam but even the 0.5 mg dose was too sedating and she had to stop it. She was able to lose 27 lbs while taking Phentermine, but she gained it back. She had tried DepoProvera for about one year, but she stopped this because it seemed to caused her to gain the weight back. She has discussed this with her GYN, and it sounds like she will try a low dose BCP sometime soon. She also asks if she could be dealing with some ADHD symptoms. She actually was treated for ADHD as a young child, and she took Ritalin from the 1st grade through the 7th grade for this. This problem seemed to improve as she got older, but now she is struggling again with concentration and focus. She finds to hard to finish  tasks at work and at home, and she thinks the stress of always being behind and of being disorganized has added to her anxiety issue.    Review of Systems  Constitutional: Negative.   Respiratory: Negative.   Cardiovascular: Negative.   Neurological: Negative.   Psychiatric/Behavioral: Positive for decreased concentration. Negative for hallucinations, behavioral problems, confusion, dysphoric mood and agitation. The patient is nervous/anxious.        Objective:   Physical Exam  Constitutional: She is oriented to person, place, and time. She appears well-developed and well-nourished. No distress.  Cardiovascular: Normal rate, regular rhythm, normal heart sounds and intact distal pulses.   Pulmonary/Chest: Effort normal and breath sounds normal.  Neurological: She is alert and oriented to person, place, and time.  Psychiatric: She has a normal mood and affect. Her behavior is normal. Thought content normal.          Assessment & Plan:  She still has ADHD symptoms as an adult so we will try her on Ritalin LA 20 mg daily. This  may also help the anxiety issue if she can feel more organized, and we will try to avoid anxiety meds if possible. This may also help the weight issue since it can curb the appetite. We will recheck in one month.

## 2015-04-24 NOTE — Progress Notes (Signed)
Pre visit review using our clinic review tool, if applicable. No additional management support is needed unless otherwise documented below in the visit note. Pt declined to weigh 

## 2015-04-26 ENCOUNTER — Telehealth: Payer: Self-pay | Admitting: Family Medicine

## 2015-04-26 MED ORDER — AMPHETAMINE-DEXTROAMPHET ER 20 MG PO CP24: 20.0000 mg | ORAL_CAPSULE | ORAL | Status: DC

## 2015-04-26 NOTE — Telephone Encounter (Signed)
Pt needs PA

## 2015-04-26 NOTE — Telephone Encounter (Signed)
Pt said her ins will not cover ritalin. Pt needs new rx for adderall time release . Pt was seen on 04/24/15

## 2015-04-26 NOTE — Telephone Encounter (Signed)
Noted. Switch to adderall XR 20 mg daily

## 2015-04-27 NOTE — Telephone Encounter (Signed)
Script is ready for pick up and I left a voice message with below information. 

## 2015-05-03 ENCOUNTER — Telehealth: Payer: Self-pay | Admitting: Family Medicine

## 2015-05-03 NOTE — Telephone Encounter (Signed)
I am confused because I already wrote for Adderall XR last week. Did she try this yet?

## 2015-05-03 NOTE — Telephone Encounter (Signed)
PA for Methylphenidate ER was denied. Patient must try and fail all of the following:  Brand or generic Adderall XR Brand or generic Concerta Brand or generic Metadate CD

## 2015-05-04 NOTE — Telephone Encounter (Signed)
I am sorry - this denial was old. I was out of the office and just playing catch up. I did not see the previous phone note. My apologies.

## 2015-05-11 ENCOUNTER — Telehealth: Payer: Self-pay | Admitting: Family Medicine

## 2015-05-11 MED ORDER — AMPHETAMINE-DEXTROAMPHET ER 30 MG PO CP24: 30.0000 mg | ORAL_CAPSULE | ORAL | Status: DC

## 2015-05-11 NOTE — Telephone Encounter (Signed)
Script is ready for pick up and I left a voice message for pt. 

## 2015-05-11 NOTE — Telephone Encounter (Signed)
Pt call to request an increase in the following med   amphetamine-dextroamphetamine (ADDERALL XR) 20 MG 24 hr capsule  She is asking if it can increase to 30mg     Phamacy:

## 2015-05-11 NOTE — Telephone Encounter (Signed)
Done

## 2015-05-11 NOTE — Telephone Encounter (Signed)
Pt last visit 04/24/15 Pt last Rx refill 04/26/15 #30

## 2015-05-18 ENCOUNTER — Telehealth: Payer: Self-pay | Admitting: *Deleted

## 2015-05-23 NOTE — Telephone Encounter (Signed)
Left message x 2. [name] It looks like pt has been on Depo and not on Yaz recently. Pt will need to be seen 1st. [name]

## 2015-05-24 NOTE — Telephone Encounter (Signed)
Spoke with pt letting her know she would need to be seen before switching from Depo to [facility]. Pt hasn't been on Yaz recently and it's time for her yearly. Pt voiced understanding and call was transferred to front desk for appt. [name]

## 2015-05-30 ENCOUNTER — Other Ambulatory Visit: Payer: 59 | Admitting: Advanced Practice Midwife

## 2015-06-22 ENCOUNTER — Other Ambulatory Visit: Payer: Self-pay | Admitting: Family Medicine

## 2015-06-22 NOTE — Telephone Encounter (Signed)
She should not take phentermine at the same time as Adderall. The Adderall itself is a potent apettite suppressant

## 2015-06-23 ENCOUNTER — Telehealth: Payer: Self-pay | Admitting: Family Medicine

## 2015-06-23 MED ORDER — PHENTERMINE HCL 37.5 MG PO CAPS: 37.5000 mg | ORAL_CAPSULE | ORAL | Status: DC

## 2015-06-23 NOTE — Telephone Encounter (Signed)
Pt does not want to continue taking adderall. Pt would like to go back on amphetamine  capsules 37.5 mg. Pharm  [facility] [place]

## 2015-06-23 NOTE — Telephone Encounter (Signed)
I called in script, asked pharmacy to discontinue Adderall from pt list and left a voice message for pt with below information.

## 2015-06-23 NOTE — Telephone Encounter (Signed)
Stop the Adderall. Call in Phentermine 37.5 mg daily, #30 with 5 rf

## 2015-06-29 ENCOUNTER — Telehealth: Payer: Self-pay | Admitting: Family Medicine

## 2015-06-29 NOTE — Telephone Encounter (Signed)
Left patietn a breif message for her to call our office back. If The patient calls back the message is to let her know that [employer] approved her Phentermine HCI for 06/26/2015 - 09/18/2015

## 2015-07-04 ENCOUNTER — Other Ambulatory Visit: Payer: Self-pay | Admitting: Adult Health

## 2015-08-24 ENCOUNTER — Ambulatory Visit (INDEPENDENT_AMBULATORY_CARE_PROVIDER_SITE_OTHER): Payer: BLUE CROSS/BLUE SHIELD | Admitting: Advanced Practice Midwife

## 2015-08-24 ENCOUNTER — Other Ambulatory Visit: Payer: Self-pay | Admitting: Advanced Practice Midwife

## 2015-08-24 ENCOUNTER — Other Ambulatory Visit: Payer: Self-pay

## 2015-08-24 ENCOUNTER — Encounter: Payer: Self-pay | Admitting: Advanced Practice Midwife

## 2015-08-24 ENCOUNTER — Other Ambulatory Visit (HOSPITAL_COMMUNITY)
Admission: RE | Admit: 2015-08-24 | Discharge: 2015-08-24 | Disposition: A | Discharge status: Home / Self Care | Payer: BLUE CROSS/BLUE SHIELD | Source: Ambulatory Visit | Attending: Advanced Practice Midwife | Admitting: Advanced Practice Midwife

## 2015-08-24 VITALS — BP 122/90 | HR 86 | Ht 64.0 in | Wt 242.0 lb

## 2015-08-24 DIAGNOSIS — Z1151 Encounter for screening for human papillomavirus (HPV): Secondary | ICD-10-CM | POA: Insufficient documentation

## 2015-08-24 DIAGNOSIS — Z01419 Encounter for gynecological examination (general) (routine) without abnormal findings: Secondary | ICD-10-CM | POA: Insufficient documentation

## 2015-08-24 DIAGNOSIS — Z3042 Encounter for surveillance of injectable contraceptive: Secondary | ICD-10-CM

## 2015-08-24 LAB — BETA HCG QUANT (REF LAB): hCG Quant: <1 m[IU]/mL

## 2015-08-24 MED ORDER — LEVONORGEST-ETH ESTRAD 91-DAY 0.15-0.03 MG PO TABS: 1.0000 | ORAL_TABLET | Freq: Every day | ORAL | Status: DC

## 2015-08-24 MED ORDER — MEDROXYPROGESTERONE ACETATE 150 MG/ML IM SUSP: 150.0000 mg | INTRAMUSCULAR | Status: DC

## 2015-08-24 NOTE — Progress Notes (Signed)
Audrey [name] 30 y.o.  Filed Vitals:   08/24/15 1619  BP: 122/90  Pulse: 86     Filed Weights   08/24/15 1619  Weight: 242 lb (109.77 kg)    Past Medical History: Past Medical History  Diagnosis Date  . Chicken pox   . Migraines   . Allergy   . Hay fever   . UTI (urinary tract infection)   . Anxiety   . Irregular bleeding 12/30/2012  . BV (bacterial vaginosis) 12/30/2012  . Postcoital bleeding 05/13/2013    Had US and IUD in place, bleeding stopped after doxycycline but started back, has normal period lite x 2 days but bleeds every time with sex, is bright red and has clots and cramps after sex   . Contraceptive management 07/15/2013  . PONV (postoperative nausea and vomiting)   . History of kidney stones     Past Surgical History: Past Surgical History  Procedure Laterality Date  . Tonsillectomy    . Dilation and curettage of uterus    . Cholecystectomy N/A 03/09/2014    Procedure: LAPAROSCOPIC CHOLECYSTECTOMY;  Surgeon: Dalia HeadingMark A Jenkins, MD;  Location: [facility];  Service: General;  Laterality: N/A;    Family History: Family History  Problem Relation Age of Onset  . Arthritis Mother   . Lung cancer Mother   . Hyperlipidemia Mother   . Atrial fibrillation Mother   . Stroke Mother   . Hypertension Mother   . Diabetes Father   . Dementia Father   . Breast cancer Maternal Aunt   . Diabetes Paternal Uncle     Social History: Social History  Substance Use Topics  . Smoking status: Never Smoker   . Smokeless tobacco: Never Used  . Alcohol Use: No    Allergies:  Allergies  Allergen Reactions  . Bee Venom Shortness Of Breath  . Codeine Itching  . Milk-Related Compounds   . Penicillins Hives      Current outpatient prescriptions:  .  levonorgestrel-ethinyl estradiol (SEASONALE,INTROVALE,JOLESSA) 0.15-0.03 MG tablet, Take 1 tablet by mouth daily., Disp: 1 Package, Rfl: 4  History of Present Illness: here for pap and physical.   Doesn't like depo (gained  wt), IUD (had to have cryo d/t cervical irritaio) nexplanon or nuva ring.  Wants pills, requests seasonale. Last pap 11/14, normal  Review of Systems   Patient denies any headaches, blurred vision, shortness of breath, chest pain, abdominal pain, problems with bowel movements, urination, or intercourse.   Physical Exam: General:  Well developed, well nourished, no acute distress Skin:  Warm and dry Neck:  Midline trachea, normal thyroid Lungs; Clear to auscultation bilaterally Breast:  No dominant palpable mass, retraction, or nipple discharge Cardiovascular: Regular rate and rhythm Abdomen:  Soft, non tender, no hepatosplenomegaly Pelvic:  External genitalia is normal in appearance.  The vagina is normal in appearance.  The cervix is bulbous.  Uterus is felt to be normal size, shape, and contour.  No adnexal masses or tenderness noted.  Extremities:  No swelling or varicosities noted Psych:  No mood changes.     Impression: normal gyn exam     Plan: Start seasnonale today  If pap noraml, repeat in 3 years

## 2015-08-28 LAB — CYTOLOGY - PAP

## 2015-11-27 ENCOUNTER — Telehealth: Payer: Self-pay | Admitting: Obstetrics and Gynecology

## 2015-11-27 ENCOUNTER — Telehealth: Payer: Self-pay | Admitting: Family Medicine

## 2015-11-27 NOTE — Telephone Encounter (Signed)
Pt was last seen nov 2016. Pt has a sinus inf and unable to come in today due to staff shortage [facility]

## 2015-11-27 NOTE — Telephone Encounter (Signed)
Per Dr. Clent RidgesFry pt needs OV, maybe she can do a e-visit with doctor of the day.

## 2015-11-27 NOTE — Telephone Encounter (Signed)
Pt states that she has had congestion and headache since last Friday. Pt states that she has tried Ibuprofen and tylenol and it doesn't really help. Pt states that she had also used a nettie pot and has not had any relief from that either. I spoke with Dr. Emelda FearFerguson and he advised the pt should try a decongestant instead of antibiotic at this time. Pt was advised to try Mucinex or Sudafed to see if they help and if not call us back on Wednesday and we can go from there. Pt verbalized understanding.

## 2015-11-27 NOTE — Telephone Encounter (Signed)
Pt will call back to sch.

## 2016-01-30 ENCOUNTER — Encounter: Payer: Self-pay | Admitting: Family Medicine

## 2016-01-30 ENCOUNTER — Ambulatory Visit (INDEPENDENT_AMBULATORY_CARE_PROVIDER_SITE_OTHER): Payer: BLUE CROSS/BLUE SHIELD | Admitting: Family Medicine

## 2016-01-30 VITALS — BP 120/70 | Temp 98.2°F | Ht 64.0 in | Wt 246.0 lb

## 2016-01-30 DIAGNOSIS — J019 Acute sinusitis, unspecified: Secondary | ICD-10-CM | POA: Diagnosis not present

## 2016-01-30 MED ORDER — AZITHROMYCIN 250 MG PO TABS: ORAL_TABLET | ORAL | 0 refills | Status: DC

## 2016-01-30 NOTE — Progress Notes (Signed)
   Subjective:    Patient ID: Audrey [name], female    DOB: [birth year], 30 y.o.   MRN: [id number]  HPI Here for 6 days of sinus pressure, headaches, earaches, and PND. No ST or fever or cough. She always has trouble with allergies in the fall. She tried Excedrin Migraine with no effect.    Review of Systems  Constitutional: Negative.   HENT: Positive for congestion, ear pain, postnasal drip and sinus pressure. Negative for sore throat and voice change.   Eyes: Positive for itching. Negative for redness.  Respiratory: Negative.        Objective:   Physical Exam  Constitutional: She appears well-developed and well-nourished.  HENT:  Right Ear: External ear normal.  Left Ear: External ear normal.  Nose: Nose normal.  Mouth/Throat: Oropharynx is clear and moist.  Eyes: Conjunctivae are normal.  Neck: No thyromegaly present.  Pulmonary/Chest: Effort normal and breath sounds normal.  Lymphadenopathy:    She has no cervical adenopathy.          Assessment & Plan:  Sinusitis on top of allergies, treat with a ZPack. Use Mucinex D prn.  Nelwyn SalisburyFRY,STEPHEN A, MD

## 2016-01-30 NOTE — Progress Notes (Signed)
Pre visit review using our clinic review tool, if applicable. No additional management support is needed unless otherwise documented below in the visit note. 

## 2016-02-17 ENCOUNTER — Other Ambulatory Visit: Payer: Self-pay | Admitting: Family Medicine

## 2016-02-19 NOTE — Telephone Encounter (Signed)
Call in #[phone] with 5 rf 

## 2016-04-26 ENCOUNTER — Ambulatory Visit (INDEPENDENT_AMBULATORY_CARE_PROVIDER_SITE_OTHER): Payer: Medicaid Other | Admitting: Adult Health

## 2016-04-26 ENCOUNTER — Encounter: Payer: Self-pay | Admitting: Adult Health

## 2016-04-26 VITALS — BP 118/80 | HR 92 | Ht 65.0 in | Wt 256.5 lb

## 2016-04-26 DIAGNOSIS — O3680X0 Pregnancy with inconclusive fetal viability, not applicable or unspecified: Secondary | ICD-10-CM

## 2016-04-26 DIAGNOSIS — R635 Abnormal weight gain: Secondary | ICD-10-CM

## 2016-04-26 DIAGNOSIS — N926 Irregular menstruation, unspecified: Secondary | ICD-10-CM

## 2016-04-26 DIAGNOSIS — Z3201 Encounter for pregnancy test, result positive: Secondary | ICD-10-CM

## 2016-04-26 DIAGNOSIS — Z3490 Encounter for supervision of normal pregnancy, unspecified, unspecified trimester: Secondary | ICD-10-CM

## 2016-04-26 LAB — POCT URINE PREGNANCY: Preg Test, Ur: POSITIVE — AB

## 2016-04-26 MED ORDER — FLINTSTONES COMPLETE 60 MG PO CHEW: 1.0000 | CHEWABLE_TABLET | Freq: Every day | ORAL | Status: DC

## 2016-04-26 NOTE — Progress Notes (Signed)
Subjective:     Patient ID: Ennis FortsAmber N Streck, female   DOB: 1985-11-17, 30 y.o.   MRN: [id number]  HPI Joice Loftsmber is a 30 year old white female married in for UPT, missed a period and has had 11+HPT.[place] and wonders if got pregnant then. Has gained about 30 lbs in last year.   Review of Systems +missed period +weight gain in last year of about 30 lbs Reviewed past medical,surgical, social and family history. Reviewed medications and allergies.     Objective:   Physical Exam BP 118/80 (BP Location: Left Arm, Patient Position: Sitting, Cuff Size: Large)   Pulse 92   Ht 5\' 5"  (1.651 m)   Wt 256 lb 8 oz (116.3 kg)   LMP 03/25/2016 (Exact Date)   BMI 42.68 kg/m UPT +, about 4+4 weeks by LMP with EDD 12/31/16.Skin warm and dry. Neck: mid line trachea, normal thyroid, good ROM, no lymphadenopathy noted. Lungs: clear to ausculation bilaterally. Cardiovascular: regular rate and rhythm.Abdomen is soft and non tender. PHQ 2 score 0.Try to eat healthy.     Assessment:     1. Pregnancy examination or test, positive result   2. Pregnancy, unspecified gestational age   643. Encounter to determine fetal viability of pregnancy, single or unspecified fetus       Plan:    Take flintstones Return 12/20 for US and intake visit Review handout on first trimester

## 2016-04-26 NOTE — Patient Instructions (Signed)
First Trimester of Pregnancy  The first trimester of pregnancy is from week 1 until the end of week 12 (months 1 through 3). A week after a sperm fertilizes an egg, the egg will implant on the wall of the uterus. This embryo will begin to develop into a baby. Genes from you and your partner are forming the baby. The female genes determine whether the baby is a boy or a girl. At 6-8 weeks, the eyes and face are formed, and the heartbeat can be seen on ultrasound. At the end of 12 weeks, all the baby's organs are formed.   Now that you are pregnant, you will want to do everything you can to have a healthy baby. Two of the most important things are to get good prenatal care and to follow your health care provider's instructions. Prenatal care is all the medical care you receive before the baby's birth. This care will help prevent, find, and treat any problems during the pregnancy and childbirth.  BODY CHANGES  Your body goes through many changes during pregnancy. The changes vary from woman to woman.   · You may gain or lose a couple of pounds at first.  · You may feel sick to your stomach (nauseous) and throw up (vomit). If the vomiting is uncontrollable, call your health care provider.  · You may tire easily.  · You may develop headaches that can be relieved by medicines approved by your health care provider.  · You may urinate more often. Painful urination may mean you have a bladder infection.  · You may develop heartburn as a result of your pregnancy.  · You may develop constipation because certain hormones are causing the muscles that push waste through your intestines to slow down.  · You may develop hemorrhoids or swollen, bulging veins (varicose veins).  · Your breasts may begin to grow larger and become tender. Your nipples may stick out more, and the tissue that surrounds them (areola) may become darker.  · Your gums may bleed and may be sensitive to brushing and flossing.   · Dark spots or blotches (chloasma, mask of pregnancy) may develop on your face. This will likely fade after the baby is born.  · Your menstrual periods will stop.  · You may have a loss of appetite.  · You may develop cravings for certain kinds of food.  · You may have changes in your emotions from day to day, such as being excited to be pregnant or being concerned that something may go wrong with the pregnancy and baby.  · You may have more vivid and strange dreams.  · You may have changes in your hair. These can include thickening of your hair, rapid growth, and changes in texture. Some women also have hair loss during or after pregnancy, or hair that feels dry or thin. Your hair will most likely return to normal after your baby is born.  WHAT TO EXPECT AT YOUR PRENATAL VISITS  During a routine prenatal visit:  · You will be weighed to make sure you and the baby are growing normally.  · Your blood pressure will be taken.  · Your abdomen will be measured to track your baby's growth.  · The fetal heartbeat will be listened to starting around week 10 or 12 of your pregnancy.  · Test results from any previous visits will be discussed.  Your health care provider may ask you:  · How you are feeling.  · If you   are feeling the baby move.  · If you have had any abnormal symptoms, such as leaking fluid, bleeding, severe headaches, or abdominal cramping.  · If you are using any tobacco products, including cigarettes, chewing tobacco, and electronic cigarettes.  · If you have any questions.  Other tests that may be performed during your first trimester include:  · Blood tests to find your blood type and to check for the presence of any previous infections. They will also be used to check for low iron levels (anemia) and Rh antibodies. Later in the pregnancy, blood tests for diabetes will be done along with other tests if problems develop.  · Urine tests to check for infections, diabetes, or protein in the urine.   · An ultrasound to confirm the proper growth and development of the baby.  · An amniocentesis to check for possible genetic problems.  · Fetal screens for spina bifida and Down syndrome.  · You may need other tests to make sure you and the baby are doing well.  · HIV (human immunodeficiency virus) testing. Routine prenatal testing includes screening for HIV, unless you choose not to have this test.  HOME CARE INSTRUCTIONS   Medicines  · Follow your health care provider's instructions regarding medicine use. Specific medicines may be either safe or unsafe to take during pregnancy.  · Take your prenatal vitamins as directed.  · If you develop constipation, try taking a stool softener if your health care provider approves.  Diet  · Eat regular, well-balanced meals. Choose a variety of foods, such as meat or vegetable-based protein, fish, milk and low-fat dairy products, vegetables, fruits, and whole grain breads and cereals. Your health care provider will help you determine the amount of weight gain that is right for you.  · Avoid raw meat and uncooked cheese. These carry germs that can cause birth defects in the baby.  · Eating four or five small meals rather than three large meals a day may help relieve nausea and vomiting. If you start to feel nauseous, eating a few soda crackers can be helpful. Drinking liquids between meals instead of during meals also seems to help nausea and vomiting.  · If you develop constipation, eat more high-fiber foods, such as fresh vegetables or fruit and whole grains. Drink enough fluids to keep your urine clear or pale yellow.  Activity and Exercise  · Exercise only as directed by your health care provider. Exercising will help you:    Control your weight.    Stay in shape.    Be prepared for labor and delivery.  · Experiencing pain or cramping in the lower abdomen or low back is a good sign that you should stop exercising. Check with your health care provider  before continuing normal exercises.  · Try to avoid standing for long periods of time. Move your legs often if you must stand in one place for a long time.  · Avoid heavy lifting.  · Wear low-heeled shoes, and practice good posture.  · You may continue to have sex unless your health care provider directs you otherwise.  Relief of Pain or Discomfort  · Wear a good support bra for breast tenderness.    · Take warm sitz baths to soothe any pain or discomfort caused by hemorrhoids. Use hemorrhoid cream if your health care provider approves.    · Rest with your legs elevated if you have leg cramps or low back pain.  · If you develop varicose veins in your   legs, wear support hose. Elevate your feet for 15 minutes, 3-4 times a day. Limit salt in your diet.  Prenatal Care  · Schedule your prenatal visits by the twelfth week of pregnancy. They are usually scheduled monthly at first, then more often in the last 2 months before delivery.  · Write down your questions. Take them to your prenatal visits.  · Keep all your prenatal visits as directed by your health care provider.  Safety  · Wear your seat belt at all times when driving.  · Make a list of emergency phone numbers, including numbers for family, friends, the hospital, and police and fire departments.  General Tips  · Ask your health care provider for a referral to a local prenatal education class. Begin classes no later than at the beginning of month 6 of your pregnancy.  · Ask for help if you have counseling or nutritional needs during pregnancy. Your health care provider can offer advice or refer you to specialists for help with various needs.  · Do not use hot tubs, steam rooms, or saunas.  · Do not douche or use tampons or scented sanitary pads.  · Do not cross your legs for long periods of time.  · Avoid cat litter boxes and soil used by cats. These carry germs that can cause birth defects in the baby and possibly loss of the fetus by miscarriage or stillbirth.   · Avoid all smoking, herbs, alcohol, and medicines not prescribed by your health care provider. Chemicals in these affect the formation and growth of the baby.  · Do not use any tobacco products, including cigarettes, chewing tobacco, and electronic cigarettes. If you need help quitting, ask your health care provider. You may receive counseling support and other resources to help you quit.  · Schedule a dentist appointment. At home, brush your teeth with a soft toothbrush and be gentle when you floss.  SEEK MEDICAL CARE IF:   · You have dizziness.  · You have mild pelvic cramps, pelvic pressure, or nagging pain in the abdominal area.  · You have persistent nausea, vomiting, or diarrhea.  · You have a bad smelling vaginal discharge.  · You have pain with urination.  · You notice increased swelling in your face, hands, legs, or ankles.  SEEK IMMEDIATE MEDICAL CARE IF:   · You have a fever.  · You are leaking fluid from your vagina.  · You have spotting or bleeding from your vagina.  · You have severe abdominal cramping or pain.  · You have rapid weight gain or loss.  · You vomit blood or material that looks like coffee grounds.  · You are exposed to German measles and have never had them.  · You are exposed to fifth disease or chickenpox.  · You develop a severe headache.  · You have shortness of breath.  · You have any kind of trauma, such as from a fall or a car accident.     This information is not intended to replace advice given to you by your health care provider. Make sure you discuss any questions you have with your health care provider.     Document Released: 05/07/2001 Document Revised: 06/03/2014 Document Reviewed: 03/23/2013  Elsevier Interactive Patient Education ©2017 [employer]

## 2016-05-13 ENCOUNTER — Telehealth: Payer: Self-pay | Admitting: Adult Health

## 2016-05-13 MED ORDER — PROMETHAZINE HCL 25 MG PO TABS: 25.0000 mg | ORAL_TABLET | Freq: Four times a day (QID) | ORAL | 1 refills | Status: DC | PRN

## 2016-05-13 NOTE — Telephone Encounter (Signed)
Left message x 1. [name] 

## 2016-05-13 NOTE — Telephone Encounter (Signed)
Spoke with pt. Pt is having a lot of nausea. Pt requests something to help. Pt is eating crackers and peppermints. Can you order Phenergan?

## 2016-05-13 NOTE — Telephone Encounter (Signed)
Left message x 2. [name] 

## 2016-05-13 NOTE — Telephone Encounter (Signed)
Will order phenergan for nausea

## 2016-05-13 NOTE — Telephone Encounter (Signed)
Pt called stating that she would like a refill of a medication before her appointment. Please contact pt

## 2016-05-15 ENCOUNTER — Encounter: Payer: Self-pay | Admitting: *Deleted

## 2016-05-15 ENCOUNTER — Ambulatory Visit (INDEPENDENT_AMBULATORY_CARE_PROVIDER_SITE_OTHER): Payer: Medicaid Other

## 2016-05-15 ENCOUNTER — Other Ambulatory Visit: Payer: BLUE CROSS/BLUE SHIELD

## 2016-05-15 ENCOUNTER — Ambulatory Visit (INDEPENDENT_AMBULATORY_CARE_PROVIDER_SITE_OTHER): Payer: Medicaid Other | Admitting: *Deleted

## 2016-05-15 VITALS — BP 124/88 | HR 99 | Wt 257.0 lb

## 2016-05-15 DIAGNOSIS — Z3481 Encounter for supervision of other normal pregnancy, first trimester: Secondary | ICD-10-CM | POA: Diagnosis not present

## 2016-05-15 DIAGNOSIS — Z1389 Encounter for screening for other disorder: Secondary | ICD-10-CM | POA: Diagnosis not present

## 2016-05-15 DIAGNOSIS — O3680X0 Pregnancy with inconclusive fetal viability, not applicable or unspecified: Secondary | ICD-10-CM

## 2016-05-15 DIAGNOSIS — Z3490 Encounter for supervision of normal pregnancy, unspecified, unspecified trimester: Secondary | ICD-10-CM | POA: Insufficient documentation

## 2016-05-15 DIAGNOSIS — Z3A01 Less than 8 weeks gestation of pregnancy: Secondary | ICD-10-CM | POA: Diagnosis not present

## 2016-05-15 DIAGNOSIS — Z331 Pregnant state, incidental: Secondary | ICD-10-CM | POA: Diagnosis not present

## 2016-05-15 LAB — POCT URINALYSIS DIPSTICK
GLUCOSE UA: NEGATIVE
KETONES UA: NEGATIVE
Leukocytes, UA: NEGATIVE
Nitrite, UA: POSITIVE
RBC UA: NEGATIVE

## 2016-05-15 NOTE — Progress Notes (Signed)
Dating US today. Single IUP with FHR 135 bpm.  CRL measures 7.7 mm which correlates to a GA of 6+4 weeks.  EDD 01/04/17 by today's US. Bilateral ovaries appear normal.

## 2016-05-15 NOTE — Progress Notes (Signed)
Audrey [name] is a 30 y.o. [phone] female here today for initial OB intake/educational visit with RN  Patient's medical, surgical, and obstetrical history obtained and reviewed.  Current medications and allergies also reviewed.   Dating ultrasound today revealed GA of [redacted]wk4d based on LMP. EDC 12/30/2016   LMP 03/25/2016 (Exact Date)   Patient Active Problem List   Diagnosis Date Noted  . Pregnancy 04/26/2016  . Attention deficit hyperactivity disorder (ADHD) 04/24/2015  . UTI (lower urinary tract infection) 08/24/2014  . Obesity 07/29/2014  . Leukorrhea 10/07/2013  . Contraceptive management 07/15/2013  . PCB (post coital bleeding) 07/08/2013  . Postcoital bleeding 05/13/2013  . Irregular bleeding 12/30/2012  . BV (bacterial vaginosis) 12/30/2012  . Anxiety 06/08/2012  . Migraines 09/25/2010   Past Medical History:  Diagnosis Date  . Allergy   . Anxiety   . BV (bacterial vaginosis) 12/30/2012  . Chicken pox   . Contraceptive management 07/15/2013  . Hay fever   . History of kidney stones   . Irregular bleeding 12/30/2012  . Migraines   . PONV (postoperative nausea and vomiting)   . Postcoital bleeding 05/13/2013   Had US and IUD in place, bleeding stopped after doxycycline but started back, has normal period lite x 2 days but bleeds every time with sex, is bright red and has clots and cramps after sex   . UTI (urinary tract infection)    Past Surgical History:  Procedure Laterality Date  . CHOLECYSTECTOMY N/A 03/09/2014   Procedure: LAPAROSCOPIC CHOLECYSTECTOMY;  Surgeon: Dalia HeadingMark A Jenkins, MD;  Location: [facility];  Service: General;  Laterality: N/A;  . DILATION AND CURETTAGE OF UTERUS    . TONSILLECTOMY     OB History    Gravida Para Term Preterm AB Living   5 3 2 1 1 3    SAB TAB Ectopic Multiple Live Births   1       3      She IS taking prenatal vitamins [facility] form completed PN1 labs drawn 17P Order form signed and faxed Baby scripts and mychart activated  Reviewed  recommended weight gain based on pre-gravid BMI  Genetic Screening discussed Integrated Screen: requested Cystic fibrosis screening discussed declined  Face-to-face time at least 30 minutes. 50% or more of this visit was spent in counseling and coordination of care.  Return in about 4 weeks (around 06/12/2016) for New OB.   Edward JollyMadren, Lakisa Lotz Michelle RN-C 05/15/2016 4:13 PM

## 2016-05-17 LAB — URINALYSIS, ROUTINE W REFLEX MICROSCOPIC
Bilirubin, UA: NEGATIVE
GLUCOSE, UA: NEGATIVE
Ketones, UA: NEGATIVE
NITRITE UA: POSITIVE — AB
PH UA: 5 (ref 5.0–7.5)
Protein, UA: NEGATIVE
Specific Gravity, UA: >=1.03 — AB (ref 1.005–1.030)
UUROB: 0.2 mg/dL (ref 0.2–1.0)

## 2016-05-17 LAB — PMP SCREEN PROFILE (10S), URINE
Amphetamine Screen, Ur: NEGATIVE ng/mL
BENZODIAZEPINE SCREEN, URINE: NEGATIVE ng/mL
Barbiturate Screen, Ur: NEGATIVE ng/mL
COCAINE(METAB.) SCREEN, URINE: NEGATIVE ng/mL
CREATININE(CRT), U: 280 mg/dL (ref 20.0–300.0)
Cannabinoids Ur Ql Scn: NEGATIVE ng/mL
Methadone Scn, Ur: NEGATIVE ng/mL
OPIATE SCRN UR: NEGATIVE ng/mL
OXYCODONE+OXYMORPHONE UR QL SCN: NEGATIVE ng/mL
PCP Scrn, Ur: NEGATIVE ng/mL
Ph of Urine: 5.4 (ref 4.5–8.9)
Propoxyphene, Screen: NEGATIVE ng/mL

## 2016-05-17 LAB — HEPATITIS B SURFACE ANTIGEN: Hepatitis B Surface Ag: NEGATIVE

## 2016-05-17 LAB — RPR: RPR Ser Ql: NONREACTIVE

## 2016-05-17 LAB — RUBELLA SCREEN: RUBELLA: 1.83 {index} (ref >0.99)

## 2016-05-17 LAB — MICROSCOPIC EXAMINATION: CASTS: NONE SEEN /LPF

## 2016-05-17 LAB — CBC
HEMATOCRIT: 39.3 % (ref 34.0–46.6)
HEMOGLOBIN: 13.7 g/dL (ref 11.1–15.9)
MCH: 30.6 pg (ref 26.6–33.0)
MCHC: 34.9 g/dL (ref 31.5–35.7)
MCV: 88 fL (ref 79–97)
Platelets: 316 10*3/uL (ref 150–379)
RBC: 4.47 x10E6/uL (ref 3.77–5.28)
RDW: 13 % (ref 12.3–15.4)
WBC: 7.4 10*3/uL (ref 3.4–10.8)

## 2016-05-17 LAB — ANTIBODY SCREEN: ANTIBODY SCREEN: NEGATIVE

## 2016-05-17 LAB — ABO/RH: Rh Factor: POSITIVE

## 2016-05-17 LAB — VARICELLA ZOSTER ANTIBODY, IGG: VARICELLA: 231 {index} (ref >165)

## 2016-05-17 LAB — HIV ANTIBODY (ROUTINE TESTING W REFLEX): HIV SCREEN 4TH GENERATION: NONREACTIVE

## 2016-05-24 ENCOUNTER — Telehealth: Payer: Self-pay | Admitting: *Deleted

## 2016-05-24 NOTE — Telephone Encounter (Signed)
Spoke with patient who states she no longer has [employer]. She has applied for pregnancy medicaid but has not received her card. I informed patient that we would try to send the form at her next appointment.

## 2016-05-24 NOTE — Telephone Encounter (Signed)
Left message on VM regarding needing insurance info to [facility]

## 2016-05-27 NOTE — L&D Delivery Note (Signed)
Patient is a 31 y.o. now [id number] who was admitted for IOL for pre-eclampsia with SF. She was on IV magnesium. S/p cytotec, FB, and Pitocin for IOL. AROM at 03:07 today with clear fluid ; now s/p NSVD at 139w4d.  Delivery Note At 6:53 AM a viable female was delivered via Vaginal, Spontaneous Delivery Presentation: ROA   APGAR: 8, 9 Weight  pending Placenta status: intact; to L&D Cord:  3-vessel  Anesthesia:  Epidural  Episiotomy: None Lacerations: 1st degree Perineal Suture Repair: 3.0 Vycril  Est. Blood Loss (mL): 100  Head delivered ROA. No nuchal cord present. Shoulder and body delivered in usual fashion. Infant to mother's abdomen. Infant with spontaneous cry. Cord clamped x 2 after 1-minute delay, and cut by family member. Cord blood drawn. Placenta delivered spontaneously with gentle cord traction. Fundus firm with massage and Pitocin. Perineum inspected and found to have 1st degree laceration, which was repaired with 3.0 Vycril, with good hemostasis achieved.  Mom to postpartum.  Baby to Couplet care / Skin to Skin.   Raynelle FanningJulie P. Korine Winton, MD OB Fellow 01/01/17, 7:23 AM

## 2016-05-28 ENCOUNTER — Telehealth: Payer: Self-pay | Admitting: *Deleted

## 2016-05-28 ENCOUNTER — Other Ambulatory Visit: Payer: Self-pay | Admitting: Advanced Practice Midwife

## 2016-05-28 ENCOUNTER — Inpatient Hospital Stay (HOSPITAL_COMMUNITY)
Admission: AD | Admit: 2016-05-28 | Discharge: 2016-05-28 | Disposition: A | Discharge status: Home / Self Care | Payer: Medicaid Other | Source: Ambulatory Visit | Attending: Family Medicine | Admitting: Family Medicine

## 2016-05-28 ENCOUNTER — Encounter (HOSPITAL_COMMUNITY): Payer: Self-pay | Admitting: *Deleted

## 2016-05-28 DIAGNOSIS — O2341 Unspecified infection of urinary tract in pregnancy, first trimester: Secondary | ICD-10-CM | POA: Diagnosis not present

## 2016-05-28 DIAGNOSIS — O2340 Unspecified infection of urinary tract in pregnancy, unspecified trimester: Secondary | ICD-10-CM | POA: Diagnosis not present

## 2016-05-28 DIAGNOSIS — O219 Vomiting of pregnancy, unspecified: Secondary | ICD-10-CM

## 2016-05-28 DIAGNOSIS — Z880 Allergy status to penicillin: Secondary | ICD-10-CM | POA: Diagnosis not present

## 2016-05-28 DIAGNOSIS — Z79899 Other long term (current) drug therapy: Secondary | ICD-10-CM | POA: Diagnosis not present

## 2016-05-28 DIAGNOSIS — Z3A00 Weeks of gestation of pregnancy not specified: Secondary | ICD-10-CM | POA: Insufficient documentation

## 2016-05-28 DIAGNOSIS — R1110 Vomiting, unspecified: Secondary | ICD-10-CM | POA: Diagnosis present

## 2016-05-28 LAB — URINALYSIS, ROUTINE W REFLEX MICROSCOPIC
Glucose, UA: NEGATIVE mg/dL
HGB URINE DIPSTICK: NEGATIVE
Ketones, ur: 5 mg/dL — AB
NITRITE: POSITIVE — AB
PH: 6 (ref 5.0–8.0)
Protein, ur: 100 mg/dL — AB
SPECIFIC GRAVITY, URINE: 1.032 — AB (ref 1.005–1.030)

## 2016-05-28 LAB — CBC WITH DIFFERENTIAL/PLATELET
Basophils Absolute: 0 10*3/uL (ref 0.0–0.1)
Basophils Relative: 0 %
EOS ABS: 0.1 10*3/uL (ref 0.0–0.7)
Eosinophils Relative: 1 %
HCT: 37.7 % (ref 36.0–46.0)
HEMOGLOBIN: 12.9 g/dL (ref 12.0–15.0)
LYMPHS ABS: 1.1 10*3/uL (ref 0.7–4.0)
LYMPHS PCT: 17 %
MCH: 29.8 pg (ref 26.0–34.0)
MCHC: 34.2 g/dL (ref 30.0–36.0)
MCV: 87.1 fL (ref 78.0–100.0)
MONOS PCT: 4 %
Monocytes Absolute: 0.3 10*3/uL (ref 0.1–1.0)
Neutro Abs: 5 10*3/uL (ref 1.7–7.7)
Neutrophils Relative %: 78 %
PLATELETS: 328 10*3/uL (ref 150–400)
RBC: 4.33 MIL/uL (ref 3.87–5.11)
RDW: 12.7 % (ref 11.5–15.5)
WBC: 6.4 10*3/uL (ref 4.0–10.5)

## 2016-05-28 MED ORDER — METOCLOPRAMIDE HCL 10 MG PO TABS: 10.0000 mg | ORAL_TABLET | Freq: Three times a day (TID) | ORAL | 1 refills | Status: DC

## 2016-05-28 MED ORDER — ONDANSETRON HCL 4 MG/2ML IJ SOLN
4.0000 mg | Freq: Once | INTRAMUSCULAR | Status: AC
  Administered 2016-05-28: 4 mg via INTRAVENOUS
  Filled 2016-05-28: qty 2

## 2016-05-28 MED ORDER — BUTALBITAL-APAP-CAFFEINE 50-325-40 MG PO TABS: 1.0000 | ORAL_TABLET | Freq: Four times a day (QID) | ORAL | 0 refills | Status: DC | PRN

## 2016-05-28 MED ORDER — ONDANSETRON 4 MG PO TBDP: 4.0000 mg | ORAL_TABLET | Freq: Three times a day (TID) | ORAL | 1 refills | Status: DC | PRN

## 2016-05-28 MED ORDER — METOCLOPRAMIDE HCL 5 MG/ML IJ SOLN
10.0000 mg | Freq: Once | INTRAMUSCULAR | Status: AC
  Administered 2016-05-28: 10 mg via INTRAVENOUS
  Filled 2016-05-28: qty 2

## 2016-05-28 MED ORDER — BUTALBITAL-APAP-CAFFEINE 50-325-40 MG PO TABS
2.0000 | ORAL_TABLET | Freq: Once | ORAL | Status: AC
  Administered 2016-05-28: 2 via ORAL
  Filled 2016-05-28: qty 2

## 2016-05-28 MED ORDER — DEXTROSE 5 % IV SOLN
2.0000 g | INTRAVENOUS | Status: DC
  Administered 2016-05-28: 2 g via INTRAVENOUS
  Filled 2016-05-28: qty 2

## 2016-05-28 MED ORDER — SODIUM CHLORIDE 0.9 % IV SOLN
INTRAVENOUS | Status: DC
  Administered 2016-05-28: 16:00:00 via INTRAVENOUS

## 2016-05-28 NOTE — MAU Note (Signed)
Since Christmas, has had really bad nausea, vomiting and diarrhea (once a day).  Prescribed phenergan.  Can't keep the medication or ice chips down.  zofran was called in, continues to throw up.  Now has a HA, only urinating a couple of times a day.

## 2016-05-28 NOTE — MAU Note (Signed)
Not in lobby

## 2016-05-28 NOTE — Telephone Encounter (Signed)
Pt's grandma called stating Phenergan is not working. She has had a headache x 2 weeks. Tylenol not helping. + back pain. Pt feels dehydrated and her mouth is not moist. I spoke with FCD, CNM and she advised will order Zofran. Pt was advised to try ice chips 30-40 minutes after taking Zofran. Can increase to sips of water gradually. If Zofran don't work, [facility]. Pt wants to know what to do about headache since Tylenol is not helping. Thanks!!

## 2016-05-28 NOTE — MAU Provider Note (Signed)
History     CSN: [id number]  Arrival date and time: 05/28/16 1418   First Provider Initiated Contact with Patient 05/28/16 1527      Chief Complaint  Patient presents with  . Emesis  . Diarrhea   HPI   [name] is a 31 y.o. female [phone] here in MAU with worsening N/V symptoms X 1 week. She has tried Phenergan several times in the last week,  and Zofran once today. She vomited after taking the one dose of Zofran. On average she is vomiting 6 x per day. She is able to keep down ice chips and crackers here and there.    OB History    Gravida Para Term Preterm AB Living   5 3 2 1 1 3    SAB TAB Ectopic Multiple Live Births   1       3      Past Medical History:  Diagnosis Date  . Allergy   . Anxiety   . BV (bacterial vaginosis) 12/30/2012  . Chicken pox   . Contraceptive management 07/15/2013  . Hay fever   . History of kidney stones   . Irregular bleeding 12/30/2012  . Migraines   . PONV (postoperative nausea and vomiting)   . Postcoital bleeding 05/13/2013   Had [place] and IUD in place, bleeding stopped after doxycycline but started back, has normal period lite x 2 days but bleeds every time with sex, is bright red and has clots and cramps after sex   . UTI (urinary tract infection)     Past Surgical History:  Procedure Laterality Date  . CHOLECYSTECTOMY N/A 03/09/2014   Procedure: LAPAROSCOPIC CHOLECYSTECTOMY;  Surgeon: Dalia Heading, MD;  Location: [facility];  Service: General;  Laterality: N/A;  . DILATION AND CURETTAGE OF UTERUS    . TONSILLECTOMY      Family History  Problem Relation Age of Onset  . Arthritis Mother   . Lung cancer Mother   . Hyperlipidemia Mother   . Atrial fibrillation Mother   . Stroke Mother   . Hypertension Mother   . Diabetes Father   . Dementia Father   . Breast cancer Maternal Aunt   . Diabetes Paternal Uncle     Social History  Substance Use Topics  . Smoking status: Never Smoker  . Smokeless tobacco: Never Used  .  Alcohol use No    Allergies:  Allergies  Allergen Reactions  . Bee Venom Shortness Of Breath  . Codeine Itching  . Milk-Related Compounds Nausea And Vomiting  . Penicillins Hives    Has patient had a PCN reaction causing immediate rash, facial/tongue/throat swelling, SOB or lightheadedness with hypotension: unknown Has patient had a PCN reaction causing severe rash involving mucus membranes or skin necrosis:  Has patient had a PCN reaction that required hospitalization  Has patient had a PCN reaction occurring within the last 10 years:  If all of the above answers are "NO", then may proceed with Cephalosporin use.     Prescriptions Prior to Admission  Medication Sig Dispense Refill Last Dose  . acetaminophen (TYLENOL) 500 MG tablet Take 1,000 mg by mouth every 6 (six) hours as needed.   05/28/2016 at Unknown time  . ondansetron (ZOFRAN-ODT) 4 MG disintegrating tablet Take 1 tablet (4 mg total) by mouth every 8 (eight) hours as needed for nausea or vomiting. 30 tablet 1 05/28/2016 at Unknown time  . promethazine (PHENERGAN) 25 MG tablet Take 1 tablet (25 mg total) by mouth  every 6 (six) hours as needed for nausea or vomiting. 30 tablet 1 05/27/2016 at Unknown time  . flintstones complete (FLINTSTONES) 60 MG chewable tablet Chew 1 tablet by mouth daily.   05/25/2016   Results for orders placed or performed during the hospital encounter of 05/28/16 (from the past 48 hour(s))  Urinalysis, Routine w reflex microscopic     Status: Abnormal   Collection Time: 05/28/16  2:54 PM  Result Value Ref Range   Color, Urine Miguelina (A) YELLOW    Comment: BIOCHEMICALS MAY BE AFFECTED BY COLOR   APPearance CLOUDY (A) CLEAR   Specific Gravity, Urine 1.032 (H) 1.005 - 1.030   pH 6.0 5.0 - 8.0   Glucose, UA NEGATIVE NEGATIVE mg/dL   Hgb urine dipstick NEGATIVE NEGATIVE   Bilirubin Urine SMALL (A) NEGATIVE   Ketones, ur 5 (A) NEGATIVE mg/dL   Protein, ur 161 (A) NEGATIVE mg/dL   Nitrite POSITIVE (A)  NEGATIVE   Leukocytes, UA LARGE (A) NEGATIVE   RBC / HPF 0-5 0 - 5 RBC/hpf   WBC, UA 6-30 0 - 5 WBC/hpf   Bacteria, UA MANY (A) NONE SEEN   Squamous Epithelial / LPF 6-30 (A) NONE SEEN   Mucous PRESENT   CBC with Differential     Status: None   Collection Time: 05/28/16  3:38 PM  Result Value Ref Range   WBC 6.4 4.0 - 10.5 K/uL   RBC 4.33 3.87 - 5.11 MIL/uL   Hemoglobin 12.9 12.0 - 15.0 g/dL   HCT 09.6 04.5 - 40.9 %   MCV 87.1 78.0 - 100.0 fL   MCH 29.8 26.0 - 34.0 pg   MCHC 34.2 30.0 - 36.0 g/dL   RDW 81.1 91.4 - 78.2 %   Platelets 328 150 - 400 K/uL   Neutrophils Relative % 78 %   Neutro Abs 5.0 1.7 - 7.7 K/uL   Lymphocytes Relative 17 %   Lymphs Abs 1.1 0.7 - 4.0 K/uL   Monocytes Relative 4 %   Monocytes Absolute 0.3 0.1 - 1.0 K/uL   Eosinophils Relative 1 %   Eosinophils Absolute 0.1 0.0 - 0.7 K/uL   Basophils Relative 0 %   Basophils Absolute 0.0 0.0 - 0.1 K/uL    Review of Systems  Constitutional: Positive for fever (TMAX 99.0).  Gastrointestinal: Positive for nausea and vomiting. Negative for abdominal pain.  Genitourinary: Positive for urgency. Negative for dysuria.  Musculoskeletal: Positive for back pain.  Neurological: Positive for headaches (Tension like ).   Physical Exam   Blood pressure 133/79, pulse 95, temperature 98.3 F (36.8 C), temperature source Oral, resp. rate 20, weight 253 lb (114.8 kg), last menstrual period 03/25/2016.  Physical Exam  Constitutional: She is oriented to person, place, and time. She appears well-developed and well-nourished.  Non-toxic appearance. She does not have a sickly appearance. She does not appear ill. No distress.  HENT:  Head: Normocephalic.  Eyes: Pupils are equal, round, and reactive to light.  Respiratory: Effort normal.  GI: There is no CVA tenderness.  Musculoskeletal: Normal range of motion.  Neurological: She is alert and oriented to person, place, and time.  Skin: Skin is warm. She is not diaphoretic.   Psychiatric: Her behavior is normal.    MAU Course  Procedures  None  MDM LR bolus X 1 Reglan 10 mg IV X 1 Zofran 4 mg IV X 1 Fioricet 2 tabs Patient feeling much better. HA is gone.  Urine culture pending Rocephin 2 grams to  treat UTI   Assessment and Plan   A:  1. UTI in pregnancy, antepartum, first trimester   2. Nausea and vomiting in pregnancy prior to [redacted] weeks gestation     P:  Discharge home in stable condition Rx: Fioricet, Reglan Stop phenergan. Continue Zofran as needed Return to MAU if symptoms worsen Small, frequent meals [facility] will need repeat Urine culture.   Duane LopeJennifer I Rasch, NP 05/28/2016 5:31 PM

## 2016-05-28 NOTE — Telephone Encounter (Signed)
Pt was seen at MAU. Encounter closed. [name]

## 2016-05-28 NOTE — Discharge Instructions (Signed)
Pregnancy and Urinary Tract Infection WHAT IS A URINARY TRACT INFECTION? A urinary tract infection (UTI) is an infection of any part of the urinary tract. This includes the kidneys, the tubes that connect your kidneys to your bladder (ureters), the bladder, and the tube that carries urine out of your body (urethra). These organs make, store, and get rid of urine in the body. A UTI can be a bladder infection (cystitis) or a kidney infection (pyelonephritis). This infection may be caused by fungi, viruses, and bacteria. Bacteria are the most common cause of UTIs. You are more likely to develop a UTI during pregnancy because:  The physical and hormonal changes your body goes through can make it easier for bacteria to get into your urinary tract.  Your growing baby puts pressure on your uterus and can affect urine flow. DOES A UTI PLACE MY BABY AT RISK? An untreated UTI during pregnancy could lead to a kidney infection, which can cause health problems that could affect your baby. Possible complications of an untreated UTI include:  Having your baby before 37 weeks of pregnancy (premature).  Having a baby with a low birth weight.  Developing high blood pressure during pregnancy (preeclampsia). WHAT ARE THE SYMPTOMS OF A UTI? Symptoms of a UTI include:  Fever.  Frequent urination or passing small amounts of urine frequently.  Needing to urinate urgently.  Pain or a burning sensation with urination.  Urine that smells bad or unusual.  Cloudy urine.  Pain in the lower abdomen or back.  Trouble urinating.  Blood in the urine.  Vomiting or being less hungry than normal.  Diarrhea or abdominal pain.  Vaginal discharge. WHAT ARE THE TREATMENT OPTIONS FOR A UTI DURING PREGNANCY? Treatment for this condition may include:  Antibiotic medicines that are safe to take during pregnancy.  Other medicines to treat less common causes of UTI. HOW CAN I PREVENT A UTI? To prevent a UTI:  Go  to the bathroom as soon as you feel the need.  Always wipe from front to back.  Wash your genital area with soap and warm water daily.  Empty your bladder before and after sex.  Wear cotton underwear.  Limit your intake of high sugar foods or drinks, such as regular soda, juice, and sweets..  Drink 6-8 glasses of water daily.  Do not wear tight-fitting pants.  Do not douche or use deodorant sprays.  Do not drink alcohol, caffeine, or carbonated drinks. These can irritate the bladder. WHEN SHOULD I SEEK MEDICAL CARE? Seek medical care if:  Your symptoms do not improve or get worse.  You have a fever after two days of treatment.  You have a rash.  You have abnormal vaginal discharge.  You have back or side pain.  You have chills.  You have nausea and vomiting. WHEN SHOULD I SEEK IMMEDIATE MEDICAL CARE? Seek immediate medical care if you are pregnant and:  You feel contractions in your uterus.  You have lower belly pain.  You have a gush of fluid from your vagina.  You have blood in your urine.  You are vomiting and cannot keep down any medicines or water. This information is not intended to replace advice given to you by your health care provider. Make sure you discuss any questions you have with your health care provider. Document Released: 09/07/2010 Document Revised: 10/16/2015 Document Reviewed: 04/03/2015 Elsevier Interactive Patient Education  2017 Elsevier Inc.  Nausea and Vomiting, Adult Introduction Feeling sick to your stomach (nausea) means  that your stomach is upset or you feel like you have to throw up (vomit). Feeling more and more sick to your stomach can lead to throwing up. Throwing up happens when food and liquid from your stomach are thrown up and out the mouth. Throwing up can make you feel weak and cause you to get dehydrated. Dehydration can make you tired and thirsty, make you have a dry mouth, and make it so you pee (urinate) less often.  Older adults and people with other diseases or a weak defense system (immune system) are at higher risk for dehydration. If you feel sick to your stomach or if you throw up, it is important to follow instructions from your doctor about how to take care of yourself. Follow these instructions at home: Eating and drinking Follow these instructions as told by your doctor:  Take an oral rehydration solution (ORS). This is a drink that is sold at pharmacies and stores.  Drink clear fluids in small amounts as you are able, such as:  Water.  Ice chips.  Diluted fruit juice.  Low-calorie sports drinks.  Eat bland, easy-to-digest foods in small amounts as you are able, such as:  Bananas.  Applesauce.  Rice.  Low-fat (lean) meats.  Toast.  Crackers.  Avoid fluids that have a lot of sugar or caffeine in them.  Avoid alcohol.  Avoid spicy or fatty foods. General instructions  Drink enough fluid to keep your pee (urine) clear or pale yellow.  Wash your hands often. If you cannot use soap and water, use hand sanitizer.  Make sure that all people in your home wash their hands well and often.  Take over-the-counter and prescription medicines only as told by your doctor.  Rest at home while you get better.  Watch your condition for any changes.  Breathe slowly and deeply when you feel sick to your stomach.  Keep all follow-up visits as told by your doctor. This is important. Contact a doctor if:  You have a fever.  You cannot keep fluids down.  Your symptoms get worse.  You have new symptoms.  You feel sick to your stomach for more than two days.  You feel light-headed or dizzy.  You have a headache.  You have muscle cramps. Get help right away if:  You have pain in your chest, neck, arm, or jaw.  You feel very weak or you pass out (faint).  You throw up again and again.  You see blood in your throw-up.  Your throw-up looks like black coffee  grounds.  You have bloody or black poop (stools) or poop that look like tar.  You have a very bad headache, a stiff neck, or both.  You have a rash.  You have very bad pain, cramping, or bloating in your belly (abdomen).  You have trouble breathing.  You are breathing very quickly.  Your heart is beating very quickly.  Your skin feels cold and clammy.  You feel confused.  You have pain when you pee.  You have signs of dehydration, such as:  Dark pee, hardly any pee, or no pee.  Cracked lips.  Dry mouth.  Sunken eyes.  Sleepiness.  Weakness. These symptoms may be an emergency. Do not wait to see if the symptoms will go away. Get medical help right away. Call your local emergency services (911 in the [place] Do not drive yourself to the hospital.  This information is not intended to replace advice given to you by your  health care provider. Make sure you discuss any questions you have with your health care provider. Document Released: 10/30/2007 Document Revised: 12/01/2015 Document Reviewed: 01/17/2015  2017 Elsevier

## 2016-05-29 ENCOUNTER — Telehealth: Payer: Self-pay | Admitting: *Deleted

## 2016-05-29 NOTE — Telephone Encounter (Signed)
Called patient to inform her of information from [place].Stated she has tried everything but the CoQ10 or the Magnesium.  Stated she would try those things and if they did not work she would give us a call back.

## 2016-05-29 NOTE — Telephone Encounter (Signed)
Tish, RN spoke with pt. Encounter closed. [name] 

## 2016-05-29 NOTE — Telephone Encounter (Signed)
Spoke with pt. Pt was seen at [facility] yesterday with severe UTI with nitraites and was given Reglan and Zofran. Also a Z pack. She was thinking she was supposed to take the Zofran 4 times a day and the Reglan prn. I advised the Zofran was every 8 hours prn and the Reglan was 3 times daily before meals and at bedtime. Pt voiced understanding on how to take the meds. Also, pt was given Fioricet for headaches and it's not helping. Please advise. Thanks!! [name]

## 2016-05-29 NOTE — Telephone Encounter (Signed)
Here are so HA tips.   For Headaches:   Stay well hydrated, drink enough water so that your urine is clear, sometimes if you are dehydrated you can get headaches  Eat small frequent meals and snacks, sometimes if you are hungry you can get headaches  Sometimes you get headaches during pregnancy from the pregnancy hormones  You can try tylenol (1-2 regular strength 325mg  or 1-2 extra strength 500mg ) as directed on the box. The least amount of medication that works is best.   Cool compresses (cool wet washcloth or ice pack) to area of head that is hurting  You can also try drinking a caffeinated drink to see if this will help  If not helping, try below:  For Prevention of Headaches/Migraines:  CoQ10 100mg  three times daily  Vitamin B2 400mg  daily  Magnesium Oxide 400-600mg  daily  If You Get a Bad Headache/Migraine:  Benadryl 25mg    Magnesium Oxide  1 large Gatorade  2 extra strength Tylenol (1,000mg  total)  1 cup coffee or Coke  If this doesn't help please call us @ [phone]

## 2016-05-30 LAB — CULTURE, OB URINE

## 2016-06-03 ENCOUNTER — Encounter: Payer: Self-pay | Admitting: Adult Health

## 2016-06-12 ENCOUNTER — Encounter: Payer: BLUE CROSS/BLUE SHIELD | Admitting: Advanced Practice Midwife

## 2016-06-14 ENCOUNTER — Telehealth: Payer: Self-pay | Admitting: *Deleted

## 2016-06-14 ENCOUNTER — Inpatient Hospital Stay (HOSPITAL_COMMUNITY)
Admission: AD | Admit: 2016-06-14 | Discharge: 2016-06-15 | Disposition: A | Discharge status: Home / Self Care | Payer: Medicaid Other | Source: Ambulatory Visit | Attending: Obstetrics and Gynecology | Admitting: Obstetrics and Gynecology

## 2016-06-14 ENCOUNTER — Encounter (HOSPITAL_COMMUNITY): Payer: Self-pay

## 2016-06-14 DIAGNOSIS — N12 Tubulo-interstitial nephritis, not specified as acute or chronic: Secondary | ICD-10-CM | POA: Diagnosis present

## 2016-06-14 DIAGNOSIS — M549 Dorsalgia, unspecified: Secondary | ICD-10-CM | POA: Diagnosis present

## 2016-06-14 DIAGNOSIS — N189 Chronic kidney disease, unspecified: Secondary | ICD-10-CM | POA: Diagnosis not present

## 2016-06-14 DIAGNOSIS — Z3A11 11 weeks gestation of pregnancy: Secondary | ICD-10-CM | POA: Diagnosis not present

## 2016-06-14 DIAGNOSIS — O26831 Pregnancy related renal disease, first trimester: Secondary | ICD-10-CM | POA: Insufficient documentation

## 2016-06-14 DIAGNOSIS — O2301 Infections of kidney in pregnancy, first trimester: Secondary | ICD-10-CM | POA: Diagnosis not present

## 2016-06-14 HISTORY — DX: Other specified noninflammatory disorders of vagina: N89.8

## 2016-06-14 HISTORY — DX: Urinary tract infection, site not specified: N39.0

## 2016-06-14 HISTORY — DX: Chronic kidney disease, unspecified: N18.9

## 2016-06-14 LAB — URINALYSIS, ROUTINE W REFLEX MICROSCOPIC
Bilirubin Urine: NEGATIVE
Bilirubin Urine: NEGATIVE
GLUCOSE, UA: NEGATIVE mg/dL
Glucose, UA: NEGATIVE mg/dL
Hgb urine dipstick: NEGATIVE
Hgb urine dipstick: NEGATIVE
Ketones, ur: 20 mg/dL — AB
Ketones, ur: 20 mg/dL — AB
Nitrite: NEGATIVE
Nitrite: NEGATIVE
Protein, ur: 30 mg/dL — AB
Protein, ur: 30 mg/dL — AB
SPECIFIC GRAVITY, URINE: 1.032 — AB (ref 1.005–1.030)
Specific Gravity, Urine: 1.029 (ref 1.005–1.030)
pH: 5 (ref 5.0–8.0)
pH: 5 (ref 5.0–8.0)

## 2016-06-14 LAB — CBC WITH DIFFERENTIAL/PLATELET
Basophils Absolute: 0 10*3/uL (ref 0.0–0.1)
Basophils Relative: 0 %
EOS ABS: 0.1 10*3/uL (ref 0.0–0.7)
EOS PCT: 1 %
HCT: 39.6 % (ref 36.0–46.0)
Hemoglobin: 13.8 g/dL (ref 12.0–15.0)
LYMPHS ABS: 1.7 10*3/uL (ref 0.7–4.0)
Lymphocytes Relative: 20 %
MCH: 30.2 pg (ref 26.0–34.0)
MCHC: 34.8 g/dL (ref 30.0–36.0)
MCV: 86.7 fL (ref 78.0–100.0)
MONO ABS: 0.4 10*3/uL (ref 0.1–1.0)
MONOS PCT: 5 %
Neutro Abs: 6.1 10*3/uL (ref 1.7–7.7)
Neutrophils Relative %: 74 %
Platelets: 306 10*3/uL (ref 150–400)
RBC: 4.57 MIL/uL (ref 3.87–5.11)
RDW: 12.7 % (ref 11.5–15.5)
WBC: 8.4 10*3/uL (ref 4.0–10.5)

## 2016-06-14 MED ORDER — ACETAMINOPHEN 325 MG PO TABS: 650.0000 mg | ORAL_TABLET | ORAL | Status: DC | PRN

## 2016-06-14 MED ORDER — DEXTROSE 5 % IV SOLN
1.0000 g | INTRAVENOUS | Status: DC
  Administered 2016-06-15: 1 g via INTRAVENOUS
  Filled 2016-06-14: qty 10

## 2016-06-14 MED ORDER — DOCUSATE SODIUM 100 MG PO CAPS
100.0000 mg | ORAL_CAPSULE | Freq: Every day | ORAL | Status: DC
  Filled 2016-06-14: qty 1

## 2016-06-14 MED ORDER — CALCIUM CARBONATE ANTACID 500 MG PO CHEW: 2.0000 | CHEWABLE_TABLET | ORAL | Status: DC | PRN

## 2016-06-14 MED ORDER — PRENATAL MULTIVITAMIN CH
1.0000 | ORAL_TABLET | Freq: Every day | ORAL | Status: DC
  Administered 2016-06-15: 1 via ORAL
  Filled 2016-06-14 (×2): qty 1

## 2016-06-14 MED ORDER — SODIUM CHLORIDE 0.9 % IV SOLN
INTRAVENOUS | Status: DC
  Administered 2016-06-15: 01:00:00 via INTRAVENOUS

## 2016-06-14 MED ORDER — PROMETHAZINE HCL 25 MG/ML IJ SOLN
12.5000 mg | Freq: Four times a day (QID) | INTRAMUSCULAR | Status: DC | PRN
  Administered 2016-06-14: 25 mg via INTRAVENOUS
  Administered 2016-06-15: 12.5 mg via INTRAVENOUS
  Filled 2016-06-14 (×2): qty 1

## 2016-06-14 MED ORDER — SODIUM CHLORIDE 0.9 % IV SOLN
Freq: Once | INTRAVENOUS | Status: AC
  Administered 2016-06-14: 23:00:00 via INTRAVENOUS

## 2016-06-14 MED ORDER — ZOLPIDEM TARTRATE 5 MG PO TABS: 5.0000 mg | ORAL_TABLET | Freq: Every evening | ORAL | Status: DC | PRN

## 2016-06-14 NOTE — [facility]U Note (Addendum)
Was seen here 2 wks ago with severe nausea. Urine showed nitrates and given antibiotics. Past wkend started having R flank pain. On Weds had yeast infection and pelvic pain. My office was closed WEds and THurs. Called office today and told to come in and be checked for pyelonephritis. Hx kidney stones. On second day of Monistat 7

## 2016-06-14 NOTE — Telephone Encounter (Signed)
Pt informed to go to [facility]U for evaluation and treatment if needed, need to R/O Pyelo.  Pt verbalized understanding.

## 2016-06-14 NOTE — Telephone Encounter (Signed)
Pt states 2 weeks ago was treated for a UTI by the ED, was not having any symptoms at that time.  Pt states now starting this past Saturday she is having terrible LBP and urine has a very strong, bad odor.  She states kept a UTI during last pregnancy and Keflex never helped it but always caused a yeast infection.  Pt also states she now has a yeast infection but is taking Monistat 7 OTC and it seems to be getting some better.  Wants to know if we will send in RX for [facility] to [facility].

## 2016-06-14 NOTE — MAU Provider Note (Signed)
History     CSN: [id number]  Arrival date and time: 06/14/16 2109   First Provider Initiated Contact with Patient 06/14/16 2240      Chief Complaint  Patient presents with  . Pyelonephritis   HPI   [name] is a 31 y.o. female [phone] @ [redacted]w[redacted]d here in MAU with back pain, urine is dark and has strong odor, and fever.   Fever at home; Tmax 99.9. She had frequent UTI's with her last pregnancy.   She was seen back on 1/2 for a UTI; she was treated with Rocephin. The urine culture came back sensitive to cefazolin. The culture showed >100,000 e-coli. She states her symptoms improved for 5 days only and then returned.   She denies dysuria.  She took tylenol at 7 pm.  + nausea, no vomiting.  Currently rates her flank pain 7/10  OB History    Gravida Para Term Preterm AB Living   5 3 2 1 1 3    SAB TAB Ectopic Multiple Live Births   1       3      Past Medical History:  Diagnosis Date  . Allergy   . Anxiety   . BV (bacterial vaginosis) 12/30/2012  . Chicken pox   . Chronic kidney disease   . Contraceptive management 07/15/2013  . Hay fever   . History of kidney stones   . Irregular bleeding 12/30/2012  . Migraines   . PONV (postoperative nausea and vomiting)   . Postcoital bleeding 05/13/2013   Had [place] and IUD in place, bleeding stopped after doxycycline but started back, has normal period lite x 2 days but bleeds every time with sex, is bright red and has clots and cramps after sex   . UTI (urinary tract infection)     Past Surgical History:  Procedure Laterality Date  . CHOLECYSTECTOMY N/A 03/09/2014   Procedure: LAPAROSCOPIC CHOLECYSTECTOMY;  Surgeon: Dalia Heading, MD;  Location: [facility];  Service: General;  Laterality: N/A;  . DILATION AND CURETTAGE OF UTERUS    . TONSILLECTOMY      Family History  Problem Relation Age of Onset  . Arthritis Mother   . Lung cancer Mother   . Hyperlipidemia Mother   . Atrial fibrillation Mother   . Stroke Mother   .  Hypertension Mother   . Diabetes Father   . Dementia Father   . Breast cancer Maternal Aunt   . Diabetes Paternal Uncle     Social History  Substance Use Topics  . Smoking status: Never Smoker  . Smokeless tobacco: Never Used  . Alcohol use No    Allergies:  Allergies  Allergen Reactions  . Bee Venom Shortness Of Breath  . Codeine Itching  . Milk-Related Compounds Nausea And Vomiting  . Penicillins Hives    Has patient had a PCN reaction causing immediate rash, facial/tongue/throat swelling, SOB or lightheadedness with hypotension: unknown Has patient had a PCN reaction causing severe rash involving mucus membranes or skin necrosis:  Has patient had a PCN reaction that required hospitalization  Has patient had a PCN reaction occurring within the last 10 years:  If all of the above answers are "NO", then may proceed with Cephalosporin use.     Prescriptions Prior to Admission  Medication Sig Dispense Refill Last Dose  . acetaminophen (TYLENOL) 500 MG tablet Take 1,000 mg by mouth every 6 (six) hours as needed for mild pain or headache.    06/14/2016 at Unknown time  .  butalbital-acetaminophen-caffeine (FIORICET, ESGIC) 50-325-40 MG tablet Take 1-2 tablets by mouth every 6 (six) hours as needed for headache. 20 tablet 0 Past Week at Unknown time  . flintstones complete (FLINTSTONES) 60 MG chewable tablet Chew 1 tablet by mouth daily.   06/14/2016 at Unknown time  . metoCLOPramide (REGLAN) 10 MG tablet Take 1 tablet (10 mg total) by mouth 4 (four) times daily -  before meals and at bedtime. (Patient taking differently: Take 10 mg by mouth 3 (three) times daily as needed for nausea. ) 60 tablet 1 06/14/2016 at Unknown time  . Miconazole Nitrate (MONISTAT 7 VA) Place 1 Applicatorful vaginally at bedtime.   06/13/2016 at Unknown time  . ondansetron (ZOFRAN-ODT) 4 MG disintegrating tablet Take 1 tablet (4 mg total) by mouth every 8 (eight) hours as needed for nausea or vomiting. 30 tablet 1  Past Week at Unknown time   Results for orders placed or performed during the hospital encounter of 06/14/16 (from the past 48 hour(s))  Urinalysis, Routine w reflex microscopic     Status: Abnormal   Collection Time: 06/14/16  9:30 PM  Result Value Ref Range   Color, Urine Madd[place]ena (A) YELLOW    Comment: BIOCHEMICALS MAY BE AFFECTED BY COLOR   APPearance CLOUDY (A) CLEAR   Specific Gravity, Urine 1.029 1.005 - 1.030   pH 5.0 5.0 - 8.0   Glucose, UA NEGATIVE NEGATIVE mg/dL   Hgb urine dipstick NEGATIVE NEGATIVE   Bilirubin Urine NEGATIVE NEGATIVE   Ketones, ur 20 (A) NEGATIVE mg/dL   Protein, ur 30 (A) NEGATIVE mg/dL   Nitrite NEGATIVE NEGATIVE   Leukocytes, UA MODERATE (A) NEGATIVE   RBC / HPF 0-5 0 - 5 RBC/hpf   WBC, UA 6-30 0 - 5 WBC/hpf   Bacteria, UA MANY (A) NONE SEEN   Squamous Epithelial / LPF TOO NUMEROUS TO COUNT (A) NONE SEEN   Mucous PRESENT   CBC with Differential     Status: None   Collection Time: 06/14/16 10:21 PM  Result Value Ref Range   WBC 8.4 4.0 - 10.5 K/uL   RBC 4.57 3.87 - 5.11 MIL/uL   Hemoglobin 13.8 12.0 - 15.0 g/dL   HCT 16.1 09.6 - 04.5 %   MCV 86.7 78.0 - 100.0 fL   MCH 30.2 26.0 - 34.0 pg   MCHC 34.8 30.0 - 36.0 g/dL   RDW 40.9 81.1 - 91.4 %   Platelets 306 150 - 400 K/uL   Neutrophils Relative % 74 %   Neutro Abs 6.1 1.7 - 7.7 K/uL   Lymphocytes Relative 20 %   Lymphs Abs 1.7 0.7 - 4.0 K/uL   Monocytes Relative 5 %   Monocytes Absolute 0.4 0.1 - 1.0 K/uL   Eosinophils Relative 1 %   Eosinophils Absolute 0.1 0.0 - 0.7 K/uL   Basophils Relative 0 %   Basophils Absolute 0.0 0.0 - 0.1 K/uL    Review of Systems  Constitutional: Positive for chills and fever.  Gastrointestinal: Positive for nausea. Negative for vomiting.  Genitourinary: Positive for flank pain (Right sided flank pain ). Negative for difficulty urinating, dysuria, frequency and urgency.   Physical Exam   Blood pressure 135/87, pulse 94, temperature 99.4 F (37.4 C), resp.  rate 18, height 5\' 4"  (1.626 m), weight 249 lb 6.4 oz (113.1 kg), last menstrual period 03/25/2016.  Physical Exam  Constitutional: She is oriented to person, place, and time. She appears well-developed and well-nourished. No distress.  HENT:  Head: Normocephalic.  Respiratory: Effort normal.  GI: There is CVA tenderness (Right sided. ).  Musculoskeletal: Normal range of motion.  Neurological: She is alert and oriented to person, place, and time.  Skin: Skin is warm. She is not diaphoretic.  Psychiatric: Her behavior is normal.    MAU Course  Procedures  None  MDM + fetal heart tones.  Discussed patient with Dr. Vergie LivingPickens who would like to admit for IV antibiotics  Urine recollect due to Squamous cells NS bolus  Phenergan 25 mg IV- patient says IV antibiotics make her vomit.   Assessment and Plan   A:  1. Pyelonephritis complicating pregnancy in first trimester     P:  Admit  Urine culture pending NS @ 100 ml/hr maintenance Rocephin 1 gram IV Q day  Phenergan as needed for N/V CBC Q day   Duane LopeJennifer I Rasch, NP 06/14/2016 11:10 PM

## 2016-06-15 ENCOUNTER — Encounter (HOSPITAL_COMMUNITY): Payer: Self-pay | Admitting: Obstetrics and Gynecology

## 2016-06-15 ENCOUNTER — Encounter: Payer: Self-pay | Admitting: Obstetrics and Gynecology

## 2016-06-15 DIAGNOSIS — Z6841 Body Mass Index (BMI) 40.0 and over, adult: Secondary | ICD-10-CM | POA: Insufficient documentation

## 2016-06-15 DIAGNOSIS — O2301 Infections of kidney in pregnancy, first trimester: Secondary | ICD-10-CM | POA: Diagnosis not present

## 2016-06-15 DIAGNOSIS — N12 Tubulo-interstitial nephritis, not specified as acute or chronic: Secondary | ICD-10-CM | POA: Diagnosis present

## 2016-06-15 LAB — TYPE AND SCREEN
ABO/RH(D): O POS
Antibody Screen: NEGATIVE

## 2016-06-15 LAB — CBC WITH DIFFERENTIAL/PLATELET
Basophils Absolute: 0 10*3/uL (ref 0.0–0.1)
Basophils Relative: 0 %
EOS PCT: 3 %
Eosinophils Absolute: 0.1 10*3/uL (ref 0.0–0.7)
HCT: 34.6 % — ABNORMAL LOW (ref 36.0–46.0)
Hemoglobin: 12 g/dL (ref 12.0–15.0)
LYMPHS ABS: 1.4 10*3/uL (ref 0.7–4.0)
Lymphocytes Relative: 25 %
MCH: 30.2 pg (ref 26.0–34.0)
MCHC: 34.7 g/dL (ref 30.0–36.0)
MCV: 86.9 fL (ref 78.0–100.0)
MONO ABS: 0.4 10*3/uL (ref 0.1–1.0)
Monocytes Relative: 8 %
Neutro Abs: 3.7 10*3/uL (ref 1.7–7.7)
Neutrophils Relative %: 64 %
PLATELETS: 269 10*3/uL (ref 150–400)
RBC: 3.98 MIL/uL (ref 3.87–5.11)
RDW: 12.7 % (ref 11.5–15.5)
WBC: 5.7 10*3/uL (ref 4.0–10.5)

## 2016-06-15 MED ORDER — CLOTRIMAZOLE 1 % EX CREA
TOPICAL_CREAM | Freq: Every day | CUTANEOUS | Status: DC | PRN
  Filled 2016-06-15: qty 15

## 2016-06-15 MED ORDER — MICONAZOLE NITRATE 2 % EX CREA: TOPICAL_CREAM | Freq: Every day | CUTANEOUS | Status: DC | PRN

## 2016-06-15 MED ORDER — CEPHALEXIN 500 MG PO CAPS
500.0000 mg | ORAL_CAPSULE | Freq: Four times a day (QID) | ORAL | Status: DC
  Administered 2016-06-15: 500 mg via ORAL
  Filled 2016-06-15 (×4): qty 1

## 2016-06-15 MED ORDER — DEXTROSE 5 % IV SOLN
1.0000 g | Freq: Once | INTRAVENOUS | Status: AC
  Administered 2016-06-15: 1 g via INTRAVENOUS
  Filled 2016-06-15: qty 10

## 2016-06-15 MED ORDER — CEPHALEXIN 500 MG PO CAPS: 500.0000 mg | ORAL_CAPSULE | Freq: Four times a day (QID) | ORAL | 0 refills | Status: DC

## 2016-06-15 MED ORDER — DEXTROSE 5 % IV SOLN: 2.0000 g | INTRAVENOUS | Status: DC

## 2016-06-15 MED ORDER — DOCUSATE SODIUM 100 MG PO CAPS: 100.0000 mg | ORAL_CAPSULE | Freq: Every day | ORAL | Status: DC | PRN

## 2016-06-15 MED ORDER — PROMETHAZINE HCL 25 MG PO TABS
25.0000 mg | ORAL_TABLET | Freq: Four times a day (QID) | ORAL | Status: DC | PRN
  Administered 2016-06-15: 25 mg via ORAL
  Filled 2016-06-15: qty 1

## 2016-06-15 NOTE — Discharge Summary (Signed)
Physician Discharge Summary  Patient ID: ETHAL GOTAY MRN: [id number] DOB/AGE: [birth year] 31 y.o.  Admit date: 06/14/2016 Discharge date: 06/15/2016  Admission Diagnoses: Pyelonephritis complicating pregnancy in first trimester       Discharge Diagnoses:  Pyelonephritis complicating pregnancy in first trimester     Active Problems:   History of preterm delivery, currently pregnant in first trimester   Pyelonephritis affecting pregnancy in first trimester   Discharged Condition: good  Hospital Course: *Ms.Lajune MERLINDA WRUBEL is a 31 y.o. female [phone] @ [redacted]w[redacted]d here in MAU with back pain, urine is dark and has strong odor, and fever.   Fever at home; Tmax 99.9. She had frequent UTI's with her last pregnancy.   She was seen back on 1/2 for a UTI; she was treated with Rocephin. The urine culture came back sensitive to cefazolin. The culture showed >100,000 e-coli. She states her symptoms improved for 5 days only and then returned.   She denies dysuria.  She took tylenol at 7 pm.  + nausea, no vomiting.  Currently rates her flank pain 7/10  After 24 hours she rates pain a 3, is afebrile x 24 hours and requests outpt care. IV  Consults: None  Significant Diagnostic Studies: labs:  CBC    Component Value Date/Time   WBC 5.7 06/15/2016 0522   RBC 3.98 06/15/2016 0522   HGB 12.0 06/15/2016 0522   HCT 34.6 (L) 06/15/2016 0522   HCT 39.3 05/15/2016 1629   PLT 269 06/15/2016 0522   PLT 316 05/15/2016 1629   MCV 86.9 06/15/2016 0522   MCV 88 05/15/2016 1629   MCH 30.2 06/15/2016 0522   MCHC 34.7 06/15/2016 0522   RDW 12.7 06/15/2016 0522   RDW 13.0 05/15/2016 1629   LYMPHSABS 1.4 06/15/2016 0522   MONOABS 0.4 06/15/2016 0522   EOSABS 0.1 06/15/2016 0522   BASOSABS 0.0 06/15/2016 0522     Treatments: antibiotics: ceftriaxone  Discharge Exam: Blood pressure 93/65, pulse 90, temperature 98 F (36.7 C), resp. rate 17, height 5\' 4"  (1.626 m), weight 113.1 kg (249 lb  6.4 oz), last menstrual period 03/25/2016, SpO2 98 %. General appearance: alert, cooperative, no distress and rates pain a 3 on right cva Head: Normocephalic, without obvious abnormality, atraumatic Back: symmetric, no curvature. ROM normal. No CVA tenderness. Resp: clear to auscultation bilaterally  Disposition: 01-Home or Self Care  Discharge Instructions    Call MD for:  severe uncontrolled pain    Complete by:  As directed    Call MD for:  temperature >100.4    Complete by:  As directed    Diet - low sodium heart healthy    Complete by:  As directed    Increase activity slowly    Complete by:  As directed      Allergies as of 06/15/2016      Reactions   Bee Venom Shortness Of Breath   Codeine Itching   Penicillins Hives   Has patient had a PCN reaction causing immediate rash, facial/tongue/throat swelling, SOB or lightheadedness with hypotension: unknown Has patient had a PCN reaction causing severe rash involving mucus membranes or skin necrosis:  Has patient had a PCN reaction that required hospitalization  Has patient had a PCN reaction occurring within the last 10 years:  If all of the above answers are "NO", then may proceed with Cephalosporin use.      Medication List    TAKE these medications   acetaminophen 500 MG tablet Commonly known as:  TYLENOL Take 1,000 mg by mouth every 6 (six) hours as needed for mild pain or headache.   butalbital-acetaminophen-caffeine 50-325-40 MG tablet Commonly known as:  FIORICET, ESGIC Take 1-2 tablets by mouth every 6 (six) hours as needed for headache.   cephALEXin 500 MG capsule Commonly known as:  KEFLEX Take 1 capsule (500 mg total) by mouth every 6 (six) hours. Start taking on:  06/16/2016   flintstones complete 60 MG chewable tablet Chew 1 tablet by mouth daily.   metoCLOPramide 10 MG tablet Commonly known as:  REGLAN Take 1 tablet (10 mg total) by mouth 4 (four) times daily -  before meals and at bedtime. What  changed:  when to take this  reasons to take this   MONISTAT 7 VA Place 1 Applicatorful vaginally at bedtime.   ondansetron 4 MG disintegrating tablet Commonly known as:  ZOFRAN-ODT Take 1 tablet (4 mg total) by mouth every 8 (eight) hours as needed for nausea or vomiting.      Follow-up Information    [name],Bedelia Pong V, MD Follow up in 3 day(s).   Specialties:  Obstetrics and Gynecology, Radiology Contact information: [address] [place] [address] [phone]           Signed: [name],[place] Mcphee V 06/15/2016, 10:23 PM

## 2016-06-15 NOTE — Progress Notes (Signed)
Daily Antepartum Note  Admission Date: 06/14/2016 Current Date: 06/15/2016 7:46 AM  Audrey [name] Audrey [name] is a 31 y.o. [id number] @ 4888w5d, HD#2, admitted for right sided pyelo.  Pregnancy complicated by: BMI 43, h/o PTB Overnight/24hr events:  none  Subjective:  No fevers, chest pain, SOB, SAB s/s. Still having some morning sickness s/s but able to take PO fine.   Objective:    Current Vital Signs 24h Vital Sign Ranges  T 98.9 F (37.2 C) Temp  Avg: 99.1 F (37.3 C)  Min: 98.9 F (37.2 C)  Max: 99.4 F (37.4 C)  BP 120/74 BP  Min: 118/85  Max: 135/87  HR 96 Pulse  Avg: 97  Min: 94  Max: 101  RR 18 Resp  Avg: 17.3  Min: 16  Max: 18  SaO2 96 % Not Delivered SpO2  Avg: 96 %  Min: 96 %  Max: 96 %       24 Hour I/O Current Shift I/O  Time Ins Outs No intake/output data recorded. No intake/output data recorded.   Patient Vitals for the past 24 hrs:  BP Temp Temp src Pulse Resp SpO2 Height Weight  06/15/16 0003 120/74 98.9 F (37.2 C) Oral 96 18 96 % - -  06/14/16 2323 - 99 F (37.2 C) Oral - 16 - - -  06/14/16 2248 118/85 - - 101 - - - -  06/14/16 2121 135/87 99.4 F (37.4 C) - 94 18 - 5\' 4"  (1.626 m) 113.1 kg (249 lb 6.4 oz)    Physical exam: General: Well nourished, well developed female in no acute distress. Abdomen: obese, nttp Back: ?slight right CVAT Cardiovascular: S1, S2 normal, no murmur, rub or gallop, regular rate and rhythm Respiratory: CTAB Extremities: no clubbing, cyanosis or edema Skin: Warm and dry.   Medications: Current Facility-Administered Medications  Medication Dose Route Frequency Provider Last Rate Last Dose  . 0.9 %  sodium chloride infusion   Intravenous Continuous Duane LopeJennifer I Rasch, NP 100 mL/hr at 06/15/16 0105    . acetaminophen (TYLENOL) tablet 650 mg  650 mg Oral Q4H PRN Duane LopeJennifer I Rasch, NP      . calcium carbonate (TUMS - dosed in mg elemental calcium) chewable tablet 400 mg of elemental calcium  2 tablet Oral Q4H PRN Duane LopeJennifer I Rasch, NP       . cefTRIAXone (ROCEPHIN) 1 g in dextrose 5 % 50 mL IVPB  1 g Intravenous Q24H Duane LopeJennifer I Rasch, NP   1 g at 06/15/16 0055  . docusate sodium (COLACE) capsule 100 mg  100 mg Oral Daily Harolyn RutherfordJennifer I Rasch, NP      . prenatal multivitamin tablet 1 tablet  1 tablet Oral Q1200 Jennifer I Rasch, NP      . promethazine (PHENERGAN) injection 12.5-25 mg  12.5-25 mg Intravenous Q6H PRN Duane LopeJennifer I Rasch, NP   12.5 mg at 06/15/16 0607  . zolpidem (AMBIEN) tablet 5 mg  5 mg Oral QHS PRN Duane LopeJennifer I Rasch, NP        Labs:   Recent Labs Lab 06/14/16 2221 06/15/16 0522  WBC 8.4 5.7  HGB 13.8 12.0  HCT 39.6 34.6*  PLT 306 269    UCx pending:   Radiology: no imaging  Assessment & Plan:  Patient doing well *Pregnancy: PNV, qday FHTs *Renal: continue with rocephin D#2. Follow up UCx. Patient likely incompletely treated with 1/2 +UCx (just got one dose of rocephin in the MAU) so may be able to go after 24hrs of  IV abx if UCx sx aren't back yet; prior UCx was >100k e.coli and pan sensitive. D/w pt that will be on low dose abx for remainder of pregnancy *Preterm: no issues *PPx: SCDs, OOB encouraged. *FEN/GI: MIVF, regular diet, PRN anti emetics *Dispo: likely tomorrow or Monday.   Cornelia Copa MD Attending Center for [facility])

## 2016-06-17 ENCOUNTER — Encounter: Payer: Self-pay | Admitting: Women's Health

## 2016-06-17 ENCOUNTER — Ambulatory Visit (INDEPENDENT_AMBULATORY_CARE_PROVIDER_SITE_OTHER): Payer: Medicaid Other | Admitting: Women's Health

## 2016-06-17 VITALS — BP 112/78 | HR 96 | Wt 250.0 lb

## 2016-06-17 DIAGNOSIS — Z3481 Encounter for supervision of other normal pregnancy, first trimester: Secondary | ICD-10-CM

## 2016-06-17 DIAGNOSIS — Z1389 Encounter for screening for other disorder: Secondary | ICD-10-CM | POA: Diagnosis not present

## 2016-06-17 DIAGNOSIS — O3441 Maternal care for other abnormalities of cervix, first trimester: Secondary | ICD-10-CM | POA: Diagnosis not present

## 2016-06-17 DIAGNOSIS — Z9889 Other specified postprocedural states: Secondary | ICD-10-CM

## 2016-06-17 DIAGNOSIS — O2301 Infections of kidney in pregnancy, first trimester: Secondary | ICD-10-CM

## 2016-06-17 DIAGNOSIS — O3440 Maternal care for other abnormalities of cervix, unspecified trimester: Secondary | ICD-10-CM | POA: Insufficient documentation

## 2016-06-17 DIAGNOSIS — O09219 Supervision of pregnancy with history of pre-term labor, unspecified trimester: Secondary | ICD-10-CM

## 2016-06-17 DIAGNOSIS — Z331 Pregnant state, incidental: Secondary | ICD-10-CM | POA: Diagnosis not present

## 2016-06-17 DIAGNOSIS — Z3A11 11 weeks gestation of pregnancy: Secondary | ICD-10-CM

## 2016-06-17 DIAGNOSIS — O09899 Supervision of other high risk pregnancies, unspecified trimester: Secondary | ICD-10-CM

## 2016-06-17 DIAGNOSIS — O09211 Supervision of pregnancy with history of pre-term labor, first trimester: Secondary | ICD-10-CM | POA: Diagnosis not present

## 2016-06-17 LAB — POCT URINALYSIS DIPSTICK
Blood, UA: NEGATIVE
GLUCOSE UA: NEGATIVE
Ketones, UA: NEGATIVE
Leukocytes, UA: NEGATIVE
Nitrite, UA: NEGATIVE
Protein, UA: NEGATIVE

## 2016-06-17 MED ORDER — CEPHALEXIN 500 MG PO CAPS: 500.0000 mg | ORAL_CAPSULE | Freq: Every day | ORAL | 6 refills | Status: DC

## 2016-06-17 NOTE — Progress Notes (Addendum)
Subjective:  Audrey [name] is a 31 y.o. [phone] Caucasian female at 7676w2d by 6wk u/s, being seen today for her first obstetrical visit.  Had intake visit with Tish, RN 05/15/16. Her obstetrical history is significant for sab x 1, term uncomplicated svb x 2, then spontaneous labor and PTB @ 30wks- pt attributes to stressful relationship she was in at the time. This pregnancy complicated by UTI treated in ED, then admission for pyelo 1/19 and just released 1/20, still taking keflex qid. Had recurrent uti's last pregnancy. History of cryotherapy d/t cervicitis from cervical eversion.  Pregnancy history fully reviewed.  Patient reports some nausea, not bad, has meds at home previously rx'd. Denies vb, cramping, uti s/s, abnormal/malodorous vag d/c, or vulvovaginal itching/irritation.  BP 112/78   Pulse 96   Wt 250 lb (113.4 kg)   LMP 03/25/2016 (Exact Date)   BMI 42.91 kg/m   HISTORY: OB History  Gravida Para Term Preterm AB Living  5 3 2 1 1 3   SAB TAB Ectopic Multiple Live Births  1       3    # Outcome Date GA Lbr Len/2nd Weight Sex Delivery Anes PTL Lv  5 Current           4 Preterm 08/07/08 2431w0d  4 lb (1.814 kg) M Vag-Spont EPI Y LIV  3 Term 10/16/06 6750w0d  7 lb 9 oz (3.43 kg) F Vag-Spont EPI N LIV  2 Term 03/25/05 573w0d  6 lb 14 oz (3.118 kg) F Vag-Spont EPI N LIV  1 SAB 2005             Past Medical History:  Diagnosis Date  . Allergy   . Anxiety   . BV (bacterial vaginosis) 12/30/2012  . Chicken pox   . Chronic kidney disease   . Contraceptive management 07/15/2013  . Hay fever   . History of kidney stones   . Irregular bleeding 12/30/2012  . Leukorrhea 10/07/2013   resolved s/p cryotherapy    . Migraines   . PONV (postoperative nausea and vomiting)   . Postcoital bleeding 05/13/2013   Had US and IUD in place, bleeding stopped after doxycycline but started back, has normal period lite x 2 days but bleeds every time with sex, is bright red and has clots and cramps after  sex   . UTI (lower urinary tract infection) 08/24/2014  . UTI (urinary tract infection)    Past Surgical History:  Procedure Laterality Date  . CHOLECYSTECTOMY N/A 03/09/2014   Procedure: LAPAROSCOPIC CHOLECYSTECTOMY;  Surgeon: Dalia HeadingMark A Jenkins, MD;  Location: [facility];  Service: General;  Laterality: N/A;  . DILATION AND CURETTAGE OF UTERUS    . TONSILLECTOMY     Family History  Problem Relation Age of Onset  . Arthritis Mother   . Lung cancer Mother   . Hyperlipidemia Mother   . Atrial fibrillation Mother   . Stroke Mother   . Hypertension Mother   . Diabetes Father   . Dementia Father   . Breast cancer Maternal Aunt   . Diabetes Paternal Uncle     Exam   System:     General: Well developed & nourished, no acute distress   Skin: Warm & dry, normal coloration and turgor, no rashes   Neurologic: Alert & oriented, normal mood   Cardiovascular: Regular rate & rhythm   Respiratory: Effort & rate normal, LCTAB, acyanotic   Abdomen: Soft, non tender   Extremities: normal strength, tone  Thin prep  pap smear neg March 2017  FHR: 168 via doppler   Assessment:   Pregnancy: [id number] Patient Active Problem List   Diagnosis Date Noted  . History of preterm delivery, currently pregnant 06/17/2016  . History of cryosurgery of cervix affecting pregnancy 06/17/2016  . BMI 40.0-44.9, adult (HCC) 06/15/2016  . Pyelonephritis affecting pregnancy in first trimester 06/15/2016  . Encounter for supervision of other normal pregnancy 05/15/2016  . Attention deficit hyperactivity disorder (ADHD) 04/24/2015  . BV (bacterial vaginosis) 12/30/2012  . Anxiety 06/08/2012  . Migraines 09/25/2010    103w2d [id number] New OB visit Recent admission for pyelo, 2nd UTI this pregnancy H/O recurrent UTIs last pregnancy H/O 30wk PTB d/t spontaneous labor H/O cryotherapy   Plan:  Initial labs obtained last visit, except gc/ct which was ordered today Continue prenatal vitamins Problem list reviewed and  updated Reviewed n/v relief measures and warning s/s to report Reviewed recommended weight gain based on pre-gravid BMI Encouraged well-balanced diet Genetic Screening discussed Integrated Screen: requested Cystic fibrosis screening discussed declined Ultrasound discussed; fetal survey: requested Follow up in asap for 1st it/nt (no visit), then 4 weeks for 2nd IT and visit [facility] completed at intake visit Rx keflex q hs for suppression to begin after finished w/ QID x 7d tx, will get urine POC next visit Discussed Makena, wants-has applied for preg [facility], waiting to get card- to bring card as soon as she gets it so we can get Makena ordered  Marge Duncans CNM, [facility] 06/17/2016 4:45 PM

## 2016-06-17 NOTE — Patient Instructions (Addendum)
Bring  Nausea & Vomiting  Have saltine crackers or pretzels by your bed and eat a few bites before you raise your head out of bed in the morning  Eat small frequent meals throughout the day instead of large meals  Drink plenty of fluids throughout the day to stay hydrated, just don't drink a lot of fluids with your meals.  This can make your stomach fill up faster making you feel sick  Do not brush your teeth right after you eat  Products with real ginger are good for nausea, like ginger ale and ginger hard candy Make sure it says made with real ginger!  Sucking on sour candy like lemon heads is also good for nausea  If your prenatal vitamins make you nauseated, take them at night so you will sleep through the nausea  Sea Bands  If you feel like you need medicine for the nausea & vomiting please let us know  If you are unable to keep any fluids or food down please let us know   Constipation  Drink plenty of fluid, preferably water, throughout the day  Eat foods high in fiber such as fruits, vegetables, and grains  Exercise, such as walking, is a good way to keep your bowels regular  Drink warm fluids, especially warm prune juice, or decaf coffee  Eat a 1/2 cup of real oatmeal (not instant), 1/2 cup applesauce, and 1/2-1 cup warm prune juice every day  If needed, you may take Colace (docusate sodium) stool softener once or twice a day to help keep the stool soft. If you are pregnant, wait until you are out of your first trimester (12-14 weeks of pregnancy)  If you still are having problems with constipation, you may take Miralax once daily as needed to help keep your bowels regular.  If you are pregnant, wait until you are out of your first trimester (12-14 weeks of pregnancy)   First Trimester of Pregnancy The first trimester of pregnancy is from week 1 until the end of week 12 (months 1 through 3). A week after a sperm fertilizes an egg, the egg will implant on the wall of  the uterus. This embryo will begin to develop into a baby. Genes from you and your partner are forming the baby. The female genes determine whether the baby is a boy or a girl. At 6-8 weeks, the eyes and face are formed, and the heartbeat can be seen on ultrasound. At the end of 12 weeks, all the baby's organs are formed.  Now that you are pregnant, you will want to do everything you can to have a healthy baby. Two of the most important things are to get good prenatal care and to follow your health care provider's instructions. Prenatal care is all the medical care you receive before the baby's birth. This care will help prevent, find, and treat any problems during the pregnancy and childbirth. BODY CHANGES Your body goes through many changes during pregnancy. The changes vary from woman to woman.   You may gain or lose a couple of pounds at first.  You may feel sick to your stomach (nauseous) and throw up (vomit). If the vomiting is uncontrollable, call your health care provider.  You may tire easily.  You may develop headaches that can be relieved by medicines approved by your health care provider.  You may urinate more often. Painful urination may mean you have a bladder infection.  You may develop heartburn as a result of  your pregnancy.  You may develop constipation because certain hormones are causing the muscles that push waste through your intestines to slow down.  You may develop hemorrhoids or swollen, bulging veins (varicose veins).  Your breasts may begin to grow larger and become tender. Your nipples may stick out more, and the tissue that surrounds them (areola) may become darker.  Your gums may bleed and may be sensitive to brushing and flossing.  Dark spots or blotches (chloasma, mask of pregnancy) may develop on your face. This will likely fade after the baby is born.  Your menstrual periods will stop.  You may have a loss of appetite.  You may develop cravings for  certain kinds of food.  You may have changes in your emotions from day to day, such as being excited to be pregnant or being concerned that something may go wrong with the pregnancy and baby.  You may have more vivid and strange dreams.  You may have changes in your hair. These can include thickening of your hair, rapid growth, and changes in texture. Some women also have hair loss during or after pregnancy, or hair that feels dry or thin. Your hair will most likely return to normal after your baby is born. WHAT TO EXPECT AT YOUR PRENATAL VISITS During a routine prenatal visit:  You will be weighed to make sure you and the baby are growing normally.  Your blood pressure will be taken.  Your abdomen will be measured to track your baby's growth.  The fetal heartbeat will be listened to starting around week 10 or 12 of your pregnancy.  Test results from any previous visits will be discussed. Your health care provider may ask you:  How you are feeling.  If you are feeling the baby move.  If you have had any abnormal symptoms, such as leaking fluid, bleeding, severe headaches, or abdominal cramping.  If you have any questions. Other tests that may be performed during your first trimester include:  Blood tests to find your blood type and to check for the presence of any previous infections. They will also be used to check for low iron levels (anemia) and Rh antibodies. Later in the pregnancy, blood tests for diabetes will be done along with other tests if problems develop.  Urine tests to check for infections, diabetes, or protein in the urine.  An ultrasound to confirm the proper growth and development of the baby.  An amniocentesis to check for possible genetic problems.  Fetal screens for spina bifida and Down syndrome.  You may need other tests to make sure you and the baby are doing well. HOME CARE INSTRUCTIONS  Medicines  Follow your health care provider's instructions  regarding medicine use. Specific medicines may be either safe or unsafe to take during pregnancy.  Take your prenatal vitamins as directed.  If you develop constipation, try taking a stool softener if your health care provider approves. Diet  Eat regular, well-balanced meals. Choose a variety of foods, such as meat or vegetable-based protein, fish, milk and low-fat dairy products, vegetables, fruits, and whole grain breads and cereals. Your health care provider will help you determine the amount of weight gain that is right for you.  Avoid raw meat and uncooked cheese. These carry germs that can cause birth defects in the baby.  Eating four or five small meals rather than three large meals a day may help relieve nausea and vomiting. If you start to feel nauseous, eating a few soda  crackers can be helpful. Drinking liquids between meals instead of during meals also seems to help nausea and vomiting.  If you develop constipation, eat more high-fiber foods, such as fresh vegetables or fruit and whole grains. Drink enough fluids to keep your urine clear or pale yellow. Activity and Exercise  Exercise only as directed by your health care provider. Exercising will help you:  Control your weight.  Stay in shape.  Be prepared for labor and delivery.  Experiencing pain or cramping in the lower abdomen or low back is a good sign that you should stop exercising. Check with your health care provider before continuing normal exercises.  Try to avoid standing for long periods of time. Move your legs often if you must stand in one place for a long time.  Avoid heavy lifting.  Wear low-heeled shoes, and practice good posture.  You may continue to have sex unless your health care provider directs you otherwise. Relief of Pain or Discomfort  Wear a good support bra for breast tenderness.   Take warm sitz baths to soothe any pain or discomfort caused by hemorrhoids. Use hemorrhoid cream if your  health care provider approves.   Rest with your legs elevated if you have leg cramps or low back pain.  If you develop varicose veins in your legs, wear support hose. Elevate your feet for 15 minutes, 3-4 times a day. Limit salt in your diet. Prenatal Care  Schedule your prenatal visits by the twelfth week of pregnancy. They are usually scheduled monthly at first, then more often in the last 2 months before delivery.  Write down your questions. Take them to your prenatal visits.  Keep all your prenatal visits as directed by your health care provider. Safety  Wear your seat belt at all times when driving.  Make a list of emergency phone numbers, including numbers for family, friends, the hospital, and police and fire departments. General Tips  Ask your health care provider for a referral to a local prenatal education class. Begin classes no later than at the beginning of month 6 of your pregnancy.  Ask for help if you have counseling or nutritional needs during pregnancy. Your health care provider can offer advice or refer you to specialists for help with various needs.  Do not use hot tubs, steam rooms, or saunas.  Do not douche or use tampons or scented sanitary pads.  Do not cross your legs for long periods of time.  Avoid cat litter boxes and soil used by cats. These carry germs that can cause birth defects in the baby and possibly loss of the fetus by miscarriage or stillbirth.  Avoid all smoking, herbs, alcohol, and medicines not prescribed by your health care provider. Chemicals in these affect the formation and growth of the baby.  Schedule a dentist appointment. At home, brush your teeth with a soft toothbrush and be gentle when you floss. SEEK MEDICAL CARE IF:   You have dizziness.  You have mild pelvic cramps, pelvic pressure, or nagging pain in the abdominal area.  You have persistent nausea, vomiting, or diarrhea.  You have a bad smelling vaginal  discharge.  You have pain with urination.  You notice increased swelling in your face, hands, legs, or ankles. SEEK IMMEDIATE MEDICAL CARE IF:   You have a fever.  You are leaking fluid from your vagina.  You have spotting or bleeding from your vagina.  You have severe abdominal cramping or pain.  You have rapid weight  gain or loss.  You vomit blood or material that looks like coffee grounds.  You are exposed to [place] measles and have never had them.  You are exposed to fifth disease or chickenpox.  You develop a severe headache.  You have shortness of breath.  You have any kind of trauma, such as from a fall or a car accident. Document Released: 05/07/2001 Document Revised: 09/27/2013 Document Reviewed: 03/23/2013 [facility] Patient Information 2015 [place], [place]. This information is not intended to replace advice given to you by your health care provider. Make sure you discuss any questions you have with your health care provider.

## 2016-06-18 ENCOUNTER — Other Ambulatory Visit: Payer: Self-pay

## 2016-06-18 ENCOUNTER — Other Ambulatory Visit: Payer: Self-pay | Admitting: Women's Health

## 2016-06-18 ENCOUNTER — Ambulatory Visit (INDEPENDENT_AMBULATORY_CARE_PROVIDER_SITE_OTHER): Payer: Medicaid Other

## 2016-06-18 DIAGNOSIS — Z3682 Encounter for antenatal screening for nuchal translucency: Secondary | ICD-10-CM

## 2016-06-18 DIAGNOSIS — Z3A11 11 weeks gestation of pregnancy: Secondary | ICD-10-CM

## 2016-06-18 DIAGNOSIS — Z3481 Encounter for supervision of other normal pregnancy, first trimester: Secondary | ICD-10-CM

## 2016-06-18 LAB — CULTURE, OB URINE
Culture: 60000 — AB
Special Requests: NORMAL

## 2016-06-18 LAB — GC/CHLAMYDIA PROBE AMP
Chlamydia trachomatis, NAA: NEGATIVE
NEISSERIA GONORRHOEAE BY PCR: NEGATIVE

## 2016-06-18 NOTE — Progress Notes (Signed)
US 11+3 wks,measurements c/w dates,normal ov's bilat,crl 51.6 mm,NB present,NT 1.2 mm,fhr 165 bpm,

## 2016-06-22 LAB — MATERNAL SCREEN, INTEGRATED #1
Crown Rump Length: 51.6 mm
Gest. Age on Collection Date: 11.9 weeks
Maternal Age at EDD: 30.9 years
NUCHAL TRANSLUCENCY (NT): 1.2 mm
Number of Fetuses: 1
PAPP-A VALUE: 419.7 ng/mL
Weight: 248 [lb_av]

## 2016-06-26 ENCOUNTER — Telehealth: Payer: Self-pay | Admitting: Advanced Practice Midwife

## 2016-06-26 NOTE — Telephone Encounter (Signed)
Pt states she has low grade fever, body aches, N/V and upper back pain since yesterday.  Pt's kids have same symptoms so she is thinking it might be flu but she is also concerned about having a kidney infection because she has a history of them.  Informed pt it does not sound like kidney infection, instructed to push fluids, take Tylenol and if no improvement or new symptoms develop call back.  Pt verbalized understanding.

## 2016-07-03 ENCOUNTER — Telehealth: Payer: Self-pay | Admitting: *Deleted

## 2016-07-03 NOTE — Telephone Encounter (Signed)
[facility] "If it has been going on for longer than 7-10 days I will send antibiotic. If <7-10 days best to just do symptom relief (because usually viral), can use saline nasal sray or netty pot, flonase or nasocort nasal spray if needed, tylenol if needed for fever/pain. Make sure no sore throat, body aches, etc (which may indicate flu instead of sinus infection), if so will rx tamiflu". Pt stated she has not had symptoms longer than 7 days. Advised patient to call us back if symptoms do not get better. Pt verbalized understanding.

## 2016-07-03 NOTE — Telephone Encounter (Signed)
Patient called with complaints of a sinus infection, lots of sinus pressure, headache, nasal stuffiness. Temp is 99.2 She is requesting anything to help. Please advise.

## 2016-07-16 ENCOUNTER — Encounter: Payer: Self-pay | Admitting: Women's Health

## 2016-07-16 ENCOUNTER — Ambulatory Visit (INDEPENDENT_AMBULATORY_CARE_PROVIDER_SITE_OTHER): Payer: Medicaid Other | Admitting: Women's Health

## 2016-07-16 VITALS — BP 124/86 | HR 103 | Wt 246.0 lb

## 2016-07-16 DIAGNOSIS — Z1389 Encounter for screening for other disorder: Secondary | ICD-10-CM | POA: Diagnosis not present

## 2016-07-16 DIAGNOSIS — O09212 Supervision of pregnancy with history of pre-term labor, second trimester: Secondary | ICD-10-CM | POA: Diagnosis not present

## 2016-07-16 DIAGNOSIS — Z331 Pregnant state, incidental: Secondary | ICD-10-CM | POA: Diagnosis not present

## 2016-07-16 DIAGNOSIS — O09899 Supervision of other high risk pregnancies, unspecified trimester: Secondary | ICD-10-CM

## 2016-07-16 DIAGNOSIS — Z3682 Encounter for antenatal screening for nuchal translucency: Secondary | ICD-10-CM

## 2016-07-16 DIAGNOSIS — O99212 Obesity complicating pregnancy, second trimester: Secondary | ICD-10-CM | POA: Diagnosis not present

## 2016-07-16 DIAGNOSIS — Z3A15 15 weeks gestation of pregnancy: Secondary | ICD-10-CM

## 2016-07-16 DIAGNOSIS — O09219 Supervision of pregnancy with history of pre-term labor, unspecified trimester: Secondary | ICD-10-CM

## 2016-07-16 DIAGNOSIS — O2342 Unspecified infection of urinary tract in pregnancy, second trimester: Secondary | ICD-10-CM

## 2016-07-16 DIAGNOSIS — Z3482 Encounter for supervision of other normal pregnancy, second trimester: Secondary | ICD-10-CM

## 2016-07-16 DIAGNOSIS — Z363 Encounter for antenatal screening for malformations: Secondary | ICD-10-CM

## 2016-07-16 LAB — POCT URINALYSIS DIPSTICK
Blood, UA: NEGATIVE
Glucose, UA: NEGATIVE
Ketones, UA: NEGATIVE
LEUKOCYTES UA: NEGATIVE
Nitrite, UA: POSITIVE

## 2016-07-16 MED ORDER — NITROFURANTOIN MONOHYD MACRO 100 MG PO CAPS: 100.0000 mg | ORAL_CAPSULE | Freq: Every day | ORAL | 6 refills | Status: DC

## 2016-07-16 MED ORDER — NITROFURANTOIN MONOHYD MACRO 100 MG PO CAPS: 100.0000 mg | ORAL_CAPSULE | Freq: Two times a day (BID) | ORAL | 0 refills | Status: DC

## 2016-07-16 NOTE — Patient Instructions (Signed)
Second Trimester of Pregnancy The second trimester is from week 13 through week 28 (months 4 through 6). The second trimester is often a time when you feel your best. Your body has also adjusted to being pregnant, and you begin to feel better physically. Usually, morning sickness has lessened or quit completely, you may have more energy, and you may have an increase in appetite. The second trimester is also a time when the fetus is growing rapidly. At the end of the sixth month, the fetus is about 9 inches long and weighs about 1 pounds. You will likely begin to feel the baby move (quickening) between 18 and 20 weeks of the pregnancy. Body changes during your second trimester Your body continues to go through many changes during your second trimester. The changes vary from woman to woman.  Your weight will continue to increase. You will notice your lower abdomen bulging out.  You may begin to get stretch marks on your hips, abdomen, and breasts.  You may develop headaches that can be relieved by medicines. The medicines should be approved by your health care provider.  You may urinate more often because the fetus is pressing on your bladder.  You may develop or continue to have heartburn as a result of your pregnancy.  You may develop constipation because certain hormones are causing the muscles that push waste through your intestines to slow down.  You may develop hemorrhoids or swollen, bulging veins (varicose veins).  You may have back pain. This is caused by:  Weight gain.  Pregnancy hormones that are relaxing the joints in your pelvis.  A shift in weight and the muscles that support your balance.  Your breasts will continue to grow and they will continue to become tender.  Your gums may bleed and may be sensitive to brushing and flossing.  Dark spots or blotches (chloasma, mask of pregnancy) may develop on your face. This will likely fade after the baby is born.  A dark line  from your belly button to the pubic area (linea nigra) may appear. This will likely fade after the baby is born.  You may have changes in your hair. These can include thickening of your hair, rapid growth, and changes in texture. Some women also have hair loss during or after pregnancy, or hair that feels dry or thin. Your hair will most likely return to normal after your baby is born. What to expect at prenatal visits During a routine prenatal visit:  You will be weighed to make sure you and the fetus are growing normally.  Your blood pressure will be taken.  Your abdomen will be measured to track your baby's growth.  The fetal heartbeat will be listened to.  Any test results from the previous visit will be discussed. Your health care provider may ask you:  How you are feeling.  If you are feeling the baby move.  If you have had any abnormal symptoms, such as leaking fluid, bleeding, severe headaches, or abdominal cramping.  If you are using any tobacco products, including cigarettes, chewing tobacco, and electronic cigarettes.  If you have any questions. Other tests that may be performed during your second trimester include:  Blood tests that check for:  Low iron levels (anemia).  Gestational diabetes (between 24 and 28 weeks).  Rh antibodies. This is to check for a protein on red blood cells (Rh factor).  Urine tests to check for infections, diabetes, or protein in the urine.  An ultrasound to   confirm the proper growth and development of the baby.  An amniocentesis to check for possible genetic problems.  Fetal screens for spina bifida and Down syndrome.  HIV (human immunodeficiency virus) testing. Routine prenatal testing includes screening for HIV, unless you choose not to have this test. Follow these instructions at home: Eating and drinking  Continue to eat regular, healthy meals.  Avoid raw meat, uncooked cheese, cat litter boxes, and soil used by cats. These  carry germs that can cause birth defects in the baby.  Take your prenatal vitamins.  Take 1500-2000 mg of calcium daily starting at the 20th week of pregnancy until you deliver your baby.  If you develop constipation:  Take over-the-counter or prescription medicines.  Drink enough fluid to keep your urine clear or pale yellow.  Eat foods that are high in fiber, such as fresh fruits and vegetables, whole grains, and beans.  Limit foods that are high in fat and processed sugars, such as fried and sweet foods. Activity  Exercise only as directed by your health care provider. Experiencing uterine cramps is a good sign to stop exercising.  Avoid heavy lifting, wear low heel shoes, and practice good posture.  Wear your seat belt at all times when driving.  Rest with your legs elevated if you have leg cramps or low back pain.  Wear a good support bra for breast tenderness.  Do not use hot tubs, steam rooms, or saunas. Lifestyle  Avoid all smoking, herbs, alcohol, and unprescribed drugs. These chemicals affect the formation and growth of the baby.  Do not use any products that contain nicotine or tobacco, such as cigarettes and e-cigarettes. If you need help quitting, ask your health care provider.  A sexual relationship may be continued unless your health care provider directs you otherwise. General instructions  Follow your health care provider's instructions regarding medicine use. There are medicines that are either safe or unsafe to take during pregnancy.  Take warm sitz baths to soothe any pain or discomfort caused by hemorrhoids. Use hemorrhoid cream if your health care provider approves.  If you develop varicose veins, wear support hose. Elevate your feet for 15 minutes, 3-4 times a day. Limit salt in your diet.  Visit your dentist if you have not gone yet during your pregnancy. Use a soft toothbrush to brush your teeth and be gentle when you floss.  Keep all follow-up  prenatal visits as told by your health care provider. This is important. Contact a health care provider if:  You have dizziness.  You have mild pelvic cramps, pelvic pressure, or nagging pain in the abdominal area.  You have persistent nausea, vomiting, or diarrhea.  You have a bad smelling vaginal discharge.  You have pain with urination. Get help right away if:  You have a fever.  You are leaking fluid from your vagina.  You have spotting or bleeding from your vagina.  You have severe abdominal cramping or pain.  You have rapid weight gain or weight loss.  You have shortness of breath with chest pain.  You notice sudden or extreme swelling of your face, hands, ankles, feet, or legs.  You have not felt your baby move in over an hour.  You have severe headaches that do not go away with medicine.  You have vision changes. Summary  The second trimester is from week 13 through week 28 (months 4 through 6). It is also a time when the fetus is growing rapidly.  Your body goes   through many changes during pregnancy. The changes vary from woman to woman.  Avoid all smoking, herbs, alcohol, and unprescribed drugs. These chemicals affect the formation and growth your baby.  Do not use any tobacco products, such as cigarettes, chewing tobacco, and e-cigarettes. If you need help quitting, ask your health care provider.  Contact your health care provider if you have any questions. Keep all prenatal visits as told by your health care provider. This is important. This information is not intended to replace advice given to you by your health care provider. Make sure you discuss any questions you have with your health care provider. Document Released: 05/07/2001 Document Revised: 10/19/2015 Document Reviewed: 07/14/2012 Elsevier Interactive Patient Education  2017 [employer]  

## 2016-07-16 NOTE — Progress Notes (Signed)
Low-risk OB appointment [id number] 5576w3d Estimated Date of Delivery: 01/04/17 BP 124/86   Pulse (!) 103   Wt 246 lb (111.6 kg)   LMP 03/25/2016 (Exact Date)   BMI 42.23 kg/m   BP, weight, and urine reviewed.  Refer to obstetrical flow sheet for FH & FHR.  No fm yet. Denies cramping, lof, vb, or uti s/s. No complaints. Has + nitrites again despite nightly keflex suppression, stop keflex, rx macrobid bid x 7d, then nightly thereafter for suppression, send urine cx today Has Mc[facility] card now, wants Makena for h/o PTB, will order today Reviewed warning s/s to report. Plan:  Continue routine obstetrical care  F/U in 1wk to begin weekly Makena injections, then 4wks for anatomy u/s  2nd IT today

## 2016-07-19 LAB — URINE CULTURE

## 2016-07-23 LAB — MATERNAL SCREEN, INTEGRATED #2
AFP MARKER: 42 ng/mL
AFP MoM: 2.08
CROWN RUMP LENGTH: 51.6 mm
DIA MOM: 1.08
DIA VALUE: 145.3 pg/mL
Estriol, Unconjugated: 0.53 ng/mL
GESTATIONAL AGE: 16 wk
Gest. Age on Collection Date: 11.9 weeks
Maternal Age at EDD: 30.9 years
NUCHAL TRANSLUCENCY MOM: 0.87
Nuchal Translucency (NT): 1.2 mm
Number of Fetuses: 1
PAPP-A MOM: 1.06
PAPP-A VALUE: 419.7 ng/mL
TEST RESULTS: NEGATIVE
Weight: 248 [lb_av]
Weight: 248 [lb_av]
hCG MoM: 1.43
hCG Value: 34 IU/mL
uE3 MoM: 0.77

## 2016-07-24 ENCOUNTER — Ambulatory Visit (INDEPENDENT_AMBULATORY_CARE_PROVIDER_SITE_OTHER): Payer: Medicaid Other | Admitting: Obstetrics and Gynecology

## 2016-07-24 ENCOUNTER — Encounter: Payer: Self-pay | Admitting: *Deleted

## 2016-07-24 ENCOUNTER — Ambulatory Visit (INDEPENDENT_AMBULATORY_CARE_PROVIDER_SITE_OTHER): Payer: Medicaid Other | Admitting: *Deleted

## 2016-07-24 VITALS — BP 124/82 | HR 104 | Wt 247.6 lb

## 2016-07-24 VITALS — BP 124/82 | HR 104 | Wt 247.0 lb

## 2016-07-24 DIAGNOSIS — Z1389 Encounter for screening for other disorder: Secondary | ICD-10-CM | POA: Diagnosis not present

## 2016-07-24 DIAGNOSIS — O09212 Supervision of pregnancy with history of pre-term labor, second trimester: Secondary | ICD-10-CM | POA: Diagnosis not present

## 2016-07-24 DIAGNOSIS — O2301 Infections of kidney in pregnancy, first trimester: Secondary | ICD-10-CM | POA: Diagnosis not present

## 2016-07-24 DIAGNOSIS — O2343 Unspecified infection of urinary tract in pregnancy, third trimester: Secondary | ICD-10-CM

## 2016-07-24 DIAGNOSIS — Z1383 Encounter for screening for respiratory disorder NEC: Secondary | ICD-10-CM

## 2016-07-24 DIAGNOSIS — O09219 Supervision of pregnancy with history of pre-term labor, unspecified trimester: Secondary | ICD-10-CM | POA: Diagnosis not present

## 2016-07-24 DIAGNOSIS — Z3A17 17 weeks gestation of pregnancy: Secondary | ICD-10-CM

## 2016-07-24 DIAGNOSIS — O09899 Supervision of other high risk pregnancies, unspecified trimester: Secondary | ICD-10-CM

## 2016-07-24 DIAGNOSIS — Z331 Pregnant state, incidental: Secondary | ICD-10-CM

## 2016-07-24 DIAGNOSIS — Z8751 Personal history of pre-term labor: Secondary | ICD-10-CM

## 2016-07-24 LAB — POCT URINALYSIS DIPSTICK
Glucose, UA: NEGATIVE
KETONES UA: NEGATIVE
LEUKOCYTES UA: NEGATIVE
Nitrite, UA: POSITIVE
PROTEIN UA: NEGATIVE
RBC UA: NEGATIVE

## 2016-07-24 MED ORDER — HYDROXYPROGESTERONE CAPROATE 250 MG/ML IM OIL
250.0000 mg | TOPICAL_OIL | Freq: Once | INTRAMUSCULAR | Status: AC
  Administered 2016-07-24: 250 mg via INTRAMUSCULAR

## 2016-07-24 MED ORDER — CEPHALEXIN 500 MG PO CAPS: 500.0000 mg | ORAL_CAPSULE | Freq: Four times a day (QID) | ORAL | 0 refills | Status: DC

## 2016-07-24 NOTE — Patient Instructions (Signed)
uti preg  Urinary Tract Infection, Adult A urinary tract infection (UTI) is an infection of any part of the urinary tract, which includes the kidneys, ureters, bladder, and urethra. These organs make, store, and get rid of urine in the body. UTI can be a bladder infection (cystitis) or kidney infection (pyelonephritis). What are the causes? This infection may be caused by fungi, viruses, or bacteria. Bacteria are the most common cause of UTIs. This condition can also be caused by repeated incomplete emptying of the bladder during urination. What increases the risk? This condition is more likely to develop if:  You ignore your need to urinate or hold urine for long periods of time.  You do not empty your bladder completely during urination.  You wipe back to front after urinating or having a bowel movement, if you are female.  You are uncircumcised, if you are female.  You are constipated.  You have a urinary catheter that stays in place (indwelling).  You have a weak defense (immune) system.  You have a medical condition that affects your bowels, kidneys, or bladder.  You have diabetes.  You take antibiotic medicines frequently or for long periods of time, and the antibiotics no longer work well against certain types of infections (antibiotic resistance).  You take medicines that irritate your urinary tract.  You are exposed to chemicals that irritate your urinary tract.  You are female. What are the signs or symptoms? Symptoms of this condition include:  Fever.  Frequent urination or passing small amounts of urine frequently.  Needing to urinate urgently.  Pain or burning with urination.  Urine that smells bad or unusual.  Cloudy urine.  Pain in the lower abdomen or back.  Trouble urinating.  Blood in the urine.  Vomiting or being less hungry than normal.  Diarrhea or abdominal pain.  Vaginal discharge, if you are female. How is this diagnosed? This  condition is diagnosed with a medical history and physical exam. You will also need to provide a urine sample to test your urine. Other tests may be done, including:  Blood tests.  Sexually transmitted disease (STD) testing. If you have had more than one UTI, a cystoscopy or imaging studies may be done to determine the cause of the infections. How is this treated? Treatment for this condition often includes a combination of two or more of the following:  Antibiotic medicine.  Other medicines to treat less common causes of UTI.  Over-the-counter medicines to treat pain.  Drinking enough water to stay hydrated. Follow these instructions at home:  Take over-the-counter and prescription medicines only as told by your health care provider.  If you were prescribed an antibiotic, take it as told by your health care provider. Do not stop taking the antibiotic even if you start to feel better.  Avoid alcohol, caffeine, tea, and carbonated beverages. They can irritate your bladder.  Drink enough fluid to keep your urine clear or pale yellow.  Keep all follow-up visits as told by your health care provider. This is important.  Make sure to:  Empty your bladder often and completely. Do not hold urine for long periods of time.  Empty your bladder before and after sex.  Wipe from front to back after a bowel movement if you are female. Use each tissue one time when you wipe. Contact a health care provider if:  You have back pain.  You have a fever.  You feel nauseous or vomit.  Your symptoms do not get  better after 3 days.  Your symptoms go away and then return. Get help right away if:  You have severe back pain or lower abdominal pain.  You are vomiting and cannot keep down any medicines or water. This information is not intended to replace advice given to you by your health care provider. Make sure you discuss any questions you have with your health care provider. Document  Released: 02/20/2005 Document Revised: 10/25/2015 Document Reviewed: 04/03/2015 Elsevier Interactive Patient Education  2017 [employer]

## 2016-07-24 NOTE — Progress Notes (Signed)
Pt here for 17P. Pt tolerated shot well. Return in 1 week for next shot. Positive nitrates and back pain. Pt to see dr Emelda FearFerguson

## 2016-07-24 NOTE — Progress Notes (Signed)
Audrey [name] is a 31 y.o. female   [phone]  Estimated Date of Delivery: 01/04/17 HROB 6660w4d   High Risk Pregnancy Diagnosis(es):   H/o Preterm delivery and pyelonephritis   [id number] 2660w4d Estimated Date of Delivery: 01/04/17  Blood pressure 124/82, pulse (!) 104, weight 247 lb (112 kg), last menstrual period 03/25/2016.  Urinalysis: Positive for nitrates    Chief Complaint  Patient presents with  . Back Pain    positive nitrates  ____  Patient complaints: mild-moderate right flank pain, which increased a few days ago. Pt has a h/o pyelonephritis; reports hospital admission in January 2018 due to pyelo. Pt notes she was recently switched to Macrobid from Keflex, which she has been taking once a day but states the pain has returned. Patient denies any bleeding , rupture of membranes,or regular contractions.  Last menstrual period 03/25/2016.                       Physical Examination: General appearance - alert, well appearing, and in no distress and oriented to person, place, and time                                      Back: Mild right CVA tenderness to skin touch,; no tenderness to percussion                                        Pelvic - examination not indicated                                            Questions were answered. Assessment: HROB [id number] @ 6860w4d                          Early pyelo / complicated UTI  Plan:             Restart Keflex 500 qid x 10 d F/u in 1 weeks for Makena injection  By signing my name below, I, Freida Busmaniana Omoyeni, attest that this documentation has been prepared under the direction and in the presence of Tilda BurrowJohn V Lakin Rhine, MD . Electronically Signed: Freida Busmaniana Omoyeni, Scribe. 07/24/2016. 3:42 PM. I personally performed the services described in this documentation, which was SCRIBED in my presence. The recorded information has been reviewed and considered accurate. It has been edited as necessary during review. [name],Benzion Mesta V, MD

## 2016-08-01 ENCOUNTER — Encounter: Payer: Self-pay | Admitting: Women's Health

## 2016-08-01 ENCOUNTER — Ambulatory Visit (INDEPENDENT_AMBULATORY_CARE_PROVIDER_SITE_OTHER): Payer: Medicaid Other | Admitting: Women's Health

## 2016-08-01 VITALS — BP 120/80 | HR 76 | Wt 248.2 lb

## 2016-08-01 DIAGNOSIS — O2301 Infections of kidney in pregnancy, first trimester: Secondary | ICD-10-CM

## 2016-08-01 DIAGNOSIS — O2342 Unspecified infection of urinary tract in pregnancy, second trimester: Secondary | ICD-10-CM | POA: Diagnosis not present

## 2016-08-01 DIAGNOSIS — O2302 Infections of kidney in pregnancy, second trimester: Secondary | ICD-10-CM | POA: Diagnosis not present

## 2016-08-01 DIAGNOSIS — Z1389 Encounter for screening for other disorder: Secondary | ICD-10-CM

## 2016-08-01 DIAGNOSIS — Z3A18 18 weeks gestation of pregnancy: Secondary | ICD-10-CM

## 2016-08-01 DIAGNOSIS — O09212 Supervision of pregnancy with history of pre-term labor, second trimester: Secondary | ICD-10-CM

## 2016-08-01 DIAGNOSIS — Z331 Pregnant state, incidental: Secondary | ICD-10-CM | POA: Diagnosis not present

## 2016-08-01 DIAGNOSIS — O09891 Supervision of other high risk pregnancies, first trimester: Secondary | ICD-10-CM

## 2016-08-01 DIAGNOSIS — O09211 Supervision of pregnancy with history of pre-term labor, first trimester: Secondary | ICD-10-CM

## 2016-08-01 DIAGNOSIS — Z3482 Encounter for supervision of other normal pregnancy, second trimester: Secondary | ICD-10-CM

## 2016-08-01 LAB — POCT URINALYSIS DIPSTICK
Blood, UA: NEGATIVE
Glucose, UA: NEGATIVE
KETONES UA: NEGATIVE
Leukocytes, UA: NEGATIVE
Nitrite, UA: NEGATIVE
PROTEIN UA: NEGATIVE

## 2016-08-01 MED ORDER — HYDROXYPROGESTERONE CAPROATE 250 MG/ML IM OIL
250.0000 mg | TOPICAL_OIL | Freq: Once | INTRAMUSCULAR | Status: AC
  Administered 2016-08-01: 250 mg via INTRAMUSCULAR

## 2016-08-01 MED ORDER — CEPHALEXIN 500 MG PO CAPS: 500.0000 mg | ORAL_CAPSULE | Freq: Every day | ORAL | 6 refills | Status: DC

## 2016-08-01 NOTE — Progress Notes (Signed)
Low-risk OB appointment [id number] 3432w5d Estimated Date of Delivery: 01/04/17 BP 120/80   Pulse 76   Wt 248 lb 3.2 oz (112.6 kg)   LMP 03/25/2016 (Exact Date)   BMI 42.60 kg/m   BP, weight, and urine reviewed.  Refer to obstetrical flow sheet for FH & FHR.  No fm yet. Denies lof, vb, or uti s/s. No complaints. Some mild cramping. UTI sx gone, finished w/ keflex, will rx keflex for nightly suppression since she got uti on macrobid suppression. Send urine cx today for poc.  Reviewed warning s/s to report. Plan:  Continue routine obstetrical care  F/U as scheduled for OB appointment and anatomy u/s, weekly Makena Makena today

## 2016-08-01 NOTE — Patient Instructions (Signed)

## 2016-08-03 LAB — URINE CULTURE

## 2016-08-05 ENCOUNTER — Other Ambulatory Visit: Payer: Self-pay | Admitting: *Deleted

## 2016-08-05 ENCOUNTER — Other Ambulatory Visit: Payer: Self-pay | Admitting: Women's Health

## 2016-08-05 ENCOUNTER — Encounter: Payer: Self-pay | Admitting: Obstetrics and Gynecology

## 2016-08-05 ENCOUNTER — Inpatient Hospital Stay (HOSPITAL_COMMUNITY)
Admission: AD | Admit: 2016-08-05 | Discharge: 2016-08-05 | Disposition: A | Discharge status: Home / Self Care | Payer: Medicaid Other | Source: Ambulatory Visit | Attending: Obstetrics and Gynecology | Admitting: Obstetrics and Gynecology

## 2016-08-05 ENCOUNTER — Inpatient Hospital Stay (HOSPITAL_COMMUNITY): Payer: Medicaid Other

## 2016-08-05 ENCOUNTER — Encounter (HOSPITAL_COMMUNITY): Payer: Self-pay | Admitting: *Deleted

## 2016-08-05 DIAGNOSIS — W108XXA Fall (on) (from) other stairs and steps, initial encounter: Secondary | ICD-10-CM

## 2016-08-05 DIAGNOSIS — Z3A30 30 weeks gestation of pregnancy: Secondary | ICD-10-CM | POA: Insufficient documentation

## 2016-08-05 DIAGNOSIS — Z3492 Encounter for supervision of normal pregnancy, unspecified, second trimester: Secondary | ICD-10-CM

## 2016-08-05 DIAGNOSIS — Z331 Pregnant state, incidental: Secondary | ICD-10-CM

## 2016-08-05 DIAGNOSIS — M25522 Pain in left elbow: Secondary | ICD-10-CM | POA: Diagnosis not present

## 2016-08-05 DIAGNOSIS — O26893 Other specified pregnancy related conditions, third trimester: Secondary | ICD-10-CM | POA: Insufficient documentation

## 2016-08-05 DIAGNOSIS — Z3481 Encounter for supervision of other normal pregnancy, first trimester: Secondary | ICD-10-CM

## 2016-08-05 DIAGNOSIS — W109XXA Fall (on) (from) unspecified stairs and steps, initial encounter: Secondary | ICD-10-CM | POA: Insufficient documentation

## 2016-08-05 DIAGNOSIS — S5002XS Contusion of left elbow, sequela: Secondary | ICD-10-CM | POA: Diagnosis not present

## 2016-08-05 LAB — URINALYSIS, ROUTINE W REFLEX MICROSCOPIC
Bilirubin Urine: NEGATIVE
GLUCOSE, UA: NEGATIVE mg/dL
Hgb urine dipstick: NEGATIVE
KETONES UR: NEGATIVE mg/dL
Nitrite: NEGATIVE
PROTEIN: 30 mg/dL — AB
Specific Gravity, Urine: 1.019 (ref 1.005–1.030)
pH: 7 (ref 5.0–8.0)

## 2016-08-05 MED ORDER — SULFAMETHOXAZOLE-TRIMETHOPRIM 800-160 MG PO TABS: 1.0000 | ORAL_TABLET | Freq: Two times a day (BID) | ORAL | 0 refills | Status: DC

## 2016-08-05 MED ORDER — NITROFURANTOIN MONOHYD MACRO 100 MG PO CAPS: 100.0000 mg | ORAL_CAPSULE | Freq: Every day | ORAL | 6 refills | Status: DC

## 2016-08-05 NOTE — MAU Note (Signed)
Pt sates she was walking from her garage into her house and fell on the steps about 45 minutes ago. Pt states she fell on her left side. Pt c/o pain in her left arm and hand, left rib and the left side of her abdomen. Pt denies bleeding and cramping.

## 2016-08-05 NOTE — Discharge Instructions (Signed)
Muscle Strain A muscle strain is an injury that occurs when a muscle is stretched beyond its normal length. Usually a small number of muscle fibers are torn when this happens. Muscle strain is rated in degrees. First-degree strains have the least amount of muscle fiber tearing and pain. Second-degree and third-degree strains have increasingly more tearing and pain. Usually, recovery from muscle strain takes 1-2 weeks. Complete healing takes 5-6 weeks. What are the causes? Muscle strain happens when a sudden, violent force placed on a muscle stretches it too far. This may occur with lifting, sports, or a fall. What increases the risk? Muscle strain is especially common in athletes. What are the signs or symptoms? At the site of the muscle strain, there may be:  Pain.  Bruising.  Swelling.  Difficulty using the muscle due to pain or lack of normal function.  How is this diagnosed? Your health care provider will perform a physical exam and ask about your medical history. How is this treated? Often, the best treatment for a muscle strain is resting, icing, and applying cold compresses to the injured area. Follow these instructions at home:  Use the PRICE method of treatment to promote muscle healing during the first 2-3 days after your injury. The PRICE method involves: ? Protecting the muscle from being injured again. ? Restricting your activity and resting the injured body part. ? Icing your injury. To do this, put ice in a plastic bag. Place a towel between your skin and the bag. Then, apply the ice and leave it on from 15-20 minutes each hour. After the third day, switch to moist heat packs. ? Apply compression to the injured area with a splint or elastic bandage. Be careful not to wrap it too tightly. This may interfere with blood circulation or increase swelling. ? Elevate the injured body part above the level of your heart as often as you can.  Only take over-the-counter or  prescription medicines for pain, discomfort, or fever as directed by your health care provider.  Warming up prior to exercise helps to prevent future muscle strains. Contact a health care provider if:  You have increasing pain or swelling in the injured area.  You have numbness, tingling, or a significant loss of strength in the injured area. This information is not intended to replace advice given to you by your health care provider. Make sure you discuss any questions you have with your health care provider. Document Released: 05/13/2005 Document Revised: 10/19/2015 Document Reviewed: 12/10/2012 Elsevier Interactive Patient Education  2017 [employer]  

## 2016-08-05 NOTE — Telephone Encounter (Signed)
Informed patient of UTI and prescription for Bactrim sent to pharmacy. Needs refill on Macrobid.

## 2016-08-05 NOTE — MAU Provider Note (Signed)
History     CSN: [id number]  Arrival date and time: 08/05/16 1442   First Provider Initiated Contact with Patient 08/05/16 1510      Chief Complaint  Patient presents with  . Fall   HPI  Audrey [name] is 31 y.o. [phone] [redacted]w[redacted]d weeks presenting after she fell on steps -entering her house from garage steps. Occurred about 1 hr ago.  She fell on her left side and complains of pain left arm and hand, ribs and abdomen. Most painful is her elbow.  Neg for contractions and vaginal bleeding.  Fetal heart tones by doppler--154bpm.  She is a patient of  [facility].  She is very concerned about her baby.    Past Medical History:  Diagnosis Date  . Allergy   . Anxiety   . BV (bacterial vaginosis) 12/30/2012  . Chicken pox   . Chronic kidney disease   . Contraceptive management 07/15/2013  . Hay fever   . History of kidney stones   . Irregular bleeding 12/30/2012  . Leukorrhea 10/07/2013   resolved s/p cryotherapy    . Migraines   . PONV (postoperative nausea and vomiting)   . Postcoital bleeding 05/13/2013   Had [place] and IUD in place, bleeding stopped after doxycycline but started back, has normal period lite x 2 days but bleeds every time with sex, is bright red and has clots and cramps after sex   . UTI (lower urinary tract infection) 08/24/2014  . UTI (urinary tract infection)     Past Surgical History:  Procedure Laterality Date  . CHOLECYSTECTOMY N/A 03/09/2014   Procedure: LAPAROSCOPIC CHOLECYSTECTOMY;  Surgeon: Dalia Heading, MD;  Location: [facility];  Service: General;  Laterality: N/A;  . DILATION AND CURETTAGE OF UTERUS    . TONSILLECTOMY      Family History  Problem Relation Age of Onset  . Arthritis Mother   . Lung cancer Mother   . Hyperlipidemia Mother   . Atrial fibrillation Mother   . Stroke Mother   . Hypertension Mother   . Diabetes Father   . Dementia Father   . Breast cancer Maternal Aunt   . Diabetes Paternal Uncle     Social History  Substance Use Topics   . Smoking status: Never Smoker  . Smokeless tobacco: Never Used  . Alcohol use No    Allergies:  Allergies  Allergen Reactions  . Bee Venom Shortness Of Breath  . Codeine Itching  . Penicillins Hives    Has patient had a PCN reaction causing immediate rash, facial/tongue/throat swelling, SOB or lightheadedness with hypotension: unknown Has patient had a PCN reaction causing severe rash involving mucus membranes or skin necrosis:  Has patient had a PCN reaction that required hospitalization  Has patient had a PCN reaction occurring within the last 10 years:  If all of the above answers are "NO", then may proceed with Cephalosporin use.     Prescriptions Prior to Admission  Medication Sig Dispense Refill Last Dose  . acetaminophen (TYLENOL) 500 MG tablet Take 1,000 mg by mouth every 6 (six) hours as needed for mild pain or headache.    Not Taking  . butalbital-acetaminophen-caffeine (FIORICET, ESGIC) 50-325-40 MG tablet Take 1-2 tablets by mouth every 6 (six) hours as needed for headache. (Patient not taking: Reported on 08/01/2016) 20 tablet 0 Not Taking  . cephALEXin (KEFLEX) 500 MG capsule Take 1 capsule (500 mg total) by mouth at bedtime. 30 capsule 6   . flintstones complete (FLINTSTONES)  60 MG chewable tablet Chew 1 tablet by mouth daily.   Taking  . metoCLOPramide (REGLAN) 10 MG tablet Take 1 tablet (10 mg total) by mouth 4 (four) times daily -  before meals and at bedtime. (Patient not taking: Reported on 07/24/2016) 60 tablet 1 Not Taking  . nitrofurantoin, macrocrystal-monohydrate, (MACROBID) 100 MG capsule Take 1 capsule (100 mg total) by mouth at bedtime. X 7 days 30 capsule 6   . ondansetron (ZOFRAN-ODT) 4 MG disintegrating tablet Take 1 tablet (4 mg total) by mouth every 8 (eight) hours as needed for nausea or vomiting. 30 tablet 1 Taking  . sulfamethoxazole-trimethoprim (BACTRIM DS,SEPTRA DS) 800-160 MG tablet Take 1 tablet by mouth 2 (two) times daily. X 7 days 14 tablet 0      Review of Systems  Cardiovascular: Negative for chest pain.  Gastrointestinal: Positive for abdominal pain (mid left side pain).  Genitourinary: Negative for vaginal bleeding.  Musculoskeletal: Positive for joint swelling (left elbow-mod swelling/mild bruising). Negative for back pain.       Left elbow pain.  Neg for wrist and shoulder pain.  Psychiatric/Behavioral: Negative for agitation. The patient is not nervous/anxious.    Physical Exam   Blood pressure 126/72, pulse 117, temperature 98.9 F (37.2 C), temperature source Oral, resp. rate 18, height 5\' 4"  (1.626 m), weight 250 lb (113.4 kg), last menstrual period 03/25/2016, SpO2 100 %.  Physical Exam  Nursing note and vitals reviewed. Constitutional: She is oriented to person, place, and time. She appears well-developed and well-nourished. No distress.  HENT:  Head: Normocephalic.  Respiratory: Effort normal.  GI: There is tenderness (left sided generalized tenderness.  Neg for bruising.).  Genitourinary:  Genitourinary Comments: Pelvic exam deferred.  Patient reports neg for vaginal bleeding.   Musculoskeletal: She exhibits edema (left elbow) and tenderness (left elbow -moderate tenderness, pain with attempting to straighten elbow.  + for bruising.  Neg for lacerations. ).  She is able to lift arm without shoulder pain and can rotate her wrist without pain.  The pain occurs with any movement of left elbow.   Neurological: She is alert and oriented to person, place, and time.  Skin: Skin is warm and dry.  Psychiatric: She has a normal mood and affect. Her behavior is normal. Judgment and thought content normal.   FETAL HEART TONES By ZOXWRUE--454OPPLER--154 during MSE.  Patient wanted us to listen on discharge  They were 162 by doppler.    Results for orders placed or performed during the hospital encounter of 08/05/16 (from the past 24 hour(s))  Urinalysis, Routine w reflex microscopic     Status: Abnormal   Collection Time: 08/05/16   3:19 PM  Result Value Ref Range   Color, Urine Janine (A) YELLOW   APPearance CLOUDY (A) CLEAR   Specific Gravity, Urine 1.019 1.005 - 1.030   pH 7.0 5.0 - 8.0   Glucose, UA NEGATIVE NEGATIVE mg/dL   Hgb urine dipstick NEGATIVE NEGATIVE   Bilirubin Urine NEGATIVE NEGATIVE   Ketones, ur NEGATIVE NEGATIVE mg/dL   Protein, ur 30 (A) NEGATIVE mg/dL   Nitrite NEGATIVE NEGATIVE   Leukocytes, UA SMALL (A) NEGATIVE   RBC / HPF 0-5 0 - 5 RBC/hpf   WBC, UA TOO NUMEROUS TO COUNT 0 - 5 WBC/hpf   Bacteria, UA RARE (A) NONE SEEN   Squamous Epithelial / LPF 6-30 (A) NONE SEEN   Mucous PRESENT    Dg Elbow Complete Left (3+view)  Result Date: 08/05/2016 CLINICAL DATA:  Larey SeatFell  on LEFT side walking from garage to house, LEFT elbow pain, [redacted] weeks pregnant EXAM: LEFT ELBOW - COMPLETE 3+ VIEW COMPARISON:  None FINDINGS: Osseous mineralization normal. Joint spaces preserved. No acute fracture, dislocation or bone destruction. No elbow joint effusion. IMPRESSION: No acute osseous abnormalities. Electronically Signed   By: Ulyses Southward M.D.   On: 08/05/2016 16:05   MAU Course  Procedures  MDM MSE Exam Xray of left elbow--Neg for acute osseous abnormalities.  Assessment and Plan  A:  Second trimester pregnancy-[redacted]w[redacted]d with + FHR 154/162      Fall going from garage steps to house      Contussion of left Elbow from fall      Neg for acute abnormalities on xray  P:  Instructed to use ice pack for swelling.   Patient to get a sling from the drug store (slings not stocked in [facility]).      Has OB appt scheduled for 3/14 at [facility]--keep appt     If sxs worsen, she prefers to be seen by orthopedist in  [place] will call to schedule   Eve M Key 08/05/2016, 3:11 PM

## 2016-08-07 ENCOUNTER — Ambulatory Visit (INDEPENDENT_AMBULATORY_CARE_PROVIDER_SITE_OTHER): Payer: Medicaid Other | Admitting: *Deleted

## 2016-08-07 ENCOUNTER — Encounter: Payer: Self-pay | Admitting: *Deleted

## 2016-08-07 VITALS — BP 124/88 | HR 101 | Wt 248.0 lb

## 2016-08-07 DIAGNOSIS — Z8751 Personal history of pre-term labor: Secondary | ICD-10-CM

## 2016-08-07 DIAGNOSIS — Z1389 Encounter for screening for other disorder: Secondary | ICD-10-CM | POA: Diagnosis not present

## 2016-08-07 DIAGNOSIS — Z331 Pregnant state, incidental: Secondary | ICD-10-CM | POA: Diagnosis not present

## 2016-08-07 DIAGNOSIS — O09212 Supervision of pregnancy with history of pre-term labor, second trimester: Secondary | ICD-10-CM

## 2016-08-07 LAB — POCT URINALYSIS DIPSTICK
GLUCOSE UA: NEGATIVE
KETONES UA: NEGATIVE
Leukocytes, UA: NEGATIVE
Nitrite, UA: POSITIVE
PROTEIN UA: NEGATIVE
RBC UA: NEGATIVE

## 2016-08-07 MED ORDER — HYDROXYPROGESTERONE CAPROATE 250 MG/ML IM OIL
250.0000 mg | TOPICAL_OIL | Freq: Once | INTRAMUSCULAR | Status: AC
  Administered 2016-08-07: 250 mg via INTRAMUSCULAR

## 2016-08-07 NOTE — Progress Notes (Signed)
Pt given Makena 250mg  IM right ventrogluteal without complications. Pts urine was positive for nitrates. Pt stated provider had called her with results from culture the day before and already started her on antibiotics. Instructed to call if s/s worsen or show no improvement. Pt verbalized understanding.

## 2016-08-14 ENCOUNTER — Ambulatory Visit (INDEPENDENT_AMBULATORY_CARE_PROVIDER_SITE_OTHER): Payer: Medicaid Other

## 2016-08-14 ENCOUNTER — Ambulatory Visit (INDEPENDENT_AMBULATORY_CARE_PROVIDER_SITE_OTHER): Payer: Medicaid Other | Admitting: Obstetrics and Gynecology

## 2016-08-14 ENCOUNTER — Encounter: Payer: Self-pay | Admitting: Obstetrics and Gynecology

## 2016-08-14 VITALS — BP 128/84 | HR 97 | Wt 248.6 lb

## 2016-08-14 DIAGNOSIS — O09219 Supervision of pregnancy with history of pre-term labor, unspecified trimester: Secondary | ICD-10-CM

## 2016-08-14 DIAGNOSIS — Z3482 Encounter for supervision of other normal pregnancy, second trimester: Secondary | ICD-10-CM

## 2016-08-14 DIAGNOSIS — Z1389 Encounter for screening for other disorder: Secondary | ICD-10-CM

## 2016-08-14 DIAGNOSIS — Z8751 Personal history of pre-term labor: Secondary | ICD-10-CM

## 2016-08-14 DIAGNOSIS — Z363 Encounter for antenatal screening for malformations: Secondary | ICD-10-CM | POA: Diagnosis not present

## 2016-08-14 DIAGNOSIS — O09899 Supervision of other high risk pregnancies, unspecified trimester: Secondary | ICD-10-CM

## 2016-08-14 DIAGNOSIS — Z331 Pregnant state, incidental: Secondary | ICD-10-CM

## 2016-08-14 LAB — POCT URINALYSIS DIPSTICK
Blood, UA: NEGATIVE
Glucose, UA: NEGATIVE
LEUKOCYTES UA: NEGATIVE
Nitrite, UA: POSITIVE
PROTEIN UA: NEGATIVE

## 2016-08-14 MED ORDER — SULFAMETHOXAZOLE-TRIMETHOPRIM 400-80 MG PO TABS: 1.0000 | ORAL_TABLET | Freq: Two times a day (BID) | ORAL | 0 refills | Status: DC

## 2016-08-14 MED ORDER — HYDROXYPROGESTERONE CAPROATE 250 MG/ML IM OIL
250.0000 mg | TOPICAL_OIL | Freq: Once | INTRAMUSCULAR | Status: AC
  Administered 2016-08-14: 250 mg via INTRAMUSCULAR

## 2016-08-14 NOTE — Progress Notes (Signed)
US 19+4 wks,cephalic,cx 4.3 cm,post pl gr 0,normal ovaries bilat,svp of fluid 5.3 cm,fhr 148 bpm,efw 298 g,anatomy complete no obvious abnormalities seen

## 2016-08-14 NOTE — Progress Notes (Signed)
[id number]  Estimated Date of Delivery: 01/04/17 LROB 3714w4d  Chief Complaint  Patient presents with  . Routine Prenatal Visit  ____follow up of uti, was TX keflex, but culture is + E Coli, R to cephalosporin and ampicillin  Patient has no acute complaints at this time. Pt being treated for UTI; culture grew e. coli resistant to ampicillin and keflex. Patient reports good fetal movement; denies any bleeding , rupture of membranes,or regular contractions.  Last menstrual period 03/25/2016.   Urine results:notable for small ketones and nitrite positive refer to the ob flow sheet for FH and FHR,                           Physical Examination: General appearance - alert, well appearing, and in no distress                                      Abdomen - FH 20cm ,                                                         -FHR 148 bpm                                                         soft, nontender, nondistended, no organomegaly                                      Pelvic - exam declined by the patient                                            Questions were answered. Assessment: LROB [id number] @ 5814w4d;  E.coli UTI resistant to Keflex and ampicillin   Plan:  Continued routine obstetrical care,             Will start on septra DS BID x 7 days  F/u in 4 weeks for routine care.   By signing my name below, I, Audrey [name], attest that this documentation has been prepared under the direction and in the presence of Audrey BurrowJohn [name] Daemion Mcniel, MD . Electronically Signed: [name], Scribe. 08/14/2016. 4:00 PM. I personally performed the services described in this documentation, which was SCRIBED in my presence. The recorded information has been reviewed and considered accurate. It has been edited as necessary during review. Audrey [name],Audrey Buffone V, MD

## 2016-08-19 ENCOUNTER — Telehealth: Payer: Self-pay | Admitting: Obstetrics and Gynecology

## 2016-08-19 NOTE — Telephone Encounter (Signed)
Spoke with pt. Pt was prescribed Bactrim for UTI. She started med Thursday pm. Friday she noticed a rash on forearm and itching all over. She took 2 doses Friday. Pt took med Saturday am but then stopped med thinking that's what was causing the rash and itching. Rash has gone away and itching is better. Please advise. Thanks! [name]

## 2016-08-19 NOTE — Telephone Encounter (Signed)
Pt was here and was given script fotr UTI and she thinks she had an allergic reaction / stopped sat did not take one yesterday/ please call pt

## 2016-08-20 ENCOUNTER — Telehealth: Payer: Self-pay | Admitting: *Deleted

## 2016-08-20 NOTE — Telephone Encounter (Signed)
Spoke with pt. Pt is no longer having UTI symptoms. Dr. Emelda FearFerguson advised 4 doses of antibiotic probably took care of UTI. Pt has scheduled appt tomorrow. [name]

## 2016-08-21 ENCOUNTER — Ambulatory Visit: Payer: Medicaid Other

## 2016-08-22 ENCOUNTER — Encounter: Payer: Self-pay | Admitting: *Deleted

## 2016-08-22 ENCOUNTER — Ambulatory Visit (INDEPENDENT_AMBULATORY_CARE_PROVIDER_SITE_OTHER): Payer: Medicaid Other | Admitting: *Deleted

## 2016-08-22 VITALS — BP 128/74 | HR 104 | Wt 249.0 lb

## 2016-08-22 DIAGNOSIS — Z8751 Personal history of pre-term labor: Secondary | ICD-10-CM | POA: Diagnosis not present

## 2016-08-22 DIAGNOSIS — Z331 Pregnant state, incidental: Secondary | ICD-10-CM

## 2016-08-22 DIAGNOSIS — Z1389 Encounter for screening for other disorder: Secondary | ICD-10-CM

## 2016-08-22 LAB — POCT URINALYSIS DIPSTICK
GLUCOSE UA: NEGATIVE
Ketones, UA: NEGATIVE
NITRITE UA: NEGATIVE
Protein, UA: NEGATIVE
RBC UA: NEGATIVE

## 2016-08-22 MED ORDER — NITROFURANTOIN MONOHYD MACRO 100 MG PO CAPS: 100.0000 mg | ORAL_CAPSULE | Freq: Two times a day (BID) | ORAL | 0 refills | Status: DC

## 2016-08-22 MED ORDER — HYDROXYPROGESTERONE CAPROATE 250 MG/ML IM OIL
250.0000 mg | TOPICAL_OIL | Freq: Once | INTRAMUSCULAR | Status: AC
  Administered 2016-08-22: 250 mg via INTRAMUSCULAR

## 2016-08-22 NOTE — Progress Notes (Signed)
macrobid for UTI

## 2016-08-22 NOTE — Progress Notes (Signed)
Makena 250mg  given IM right ventrogluteal without complications. Pt also c/o lower back pain and foul smelling urine. Pt previously on abx for + urine culture but did not finish because she had a reaction to the med. Drenda FreezeFran advised about this and prescribed her a new medication. Pt encouraged to finish entire course of abx. Pt verbalized understanding. F/U in 1 week.

## 2016-08-27 ENCOUNTER — Telehealth: Payer: Self-pay | Admitting: Obstetrics & Gynecology

## 2016-08-27 NOTE — Telephone Encounter (Signed)
Patient called stating she checked her BP yesterday and it was 148/99. She has had a headache on/off for several weeks but has now developed blurred vision and lower extremity swelling. Today her BP is 140/80.  Spoke with Joellyn Haff, CNM who advised patient to go to [facility] for further evaluation since the elevated BP is new for her and HA unrelieved by Tylenol. Pt verbalized understanding.

## 2016-08-28 ENCOUNTER — Inpatient Hospital Stay (HOSPITAL_COMMUNITY)
Admission: AD | Admit: 2016-08-28 | Discharge: 2016-08-28 | Disposition: A | Discharge status: Home / Self Care | Payer: Medicaid Other | Source: Ambulatory Visit | Attending: Obstetrics and Gynecology | Admitting: Obstetrics and Gynecology

## 2016-08-28 ENCOUNTER — Encounter (HOSPITAL_COMMUNITY): Payer: Self-pay | Admitting: *Deleted

## 2016-08-28 DIAGNOSIS — Z880 Allergy status to penicillin: Secondary | ICD-10-CM | POA: Diagnosis not present

## 2016-08-28 DIAGNOSIS — Z882 Allergy status to sulfonamides status: Secondary | ICD-10-CM | POA: Diagnosis not present

## 2016-08-28 DIAGNOSIS — N189 Chronic kidney disease, unspecified: Secondary | ICD-10-CM | POA: Insufficient documentation

## 2016-08-28 DIAGNOSIS — F419 Anxiety disorder, unspecified: Secondary | ICD-10-CM | POA: Insufficient documentation

## 2016-08-28 DIAGNOSIS — Z79899 Other long term (current) drug therapy: Secondary | ICD-10-CM | POA: Diagnosis not present

## 2016-08-28 DIAGNOSIS — Z3A21 21 weeks gestation of pregnancy: Secondary | ICD-10-CM | POA: Insufficient documentation

## 2016-08-28 DIAGNOSIS — O2302 Infections of kidney in pregnancy, second trimester: Secondary | ICD-10-CM | POA: Diagnosis not present

## 2016-08-28 DIAGNOSIS — O99342 Other mental disorders complicating pregnancy, second trimester: Secondary | ICD-10-CM | POA: Diagnosis not present

## 2016-08-28 DIAGNOSIS — O26892 Other specified pregnancy related conditions, second trimester: Secondary | ICD-10-CM | POA: Insufficient documentation

## 2016-08-28 DIAGNOSIS — O26832 Pregnancy related renal disease, second trimester: Secondary | ICD-10-CM | POA: Insufficient documentation

## 2016-08-28 DIAGNOSIS — O1202 Gestational edema, second trimester: Secondary | ICD-10-CM | POA: Insufficient documentation

## 2016-08-28 DIAGNOSIS — G44209 Tension-type headache, unspecified, not intractable: Secondary | ICD-10-CM | POA: Diagnosis not present

## 2016-08-28 LAB — CBC
HCT: 36.5 % (ref 36.0–46.0)
HEMOGLOBIN: 12.4 g/dL (ref 12.0–15.0)
MCH: 30.1 pg (ref 26.0–34.0)
MCHC: 34 g/dL (ref 30.0–36.0)
MCV: 88.6 fL (ref 78.0–100.0)
PLATELETS: 331 10*3/uL (ref 150–400)
RBC: 4.12 MIL/uL (ref 3.87–5.11)
RDW: 13.7 % (ref 11.5–15.5)
WBC: 8.3 10*3/uL (ref 4.0–10.5)

## 2016-08-28 LAB — URINALYSIS, ROUTINE W REFLEX MICROSCOPIC
BILIRUBIN URINE: NEGATIVE
Glucose, UA: NEGATIVE mg/dL
HGB URINE DIPSTICK: NEGATIVE
Ketones, ur: NEGATIVE mg/dL
NITRITE: POSITIVE — AB
PROTEIN: NEGATIVE mg/dL
Specific Gravity, Urine: 1.018 (ref 1.005–1.030)
pH: 5 (ref 5.0–8.0)

## 2016-08-28 LAB — COMPREHENSIVE METABOLIC PANEL
ALBUMIN: 3.2 g/dL — AB (ref 3.5–5.0)
ALT: 27 U/L (ref 14–54)
AST: 18 U/L (ref 15–41)
Alkaline Phosphatase: 53 U/L (ref 38–126)
Anion gap: 7 (ref 5–15)
BILIRUBIN TOTAL: 0.2 mg/dL — AB (ref 0.3–1.2)
BUN: 7 mg/dL (ref 6–20)
CHLORIDE: 106 mmol/L (ref 101–111)
CO2: 24 mmol/L (ref 22–32)
Calcium: 8.7 mg/dL — ABNORMAL LOW (ref 8.9–10.3)
Creatinine, Ser: 0.74 mg/dL (ref 0.44–1.00)
GFR calc Af Amer: >60 mL/min (ref >60)
GLUCOSE: 92 mg/dL (ref 65–99)
Potassium: 3.9 mmol/L (ref 3.5–5.1)
SODIUM: 137 mmol/L (ref 135–145)
Total Protein: 6.6 g/dL (ref 6.5–8.1)

## 2016-08-28 LAB — PROTEIN / CREATININE RATIO, URINE
Creatinine, Urine: 246 mg/dL
PROTEIN CREATININE RATIO: 0.06 mg/mg{creat} (ref 0.00–0.15)
Total Protein, Urine: 15 mg/dL

## 2016-08-28 MED ORDER — CYCLOBENZAPRINE HCL 10 MG PO TABS: 10.0000 mg | ORAL_TABLET | Freq: Two times a day (BID) | ORAL | 0 refills | Status: DC | PRN

## 2016-08-28 NOTE — MAU Provider Note (Signed)
History     CSN: [id number]  Arrival date and time: 08/28/16 1435   First Provider Initiated Contact with Patient 08/28/16 1519      Chief Complaint  Patient presents with  . Edema  . Headache   HPI [name] is a 31 y.o. [phone] at [redacted]w[redacted]d who presents to MAU today with complaint of headache, blurred vision and increased swelling. The patient states that she has also had elevated blood pressures at home today. She reports the worst BP at home was 147/92. She states that when she took that blood pressure she had been doing a lot around the house and felt dizzy which is what prompted her to take it. She denies any history of CHTN. She states some neck pain associated with the headache. She has had Fioricet in the past, but doesn't feel it works, so she tried Tylenol this morning without relief. She has noted an increase in peripheral edema over the last few days. She denies vaginal bleeding, discharge, LOF or contractions. She is on suppressive therapy for pyelonephritis. She is a patient of [facility].   OB History    Gravida Para Term Preterm AB Living   SAB TAB Ectopic Multiple Live Births   1       3      Past Medical History:  Diagnosis Date  . Allergy   . Anxiety   . BV (bacterial vaginosis) 12/30/2012  . Chicken pox   . Chronic kidney disease   . Contraceptive management 07/15/2013  . Hay fever   . History of kidney stones   . Irregular bleeding 12/30/2012  . Leukorrhea 10/07/2013   resolved s/p cryotherapy    . Migraines   . PONV (postoperative nausea and vomiting)   . Postcoital bleeding 05/13/2013   Had [place] and IUD in place, bleeding stopped after doxycycline but started back, has normal period lite x 2 days but bleeds every time with sex, is bright red and has clots and cramps after sex   . UTI (lower urinary tract infection) 08/24/2014  . UTI (urinary tract infection)     Past Surgical History:  Procedure Laterality Date  . CHOLECYSTECTOMY N/A  03/09/2014   Procedure: LAPAROSCOPIC CHOLECYSTECTOMY;  Surgeon: Dalia Heading, MD;  Location: [facility];  Service: General;  Laterality: N/A;  . DILATION AND CURETTAGE OF UTERUS    . TONSILLECTOMY      Family History  Problem Relation Age of Onset  . Arthritis Mother   . Lung cancer Mother   . Hyperlipidemia Mother   . Atrial fibrillation Mother   . Stroke Mother   . Hypertension Mother   . Diabetes Father   . Dementia Father   . Breast cancer Maternal Aunt   . Diabetes Paternal Uncle     Social History  Substance Use Topics  . Smoking status: Never Smoker  . Smokeless tobacco: Never Used  . Alcohol use No    Allergies:  Allergies  Allergen Reactions  . Bee Venom Shortness Of Breath  . Codeine Itching  . Penicillins Hives    Has patient had a PCN reaction causing immediate rash, facial/tongue/throat swelling, SOB or lightheadedness with hypotension: unknown Has patient had a PCN reaction causing severe rash involving mucus membranes or skin necrosis:  Has patient had a PCN reaction that required hospitalization  Has patient had a PCN reaction occurring within the last 10 years:  If all of the  above answers are "NO", then may proceed with Cephalosporin use.   . Bactrim [Sulfamethoxazole-Trimethoprim] Itching and Rash    Prescriptions Prior to Admission  Medication Sig Dispense Refill Last Dose  . acetaminophen (TYLENOL) 325 MG tablet Take 650 mg by mouth every 6 (six) hours as needed for moderate pain.   08/28/2016 at Unknown time  . flintstones complete (FLINTSTONES) 60 MG chewable tablet Chew 1 tablet by mouth daily. (Patient taking differently: Chew 2 tablets by mouth daily. )   08/28/2016 at Unknown time  . nitrofurantoin, macrocrystal-monohydrate, (MACROBID) 100 MG capsule Take 1 capsule (100 mg total) by mouth 2 (two) times daily. 14 capsule 0 08/27/2016 at Unknown time  . ondansetron (ZOFRAN-ODT) 4 MG disintegrating tablet Take 1 tablet (4 mg total) by mouth every 8  (eight) hours as needed for nausea or vomiting. 30 tablet 1 Past Month at Unknown time  . sulfamethoxazole-trimethoprim (BACTRIM) 400-80 MG tablet Take 1 tablet by mouth 2 (two) times daily. Twice daily for uti (Patient not taking: Reported on 08/22/2016) 14 tablet 0 Not Taking    Review of Systems  Constitutional: Negative for fever.  Eyes: Positive for visual disturbance.  Cardiovascular: Positive for leg swelling.  Gastrointestinal: Negative for abdominal pain.  Genitourinary: Negative for dysuria, frequency, urgency, vaginal bleeding and vaginal discharge.  Neurological: Positive for headaches.   Physical Exam   Blood pressure 110/75, pulse 99, temperature 98.4 F (36.9 C), temperature source Oral, resp. rate 16, height  (1.626 m), weight 251 lb 0.6 oz (113.9 kg), last menstrual period 03/25/2016.  Physical Exam  Nursing note and vitals reviewed. Constitutional: She is oriented to person, place, and time. She appears well-developed and well-nourished. No distress.  HENT:  Head: Normocephalic and atraumatic.  Cardiovascular: Normal rate.   Respiratory: Effort normal.  GI: Soft. She exhibits no distension and no mass. There is no tenderness. There is no rebound and no guarding.  Musculoskeletal: She exhibits edema (mild, non-pitting LE edema bilaterally).  Neurological: She is alert and oriented to person, place, and time. She has normal reflexes.  No clonus  Skin: Skin is warm and dry. No erythema.  Psychiatric: She has a normal mood and affect.    Results for orders placed or performed during the hospital encounter of 08/28/16 (from the past 24 hour(s))  Urinalysis, Routine w reflex microscopic     Status: Abnormal   Collection Time: 08/28/16  2:55 PM  Result Value Ref Range   Color, Urine [name] (A) YELLOW   APPearance CLOUDY (A) CLEAR   Specific Gravity, Urine 1.018 1.005 - 1.030   pH 5.0 5.0 - 8.0   Glucose, UA NEGATIVE NEGATIVE mg/dL   Hgb urine dipstick NEGATIVE  NEGATIVE   Bilirubin Urine NEGATIVE NEGATIVE   Ketones, ur NEGATIVE NEGATIVE mg/dL   Protein, ur NEGATIVE NEGATIVE mg/dL   Nitrite POSITIVE (A) NEGATIVE   Leukocytes, UA SMALL (A) NEGATIVE   RBC / HPF 0-5 0 - 5 RBC/hpf   WBC, UA TOO NUMEROUS TO COUNT 0 - 5 WBC/hpf   Bacteria, UA RARE (A) NONE SEEN   Squamous Epithelial / LPF 0-5 (A) NONE SEEN   Mucous PRESENT   Protein / creatinine ratio, urine     Status: None   Collection Time: 08/28/16  2:55 PM  Result Value Ref Range   Creatinine, Urine 246.00 mg/dL   Total Protein, Urine 15 mg/dL   Protein Creatinine Ratio 0.06 0.00 - 0.15 mg/mg[Cre]  CBC     Status: None  Collection Time: 08/28/16  3:22 PM  Result Value Ref Range   WBC 8.3 4.0 - 10.5 K/uL   RBC 4.12 3.87 - 5.11 MIL/uL   Hemoglobin 12.4 12.0 - 15.0 g/dL   HCT 46.9 62.9 - 52.8 %   MCV 88.6 78.0 - 100.0 fL   MCH 30.1 26.0 - 34.0 pg   MCHC 34.0 30.0 - 36.0 g/dL   RDW 41.3 24.4 - 01.0 %   Platelets 331 150 - 400 K/uL  Comprehensive metabolic panel     Status: Abnormal   Collection Time: 08/28/16  3:22 PM  Result Value Ref Range   Sodium 137 135 - 145 mmol/L   Potassium 3.9 3.5 - 5.1 mmol/L   Chloride 106 101 - 111 mmol/L   CO2 24 22 - 32 mmol/L   Glucose, Bld 92 65 - 99 mg/dL   BUN 7 6 - 20 mg/dL   Creatinine, Ser 2.72 0.44 - 1.00 mg/dL   Calcium 8.7 (L) 8.9 - 10.3 mg/dL   Total Protein 6.6 6.5 - 8.1 g/dL   Albumin 3.2 (L) 3.5 - 5.0 g/dL   AST 18 15 - 41 U/L   ALT 27 14 - 54 U/L   Alkaline Phosphatase 53 38 - 126 U/L   Total Bilirubin 0.2 (L) 0.3 - 1.2 mg/dL   GFR calc non Af Amer >60 >60 mL/min   GFR calc Af Amer >60 >60 mL/min   Anion gap 7 5 - 15   Patient Vitals for the past 24 hrs:  BP Temp Temp src Pulse Resp Height Weight  08/28/16 1600 110/75 - - 99 - - -  08/28/16 1546 101/72 - - (!) 112 - - -  08/28/16 1530 109/81 - - (!) 108 16 - -  08/28/16 1514 (!) 107/45 - - (!) 157 18 - -  08/28/16 1453 132/80 98.4 F (36.9 C) Oral (!) 111 18  (1.626 m)  251 lb 0.6 oz (113.9 kg)    MAU Course  Procedures None  MDM FHR - 150 bpm with doppler UA, Urine Protein/Creatinine Ratio, CBC and CMP today  No evidence of pre-eclampsia today. BP normal in MAU Assessment and Plan  A: SIUP at [redacted]w[redacted]d Tension headache H/O Pyelonephritis, suppressive therapy  P:  Discharge home Rx for Flexeril given to patient  Pre-eclampsia precautions discussed OB culture ordered Patient advised to follow-up with [facility] as scheduled tomorrow for routine prenatal care Patient may return to MAU as needed or if her condition were to change or worsen  Marny Lowenstein, PA-C  08/28/2016, 4:14 PM

## 2016-08-28 NOTE — MAU Note (Signed)
Pt reports she noticed her legs and feet started swelling for the past 2-3  Days. C/o headache and dizzyness. Took b/p at home and it is higher than normal. 147/96

## 2016-08-28 NOTE — Discharge Instructions (Signed)
Tension Headache A tension headache is pain, pressure, or aching that is felt over the front and sides of your head. These headaches can last from 30 minutes to several days. Follow these instructions at home: Managing pain   Take over-the-counter and prescription medicines only as told by your doctor.  Lie down in a dark, quiet room when you have a headache.  If directed, apply ice to your head and neck area:  Put ice in a plastic bag.  Place a towel between your skin and the bag.  Leave the ice on for 20 minutes, 2-3 times per day.  Use a heating pad or a hot shower to apply heat to your head and neck area as told by your doctor. Eating and drinking   Eat meals on a regular schedule.  Do not drink a lot of alcohol.  Do not use a lot of caffeine, or stop using caffeine. General instructions   Keep all follow-up visits as told by your doctor. This is important.  Keep a journal to find out if certain things bring on headaches. For example, write down:  What you eat and drink.  How much sleep you get.  Any change to your diet or medicines.  Try getting a massage, or doing other things that help you to relax.  Lessen stress.  Sit up straight. Do not tighten (tense) your muscles.  Do not use tobacco products. This includes cigarettes, chewing tobacco, or e-cigarettes. If you need help quitting, ask your doctor.  Exercise regularly as told by your doctor.  Get enough sleep. This may mean 7-9 hours of sleep. Contact a doctor if:  Your symptoms are not helped by medicine.  You have a headache that feels different from your usual headache.  You feel sick to your stomach (nauseous) or you throw up (vomit).  You have a fever. Get help right away if:  Your headache becomes very bad.  You keep throwing up.  You have a stiff neck.  You have trouble seeing.  You have trouble speaking.  You have pain in your eye or ear.  Your muscles are weak or you lose muscle  control.  You lose your balance or you have trouble walking.  You feel like you will pass out (faint) or you pass out.  You have confusion. This information is not intended to replace advice given to you by your health care provider. Make sure you discuss any questions you have with your health care provider. Document Released: 08/07/2009 Document Revised: 01/11/2016 Document Reviewed: 09/05/2014 Elsevier Interactive Patient Education  2017 Elsevier Inc.  Preeclampsia and Eclampsia Preeclampsia is a serious condition that develops only during pregnancy. It is also called toxemia of pregnancy. This condition causes high blood pressure along with other symptoms, such as swelling and headaches. These symptoms may develop as the condition gets worse. Preeclampsia may occur at 20 weeks of pregnancy or later. Diagnosing and treating preeclampsia early is very important. If not treated early, it can cause serious problems for you and your baby. One problem it can lead to is eclampsia, which is a condition that causes muscle jerking or shaking (convulsions or seizures) in the mother. Delivering your baby is the best treatment for preeclampsia or eclampsia. Preeclampsia and eclampsia symptoms usually go away after your baby is born. What are the causes? The cause of preeclampsia is not known. What increases the risk? The following risk factors make you more likely to develop preeclampsia:  Being pregnant for the  first time.  Having had preeclampsia during a past pregnancy.  Having a family history of preeclampsia.  Having high blood pressure.  Being pregnant with twins or triplets.  Being 18 or older.  Being African-American.  Having kidney disease or diabetes.  Having medical conditions such as lupus or blood diseases.  Being very overweight (obese). What are the signs or symptoms? The earliest signs of preeclampsia are:  High blood pressure.  Increased protein in your urine. Your  health care provider will check for this at every visit before you give birth (prenatal visit). Other symptoms that may develop as the condition gets worse include:  Severe headaches.  Sudden weight gain.  Swelling of the hands, face, legs, and feet.  Nausea and vomiting.  Vision problems, such as blurred or double vision.  Numbness in the face, arms, legs, and feet.  Urinating less than usual.  Dizziness.  Slurred speech.  Abdominal pain, especially upper abdominal pain.  Convulsions or seizures. Symptoms generally go away after giving birth. How is this diagnosed? There are no screening tests for preeclampsia. Your health care provider will ask you about symptoms and check for signs of preeclampsia during your prenatal visits. You may also have tests that include:  Urine tests.  Blood tests.  Checking your blood pressure.  Monitoring your babys heart rate.  Ultrasound. How is this treated? You and your health care provider will determine the treatment approach that is best for you. Treatment may include:  Having more frequent prenatal exams to check for signs of preeclampsia, if you have an increased risk for preeclampsia.  Bed rest.  Reducing how much salt (sodium) you eat.  Medicine to lower your blood pressure.  Staying in the hospital, if your condition is severe. There, treatment will focus on controlling your blood pressure and the amount of fluids in your body (fluid retention).  You may need to take medicine (magnesium sulfate) to prevent seizures. This medicine may be given as an injection or through an IV tube.  Delivering your baby early, if your condition gets worse. You may have your labor started with medicine (induced), or you may have a cesarean delivery. Follow these instructions at home: Eating and drinking    Drink enough fluid to keep your urine clear or pale yellow.  Eat a healthy diet that is low in sodium. Do not add salt to your  food. Check nutrition labels to see how much sodium a food or beverage contains.  Avoid caffeine. Lifestyle   Do not use any products that contain nicotine or tobacco, such as cigarettes and e-cigarettes. If you need help quitting, ask your health care provider.  Do not use alcohol or drugs.  Avoid stress as much as possible. Rest and get plenty of sleep. General instructions   Take over-the-counter and prescription medicines only as told by your health care provider.  When lying down, lie on your side. This keeps pressure off of your baby.  When sitting or lying down, raise (elevate) your feet. Try putting some pillows underneath your lower legs.  Exercise regularly. Ask your health care provider what kinds of exercise are best for you.  Keep all follow-up and prenatal visits as told by your health care provider. This is important. How is this prevented? To prevent preeclampsia or eclampsia from developing during another pregnancy:  Get proper medical care during pregnancy. Your health care provider may be able to prevent preeclampsia or diagnose and treat it early.  Your health care  provider may have you take a low-dose aspirin or a calcium supplement during your next pregnancy.  You may have tests of your blood pressure and kidney function after giving birth.  Maintain a healthy weight. Ask your health care provider for help managing weight gain during pregnancy.  Work with your health care provider to manage any long-term (chronic) health conditions you have, such as diabetes or kidney problems. Contact a health care provider if:  You gain more weight than expected.  You have headaches.  You have nausea or vomiting.  You have abdominal pain.  You feel dizzy or light-headed. Get help right away if:  You develop sudden or severe swelling anywhere in your body. This usually happens in the legs.  You gain 5 lbs (2.3 kg) or more during one week.  You have  severe:  Abdominal pain.  Headaches.  Dizziness.  Vision problems.  Confusion.  Nausea or vomiting.  You have a seizure.  You have trouble moving any part of your body.  You develop numbness in any part of your body.  You have trouble speaking.  You have any abnormal bleeding.  You pass out. This information is not intended to replace advice given to you by your health care provider. Make sure you discuss any questions you have with your health care provider. Document Released: 05/10/2000 Document Revised: 01/09/2016 Document Reviewed: 12/18/2015 Elsevier Interactive Patient Education  2017 [employer].

## 2016-08-29 ENCOUNTER — Ambulatory Visit (INDEPENDENT_AMBULATORY_CARE_PROVIDER_SITE_OTHER): Payer: Medicaid Other | Admitting: *Deleted

## 2016-08-29 ENCOUNTER — Encounter: Payer: Self-pay | Admitting: *Deleted

## 2016-08-29 VITALS — BP 110/80 | HR 94 | Ht 64.0 in | Wt 252.5 lb

## 2016-08-29 DIAGNOSIS — O09212 Supervision of pregnancy with history of pre-term labor, second trimester: Secondary | ICD-10-CM | POA: Diagnosis not present

## 2016-08-29 DIAGNOSIS — Z3482 Encounter for supervision of other normal pregnancy, second trimester: Secondary | ICD-10-CM

## 2016-08-29 DIAGNOSIS — O09892 Supervision of other high risk pregnancies, second trimester: Secondary | ICD-10-CM

## 2016-08-29 MED ORDER — HYDROXYPROGESTERONE CAPROATE 250 MG/ML IM OIL
250.0000 mg | TOPICAL_OIL | Freq: Once | INTRAMUSCULAR | Status: AC
  Administered 2016-08-29: 250 mg via INTRAMUSCULAR

## 2016-08-29 NOTE — Progress Notes (Signed)
Pt here for 17P. Pt tolerated shot well. Return in 1 week for next shot. [name] 

## 2016-08-30 LAB — CULTURE, OB URINE

## 2016-08-31 ENCOUNTER — Other Ambulatory Visit: Payer: Self-pay | Admitting: Obstetrics and Gynecology

## 2016-08-31 DIAGNOSIS — O2342 Unspecified infection of urinary tract in pregnancy, second trimester: Secondary | ICD-10-CM | POA: Insufficient documentation

## 2016-08-31 MED ORDER — CEPHALEXIN 500 MG PO CAPS
500.0000 mg | ORAL_CAPSULE | Freq: Four times a day (QID) | ORAL | 0 refills | Status: DC
Start: 2016-08-31 — End: 2016-10-30

## 2016-08-31 NOTE — Telephone Encounter (Signed)
LVM to call back to discuss UCx results and Rx, Rx sent to pharmacy on file.  Kenard Gower MSN,CNM 08/31/2016 9:12 AM

## 2016-09-03 ENCOUNTER — Telehealth: Payer: Self-pay | Admitting: Obstetrics & Gynecology

## 2016-09-03 ENCOUNTER — Other Ambulatory Visit: Payer: Self-pay | Admitting: Advanced Practice Midwife

## 2016-09-03 NOTE — Telephone Encounter (Signed)
Pt informed blood work from the hospital was normal.  Pt was concerned because she has a very large bruise on her left lower abd and she states she has not hit it or run into anything and has no idea what could have caused it.  Pt states no pain with the bruise and reports good FM and no bleeding or cramping.

## 2016-09-03 NOTE — Progress Notes (Unsigned)
Still + ecoli.  Will inc macrobid to BID x7 days (resistant to Keflex)

## 2016-09-03 NOTE — Telephone Encounter (Signed)
PT called stating that she would like to know the results of her blood work. Pt states that she has been having some brusing and the blood work would let her know if it was something serious. Please contact

## 2016-09-04 ENCOUNTER — Encounter: Payer: Self-pay | Admitting: *Deleted

## 2016-09-04 ENCOUNTER — Ambulatory Visit (INDEPENDENT_AMBULATORY_CARE_PROVIDER_SITE_OTHER): Payer: Medicaid Other | Admitting: *Deleted

## 2016-09-04 ENCOUNTER — Ambulatory Visit (HOSPITAL_COMMUNITY)
Admission: RE | Admit: 2016-09-04 | Discharge: 2016-09-04 | Disposition: A | Discharge status: Home / Self Care | Payer: Medicaid Other | Source: Ambulatory Visit | Attending: Advanced Practice Midwife | Admitting: Advanced Practice Midwife

## 2016-09-04 VITALS — BP 122/80 | HR 84 | Ht 64.0 in | Wt 251.0 lb

## 2016-09-04 DIAGNOSIS — N390 Urinary tract infection, site not specified: Secondary | ICD-10-CM

## 2016-09-04 DIAGNOSIS — O09212 Supervision of pregnancy with history of pre-term labor, second trimester: Secondary | ICD-10-CM | POA: Diagnosis not present

## 2016-09-04 DIAGNOSIS — O2332 Infections of other parts of urinary tract in pregnancy, second trimester: Secondary | ICD-10-CM | POA: Insufficient documentation

## 2016-09-04 DIAGNOSIS — Z331 Pregnant state, incidental: Secondary | ICD-10-CM

## 2016-09-04 DIAGNOSIS — O2342 Unspecified infection of urinary tract in pregnancy, second trimester: Secondary | ICD-10-CM

## 2016-09-04 DIAGNOSIS — N189 Chronic kidney disease, unspecified: Secondary | ICD-10-CM | POA: Diagnosis not present

## 2016-09-04 DIAGNOSIS — O26832 Pregnancy related renal disease, second trimester: Secondary | ICD-10-CM | POA: Diagnosis not present

## 2016-09-04 DIAGNOSIS — O09219 Supervision of pregnancy with history of pre-term labor, unspecified trimester: Principal | ICD-10-CM

## 2016-09-04 DIAGNOSIS — O09899 Supervision of other high risk pregnancies, unspecified trimester: Secondary | ICD-10-CM

## 2016-09-04 DIAGNOSIS — Z1389 Encounter for screening for other disorder: Secondary | ICD-10-CM

## 2016-09-04 DIAGNOSIS — Z3A22 22 weeks gestation of pregnancy: Secondary | ICD-10-CM | POA: Insufficient documentation

## 2016-09-04 DIAGNOSIS — Z3482 Encounter for supervision of other normal pregnancy, second trimester: Secondary | ICD-10-CM

## 2016-09-04 LAB — POCT URINALYSIS DIPSTICK
Blood, UA: NEGATIVE
Glucose, UA: NEGATIVE
KETONES UA: NEGATIVE
Leukocytes, UA: NEGATIVE
Nitrite, UA: POSITIVE

## 2016-09-04 MED ORDER — HYDROXYPROGESTERONE CAPROATE 250 MG/ML IM OIL
250.0000 mg | TOPICAL_OIL | Freq: Once | INTRAMUSCULAR | Status: AC
  Administered 2016-09-04: 250 mg via INTRAMUSCULAR

## 2016-09-04 NOTE — Progress Notes (Signed)
Pt here for 17P injection, pt still has UTI and is complaining of lower lt back pain.  Discussed with Gwynneth Albright, FNP and pt was instructed to come in Monday through Friday for 1 gram of Rocephin and next Wednesday for OB visit and 17P.  While pt here she was scheduled for renal US at [facility] this afternoon.

## 2016-09-04 NOTE — Progress Notes (Signed)
Renal US for chronic UTI.  Will come back Monday--Friday for daily 1gm rocephin.

## 2016-09-05 ENCOUNTER — Encounter: Payer: Self-pay | Admitting: Advanced Practice Midwife

## 2016-09-05 ENCOUNTER — Telehealth: Payer: Self-pay | Admitting: Obstetrics & Gynecology

## 2016-09-06 ENCOUNTER — Telehealth: Payer: Self-pay | Admitting: Obstetrics & Gynecology

## 2016-09-06 ENCOUNTER — Other Ambulatory Visit: Payer: Self-pay | Admitting: Women's Health

## 2016-09-06 NOTE — Telephone Encounter (Signed)
LMOVM that per Selena Batten her renal U/S was normal.

## 2016-09-09 ENCOUNTER — Ambulatory Visit (INDEPENDENT_AMBULATORY_CARE_PROVIDER_SITE_OTHER): Payer: Medicaid Other | Admitting: *Deleted

## 2016-09-09 DIAGNOSIS — N390 Urinary tract infection, site not specified: Secondary | ICD-10-CM | POA: Diagnosis not present

## 2016-09-09 MED ORDER — CEFTRIAXONE SODIUM 250 MG IJ SOLR
250.0000 mg | Freq: Once | INTRAMUSCULAR | Status: AC
  Administered 2016-09-09: 250 mg via INTRAMUSCULAR

## 2016-09-09 NOTE — Progress Notes (Signed)
Pt given Rocephin 250 mg IM right ventrogluteal without complications. Pt to return tomorrow for next injection.

## 2016-09-10 ENCOUNTER — Ambulatory Visit (INDEPENDENT_AMBULATORY_CARE_PROVIDER_SITE_OTHER): Payer: Medicaid Other | Admitting: *Deleted

## 2016-09-10 ENCOUNTER — Encounter: Payer: Self-pay | Admitting: *Deleted

## 2016-09-10 DIAGNOSIS — N390 Urinary tract infection, site not specified: Secondary | ICD-10-CM | POA: Diagnosis not present

## 2016-09-10 MED ORDER — CEFTRIAXONE SODIUM 250 MG IJ SOLR
250.0000 mg | Freq: Once | INTRAMUSCULAR | Status: AC
  Administered 2016-09-10: 250 mg via INTRAMUSCULAR

## 2016-09-10 NOTE — Progress Notes (Signed)
Pt given Rocephin  IM left ventrogluteal without complications. Instructed to return tomorrow for next injection. Pt verbalized understanding.

## 2016-09-11 ENCOUNTER — Ambulatory Visit (INDEPENDENT_AMBULATORY_CARE_PROVIDER_SITE_OTHER): Payer: Medicaid Other | Admitting: Advanced Practice Midwife

## 2016-09-11 ENCOUNTER — Encounter: Payer: Self-pay | Admitting: Advanced Practice Midwife

## 2016-09-11 VITALS — BP 120/80 | HR 88 | Wt 251.0 lb

## 2016-09-11 DIAGNOSIS — Z1389 Encounter for screening for other disorder: Secondary | ICD-10-CM

## 2016-09-11 DIAGNOSIS — O09219 Supervision of pregnancy with history of pre-term labor, unspecified trimester: Secondary | ICD-10-CM

## 2016-09-11 DIAGNOSIS — Z3482 Encounter for supervision of other normal pregnancy, second trimester: Secondary | ICD-10-CM

## 2016-09-11 DIAGNOSIS — O2342 Unspecified infection of urinary tract in pregnancy, second trimester: Secondary | ICD-10-CM

## 2016-09-11 DIAGNOSIS — O09899 Supervision of other high risk pregnancies, unspecified trimester: Secondary | ICD-10-CM

## 2016-09-11 DIAGNOSIS — Z331 Pregnant state, incidental: Secondary | ICD-10-CM

## 2016-09-11 DIAGNOSIS — O2301 Infections of kidney in pregnancy, first trimester: Secondary | ICD-10-CM

## 2016-09-11 LAB — POCT URINALYSIS DIPSTICK
Blood, UA: NEGATIVE
Glucose, UA: NEGATIVE
KETONES UA: NEGATIVE
Leukocytes, UA: NEGATIVE
Nitrite, UA: NEGATIVE

## 2016-09-11 MED ORDER — CEFTRIAXONE SODIUM 250 MG IJ SOLR
250.0000 mg | Freq: Once | INTRAMUSCULAR | Status: AC
  Administered 2016-09-11: 250 mg via INTRAMUSCULAR

## 2016-09-11 MED ORDER — CEFTRIAXONE SODIUM 500 MG IJ SOLR: 250.0000 mg | Freq: Once | INTRAMUSCULAR | Status: DC

## 2016-09-11 MED ORDER — HYDROXYPROGESTERONE CAPROATE 250 MG/ML IM OIL
250.0000 mg | TOPICAL_OIL | Freq: Once | INTRAMUSCULAR | Status: AC
  Administered 2016-09-11: 250 mg via INTRAMUSCULAR

## 2016-09-11 NOTE — Patient Instructions (Signed)

## 2016-09-11 NOTE — Addendum Note (Signed)
Addended by: Federico Flake A on: 09/11/2016 02:59 PM   Modules accepted: Orders

## 2016-09-11 NOTE — Addendum Note (Signed)
Addended by: Federico Flake A on: 09/11/2016 02:43 PM   Modules accepted: Orders

## 2016-09-11 NOTE — Addendum Note (Signed)
Addended by: Jacklyn Shell on: 09/11/2016 03:01 PM   Modules accepted: Orders

## 2016-09-11 NOTE — Progress Notes (Signed)
[id number] [redacted]w[redacted]d Estimated Date of Delivery: 01/04/17  Last menstrual period 03/25/2016.   BP weight and urine results all reviewed and noted. Renal US normal.  Getting 5 days of Rocephin  d/t persistent e coli.  Will continue w/nightly macrobid suppression.  Pt reports urine odor and discoloration were gone after the first shot.  Also noticed a fine itchy r? Reaction to rocephin  Please refer to the obstetrical flow sheet for the fundal height and fetal heart rate documentation:  Patient reports good fetal movement, denies any bleeding and no rupture of membranes symptoms or regular contractions. Patient is without complaints. All questions were answered.  No orders of the defined types were placed in this encounter.   Plan:  Continued routine obstetrical care, 17p today; will resume to keflex for suppression (pt thinks that had worked better than the macrobid in the past).   Return for 17P Weekly; 4 weeks for LROB/PN2.

## 2016-09-12 ENCOUNTER — Ambulatory Visit (INDEPENDENT_AMBULATORY_CARE_PROVIDER_SITE_OTHER): Payer: Medicaid Other | Admitting: *Deleted

## 2016-09-12 DIAGNOSIS — N390 Urinary tract infection, site not specified: Secondary | ICD-10-CM | POA: Diagnosis not present

## 2016-09-12 MED ORDER — CEFTRIAXONE SODIUM 250 MG IJ SOLR
250.0000 mg | Freq: Once | INTRAMUSCULAR | Status: AC
  Administered 2016-09-12: 250 mg via INTRAMUSCULAR

## 2016-09-12 NOTE — Progress Notes (Signed)
Pt given Rocephin 250 mg IM Left ventrogluteal without complications. Pt to return tomorrow for next rocephin injection. Pt verbalized understanding.

## 2016-09-13 ENCOUNTER — Ambulatory Visit (INDEPENDENT_AMBULATORY_CARE_PROVIDER_SITE_OTHER): Payer: Medicaid Other | Admitting: *Deleted

## 2016-09-13 DIAGNOSIS — O2301 Infections of kidney in pregnancy, first trimester: Secondary | ICD-10-CM

## 2016-09-13 MED ORDER — CEFTRIAXONE SODIUM 250 MG IJ SOLR
250.0000 mg | Freq: Once | INTRAMUSCULAR | Status: AC
  Administered 2016-09-13: 250 mg via INTRAMUSCULAR

## 2016-09-13 NOTE — Progress Notes (Signed)
Pt here for Rocephin 250 mg.  Injection given in Rt glut without complication.  Pt did state she now has yeast infection.  Advised pt to use Monistat 7 OTC and if no improvement tell provider at next Wednesdays OV, pt verbalized understanding.

## 2016-09-18 ENCOUNTER — Ambulatory Visit (INDEPENDENT_AMBULATORY_CARE_PROVIDER_SITE_OTHER): Payer: Medicaid Other | Admitting: *Deleted

## 2016-09-18 VITALS — BP 120/70 | HR 102 | Wt 252.0 lb

## 2016-09-18 DIAGNOSIS — O09212 Supervision of pregnancy with history of pre-term labor, second trimester: Secondary | ICD-10-CM

## 2016-09-18 DIAGNOSIS — Z8751 Personal history of pre-term labor: Secondary | ICD-10-CM

## 2016-09-18 DIAGNOSIS — Z1389 Encounter for screening for other disorder: Secondary | ICD-10-CM

## 2016-09-18 DIAGNOSIS — Z331 Pregnant state, incidental: Secondary | ICD-10-CM

## 2016-09-18 LAB — POCT URINALYSIS DIPSTICK
Glucose, UA: NEGATIVE
KETONES UA: NEGATIVE
Leukocytes, UA: NEGATIVE
Nitrite, UA: NEGATIVE
Protein, UA: NEGATIVE
RBC UA: NEGATIVE

## 2016-09-18 MED ORDER — HYDROXYPROGESTERONE CAPROATE 250 MG/ML IM OIL
250.0000 mg | TOPICAL_OIL | Freq: Once | INTRAMUSCULAR | Status: AC
  Administered 2016-09-18: 250 mg via INTRAMUSCULAR

## 2016-09-18 NOTE — Progress Notes (Signed)
Pt given Makena  IM right ventrogluteal without complications. Advised pt to return in 1 week for next Makena injection. Pt verbalized understanding.

## 2016-09-20 ENCOUNTER — Encounter (HOSPITAL_COMMUNITY): Payer: Self-pay

## 2016-09-20 ENCOUNTER — Inpatient Hospital Stay (HOSPITAL_COMMUNITY)
Admission: AD | Admit: 2016-09-20 | Discharge: 2016-09-20 | Disposition: A | Discharge status: Home / Self Care | Payer: Medicaid Other | Source: Ambulatory Visit | Attending: Obstetrics and Gynecology | Admitting: Obstetrics and Gynecology

## 2016-09-20 DIAGNOSIS — O26892 Other specified pregnancy related conditions, second trimester: Secondary | ICD-10-CM | POA: Diagnosis not present

## 2016-09-20 DIAGNOSIS — Z87442 Personal history of urinary calculi: Secondary | ICD-10-CM | POA: Diagnosis not present

## 2016-09-20 DIAGNOSIS — O4702 False labor before 37 completed weeks of gestation, second trimester: Secondary | ICD-10-CM | POA: Insufficient documentation

## 2016-09-20 DIAGNOSIS — Z3A24 24 weeks gestation of pregnancy: Secondary | ICD-10-CM | POA: Diagnosis not present

## 2016-09-20 DIAGNOSIS — N898 Other specified noninflammatory disorders of vagina: Secondary | ICD-10-CM | POA: Diagnosis not present

## 2016-09-20 DIAGNOSIS — O09212 Supervision of pregnancy with history of pre-term labor, second trimester: Secondary | ICD-10-CM

## 2016-09-20 DIAGNOSIS — R109 Unspecified abdominal pain: Secondary | ICD-10-CM | POA: Diagnosis present

## 2016-09-20 LAB — URINALYSIS, ROUTINE W REFLEX MICROSCOPIC
BILIRUBIN URINE: NEGATIVE
GLUCOSE, UA: NEGATIVE mg/dL
HGB URINE DIPSTICK: NEGATIVE
Ketones, ur: NEGATIVE mg/dL
Leukocytes, UA: NEGATIVE
Nitrite: NEGATIVE
PROTEIN: NEGATIVE mg/dL
SPECIFIC GRAVITY, URINE: 1.01 (ref 1.005–1.030)
pH: 6 (ref 5.0–8.0)

## 2016-09-20 LAB — WET PREP, GENITAL
CLUE CELLS WET PREP: NONE SEEN
Sperm: NONE SEEN
Trich, Wet Prep: NONE SEEN
Yeast Wet Prep HPF POC: NONE SEEN

## 2016-09-20 LAB — FETAL FIBRONECTIN: Fetal Fibronectin: NEGATIVE

## 2016-09-20 NOTE — Discharge Instructions (Signed)
. °Preterm Labor and Birth Information °The normal length of a pregnancy is 39-41 weeks. Preterm labor is when labor starts before 37 completed weeks of pregnancy. °What are the risk factors for preterm labor? °Preterm labor is more likely to occur in women who: °· Have certain infections during pregnancy such as a bladder infection, sexually transmitted infection, or infection inside the uterus (chorioamnionitis). °· Have a shorter-than-normal cervix. °· Have gone into preterm labor before. °· Have had surgery on their cervix. °· Are younger than age 17 or older than age 35. °· Are African American. °· Are pregnant with twins or multiple babies (multiple gestation). °· Take street drugs or smoke while pregnant. °· Do not gain enough weight while pregnant. °· Became pregnant shortly after having been pregnant. °What are the symptoms of preterm labor? °Symptoms of preterm labor include: °· Cramps similar to those that can happen during a menstrual period. The cramps may happen with diarrhea. °· Pain in the abdomen or lower back. °· Regular uterine contractions that may feel like tightening of the abdomen. °· A feeling of increased pressure in the pelvis. °· Increased watery or bloody mucus discharge from the vagina. °· Water breaking (ruptured amniotic sac). °Why is it important to recognize signs of preterm labor? °It is important to recognize signs of preterm labor because babies who are born prematurely may not be fully developed. This can put them at an increased risk for: °· Long-term (chronic) heart and lung problems. °· Difficulty immediately after birth with regulating body systems, including blood sugar, body temperature, heart rate, and breathing rate. °· Bleeding in the brain. °· Cerebral palsy. °· Learning difficulties. °· Death. °These risks are highest for babies who are born before 34 weeks of pregnancy. °How is preterm labor treated? °Treatment depends on the length of your pregnancy, your condition,  and the health of your baby. It may involve: °· Having a stitch (suture) placed in your cervix to prevent your cervix from opening too early (cerclage). °· Taking or being given medicines, such as: °¨ Hormone medicines. These may be given early in pregnancy to help support the pregnancy. °¨ Medicine to stop contractions. °¨ Medicines to help mature the baby’s lungs. These may be prescribed if the risk of delivery is high. °¨ Medicines to prevent your baby from developing cerebral palsy. °If the labor happens before 34 weeks of pregnancy, you may need to stay in the hospital. °What should I do if I think I am in preterm labor? °If you think that you are going into preterm labor, call your health care provider right away. °How can I prevent preterm labor in future pregnancies? °To increase your chance of having a full-term pregnancy: °· Do not use any tobacco products, such as cigarettes, chewing tobacco, and e-cigarettes. If you need help quitting, ask your health care provider. °· Do not use street drugs or medicines that have not been prescribed to you during your pregnancy. °· Talk with your health care provider before taking any herbal supplements, even if you have been taking them regularly. °· Make sure you gain a healthy amount of weight during your pregnancy. °· Watch for infection. If you think that you might have an infection, get it checked right away. °· Make sure to tell your health care provider if you have gone into preterm labor before. °This information is not intended to replace advice given to you by your health care provider. Make sure you discuss any questions you have with your   health care provider. °Document Released: 08/03/2003 Document Revised: 10/24/2015 Document Reviewed: 10/04/2015 °Elsevier Interactive Patient Education © 2017 Elsevier Inc. ° °

## 2016-09-20 NOTE — MAU Note (Addendum)
Hx 30wk delivery last pregnancy. Last night my hips hurt and put it off on pregnancy. THis am had some watery d/c for which I had to wear panty liner. Lower back hurting since 1300. About 1500 started feeling like a band around lower back and abd. Feel heaviness in pelvis when walking but I'm ok when sitting. Leaking has continued off and on all day. Just finished treatment for pyelo last Friday and now on Keflex daily

## 2016-09-20 NOTE — MAU Provider Note (Signed)
CC:  Chief Complaint  Patient presents with  . Abdominal Cramping     First Provider Initiated Contact with Patient 09/20/16 2003      HPI: Audrey [name] is a 31 y.o. year old G4P2113 female at [redacted]w[redacted]d weeks gestation who presents to MAU reporting possible contractions since this afternoon and leaking white-ish yellow fluid x 2 since this morning. Feeling leakign a band wrapping around her low abd and back and increased pelvic pressure. Hx Spontaneous PTB at 30 weeks w/ previous pregnancy. On 17-P.   Associated Sx:  Vaginal bleeding: None Leaking of fluid: Possible Fetal movement: Nml  Past Medical History:  Diagnosis Date  . Allergy   . Anxiety   . BV (bacterial vaginosis) 12/30/2012  . Chicken pox   . Chronic kidney disease   . Contraceptive management 07/15/2013  . Hay fever   . History of kidney stones   . Irregular bleeding 12/30/2012  . Leukorrhea 10/07/2013   resolved s/p cryotherapy    . Migraines   . PONV (postoperative nausea and vomiting)   . Postcoital bleeding 05/13/2013   Had [place] and IUD in place, bleeding stopped after doxycycline but started back, has normal period lite x 2 days but bleeds every time with sex, is bright red and has clots and cramps after sex   . UTI (lower urinary tract infection) 08/24/2014  . UTI (urinary tract infection)    Obstetric History   G5   P3   T2   P1   A1   L3    SAB1   TAB0   Ectopic0   Multiple0   Live Births3     # Outcome Date GA Lbr Len/2nd Weight Sex Delivery Anes PTL Lv  5 Current           4 Preterm 08/07/08 [redacted]w[redacted]d  4 lb (1.814 kg) M Vag-Spont EPI Y LIV  3 Term 10/16/06 [redacted]w[redacted]d  7 lb 9 oz (3.43 kg) F Vag-Spont EPI N LIV  2 Term 03/25/05 [redacted]w[redacted]d  6 lb 14 oz (3.118 kg) F Vag-Spont EPI N LIV  1 SAB 2005             O:  Patient Vitals for the past 24 hrs:  BP Temp Pulse Resp SpO2 Height Weight  09/20/16 2103 - - (!) 107 - 98 % - -  09/20/16 2058 - - (!) 102 - 98 % - -  09/20/16 1931 - - (!) 110 - 100 % - -  09/20/16 1926  109/88 98.4 F (36.9 C) (!) 120 18 -  (1.651 m) 254 lb (115.2 kg)    General: NAD, anxious Heart: Mild tachycardia Lungs: Normal rate and effort Abd: Soft, NT, Gravid, S=D Pelvic: NEFG, Neg pooling, Neg  blood. Moderate amount of thin, creamy, white, odorless discharge.  Dilation: Closed Effacement (%): Thick Cervical Position: Posterior Station: Ballotable Presentation: Undeterminable Exam by:: Dorathy Kinsman, CNM  EFM: 145, reactive Toco: Rare, mild  A: [redacted]w[redacted]d week IUP Preterm contractions w/out evidence of active PTL. Neg fFN.  Leukorrhea. No evidence of ROM. FHR reactive  P: Discharge home in stable condition. Preterm labor precautions and fetal kick counts. GHC./Chlamydia cultures pending Follow-up as scheduled for prenatal visit or sooner as needed if symptoms worsen. Return to maternity admissions as needed if symptoms worsen.  [place], CNM 05/20/2016 11:40 PM  3

## 2016-09-23 LAB — GC/CHLAMYDIA PROBE AMP (~~LOC~~) NOT AT ARMC
Chlamydia: NEGATIVE
NEISSERIA GONORRHEA: NEGATIVE

## 2016-09-26 ENCOUNTER — Encounter: Payer: Self-pay | Admitting: *Deleted

## 2016-09-26 ENCOUNTER — Ambulatory Visit (INDEPENDENT_AMBULATORY_CARE_PROVIDER_SITE_OTHER): Payer: Medicaid Other | Admitting: *Deleted

## 2016-09-26 ENCOUNTER — Ambulatory Visit: Payer: Medicaid Other

## 2016-09-26 VITALS — BP 124/80 | HR 98 | Wt 252.0 lb

## 2016-09-26 DIAGNOSIS — O0912 Supervision of pregnancy with history of ectopic or molar pregnancy, second trimester: Secondary | ICD-10-CM

## 2016-09-26 DIAGNOSIS — Z331 Pregnant state, incidental: Secondary | ICD-10-CM

## 2016-09-26 DIAGNOSIS — R8290 Unspecified abnormal findings in urine: Secondary | ICD-10-CM

## 2016-09-26 DIAGNOSIS — Z1389 Encounter for screening for other disorder: Secondary | ICD-10-CM

## 2016-09-26 DIAGNOSIS — Z8751 Personal history of pre-term labor: Secondary | ICD-10-CM

## 2016-09-26 LAB — POCT URINALYSIS DIPSTICK
Blood, UA: NEGATIVE
Glucose, UA: NEGATIVE
Nitrite, UA: POSITIVE

## 2016-09-26 MED ORDER — HYDROXYPROGESTERONE CAPROATE 250 MG/ML IM OIL
250.0000 mg | TOPICAL_OIL | Freq: Once | INTRAMUSCULAR | Status: AC
  Administered 2016-09-26: 250 mg via INTRAMUSCULAR

## 2016-09-26 NOTE — Progress Notes (Signed)
Pt given Makena 250mg  IM right ventrogluteal without complications. Advised pt to return in 1 week for next injection. Pt verbalized understanding.

## 2016-09-28 LAB — URINE CULTURE

## 2016-10-01 ENCOUNTER — Telehealth: Payer: Self-pay | Admitting: Obstetrics & Gynecology

## 2016-10-01 ENCOUNTER — Other Ambulatory Visit: Payer: Self-pay | Admitting: Advanced Practice Midwife

## 2016-10-01 ENCOUNTER — Encounter: Payer: Self-pay | Admitting: Women's Health

## 2016-10-01 MED ORDER — HYDROXYZINE HCL 50 MG PO TABS: 50.0000 mg | ORAL_TABLET | Freq: Once | ORAL | 0 refills | Status: AC

## 2016-10-01 NOTE — Progress Notes (Signed)
Vistaril sent to p harmacy for anxiety about root canal

## 2016-10-01 NOTE — Telephone Encounter (Signed)
rx sent to pharmacy

## 2016-10-01 NOTE — Telephone Encounter (Signed)
Informed patient prescription was sent to pharmacy. Also patient to pick up dental release form later today.

## 2016-10-01 NOTE — Telephone Encounter (Signed)
Pt called stating that she is needing to have a root canal done tomorrow and she is having anxitey about it, pt would like to know if there is anything we could prescribe her for that, Pt is pregnant. Please contact pt

## 2016-10-02 ENCOUNTER — Other Ambulatory Visit: Payer: Self-pay | Admitting: Obstetrics & Gynecology

## 2016-10-02 MED ORDER — NITROFURANTOIN MONOHYD MACRO 100 MG PO CAPS: 100.0000 mg | ORAL_CAPSULE | Freq: Two times a day (BID) | ORAL | 0 refills | Status: DC

## 2016-10-04 ENCOUNTER — Ambulatory Visit (INDEPENDENT_AMBULATORY_CARE_PROVIDER_SITE_OTHER): Payer: Medicaid Other

## 2016-10-04 VITALS — Wt 255.0 lb

## 2016-10-04 DIAGNOSIS — O09892 Supervision of other high risk pregnancies, second trimester: Secondary | ICD-10-CM

## 2016-10-04 DIAGNOSIS — O09212 Supervision of pregnancy with history of pre-term labor, second trimester: Secondary | ICD-10-CM | POA: Diagnosis not present

## 2016-10-04 MED ORDER — HYDROXYPROGESTERONE CAPROATE 250 MG/ML IM OIL
250.0000 mg | TOPICAL_OIL | Freq: Once | INTRAMUSCULAR | Status: AC
  Administered 2016-10-04: 250 mg via INTRAMUSCULAR

## 2016-10-04 NOTE — Progress Notes (Signed)
PT here for 17 P 250 mg IM given RT venttogluteal. Tolerated well.Return 1 week for next shot. Unable to given in LT ventrogluteal, having pain in hip. Pad CMA

## 2016-10-14 ENCOUNTER — Ambulatory Visit: Payer: Medicaid Other

## 2016-10-16 ENCOUNTER — Other Ambulatory Visit: Payer: Medicaid Other

## 2016-10-16 ENCOUNTER — Encounter: Payer: Self-pay | Admitting: Adult Health

## 2016-10-16 ENCOUNTER — Encounter: Payer: Medicaid Other | Admitting: Obstetrics and Gynecology

## 2016-10-17 ENCOUNTER — Ambulatory Visit (INDEPENDENT_AMBULATORY_CARE_PROVIDER_SITE_OTHER): Payer: Medicaid Other | Admitting: Obstetrics and Gynecology

## 2016-10-17 ENCOUNTER — Encounter: Payer: Self-pay | Admitting: Obstetrics and Gynecology

## 2016-10-17 VITALS — BP 124/82 | HR 82 | Wt 252.6 lb

## 2016-10-17 DIAGNOSIS — Z1389 Encounter for screening for other disorder: Secondary | ICD-10-CM

## 2016-10-17 DIAGNOSIS — Z3480 Encounter for supervision of other normal pregnancy, unspecified trimester: Secondary | ICD-10-CM

## 2016-10-17 DIAGNOSIS — O09899 Supervision of other high risk pregnancies, unspecified trimester: Secondary | ICD-10-CM

## 2016-10-17 DIAGNOSIS — O09213 Supervision of pregnancy with history of pre-term labor, third trimester: Secondary | ICD-10-CM | POA: Diagnosis not present

## 2016-10-17 DIAGNOSIS — Z331 Pregnant state, incidental: Secondary | ICD-10-CM

## 2016-10-17 DIAGNOSIS — Z3483 Encounter for supervision of other normal pregnancy, third trimester: Secondary | ICD-10-CM

## 2016-10-17 DIAGNOSIS — O09219 Supervision of pregnancy with history of pre-term labor, unspecified trimester: Secondary | ICD-10-CM

## 2016-10-17 LAB — POCT URINALYSIS DIPSTICK
Blood, UA: NEGATIVE
GLUCOSE UA: NEGATIVE
KETONES UA: NEGATIVE
LEUKOCYTES UA: NEGATIVE
Nitrite, UA: POSITIVE
Protein, UA: NEGATIVE

## 2016-10-17 MED ORDER — HYDROXYPROGESTERONE CAPROATE 250 MG/ML IM OIL
250.0000 mg | TOPICAL_OIL | Freq: Once | INTRAMUSCULAR | Status: AC
  Administered 2016-10-17: 250 mg via INTRAMUSCULAR

## 2016-10-17 NOTE — Progress Notes (Signed)
[id number]  [facility]  Chief Complaint  Patient presents with  . Routine Prenatal Visit    hip pain, hemorrhoids  ____  Patient complaints:none, desires info on sterilization and LARC. Patient reports   good fetal movement,                           denies any bleeding , rupture of membranes,or regular contractions.  Blood pressure 124/82, pulse 82, weight 114.6 kg (252 lb 9.6 oz), last menstrual period 03/25/2016.   Urine results:notable for negative refer to the ob flow sheet for FH and FHR, ,                          Physical Examination: General appearance - alert, well appearing, and in no distress                                      Abdomen - FH 31                                                        -FHR 180                                                         soft, nontender, nondistended, no masses or organomegaly                                      Pelvic -                                             Questions were answered. Assessment: LROB [id number] @ 9082w5d , LROB                       Considering permanent sterilization, vas vs tubal  Plan:  Continued routine obstetrical care, options reviewed in detail  F/u in 4 weeks for LROB

## 2016-10-18 ENCOUNTER — Telehealth: Payer: Self-pay | Admitting: *Deleted

## 2016-10-18 MED ORDER — HYDROCORTISONE 2.5 % RE CREA: 1.0000 "application " | TOPICAL_CREAM | Freq: Two times a day (BID) | RECTAL | 0 refills | Status: DC

## 2016-10-18 NOTE — Addendum Note (Signed)
Addended by: Cheral MarkerBOOKER, Graclynn Vanantwerp R on: 10/18/2016 12:18 PM   Modules accepted: Orders

## 2016-10-18 NOTE — Progress Notes (Signed)
Advised pt prescription for hemorrhoids had been sent in.

## 2016-10-18 NOTE — Telephone Encounter (Signed)
Pt called regarding a prescription that Dr Emelda FearFerguson was going to prescribe her for hemorrhoids. Will speak with another provider.

## 2016-10-22 ENCOUNTER — Encounter: Payer: Self-pay | Admitting: Advanced Practice Midwife

## 2016-10-22 ENCOUNTER — Other Ambulatory Visit: Payer: Medicaid Other

## 2016-10-22 ENCOUNTER — Ambulatory Visit (INDEPENDENT_AMBULATORY_CARE_PROVIDER_SITE_OTHER): Payer: Medicaid Other | Admitting: Advanced Practice Midwife

## 2016-10-22 VITALS — BP 120/80 | HR 76 | Wt 253.0 lb

## 2016-10-22 DIAGNOSIS — N342 Other urethritis: Secondary | ICD-10-CM

## 2016-10-22 DIAGNOSIS — Z3A29 29 weeks gestation of pregnancy: Secondary | ICD-10-CM

## 2016-10-22 DIAGNOSIS — Z131 Encounter for screening for diabetes mellitus: Secondary | ICD-10-CM

## 2016-10-22 DIAGNOSIS — Z1389 Encounter for screening for other disorder: Secondary | ICD-10-CM

## 2016-10-22 DIAGNOSIS — Z331 Pregnant state, incidental: Secondary | ICD-10-CM

## 2016-10-22 DIAGNOSIS — Z3483 Encounter for supervision of other normal pregnancy, third trimester: Secondary | ICD-10-CM

## 2016-10-22 DIAGNOSIS — Z3482 Encounter for supervision of other normal pregnancy, second trimester: Secondary | ICD-10-CM

## 2016-10-22 LAB — POCT URINALYSIS DIPSTICK
Glucose, UA: NEGATIVE
KETONES UA: NEGATIVE
Leukocytes, UA: NEGATIVE
RBC UA: NEGATIVE

## 2016-10-22 MED ORDER — NITROFURANTOIN MONOHYD MACRO 100 MG PO CAPS: 100.0000 mg | ORAL_CAPSULE | Freq: Every day | ORAL | 4 refills | Status: DC

## 2016-10-22 NOTE — Progress Notes (Signed)
[id number] 7786w3d Estimated Date of Delivery: 01/04/17  Blood pressure 120/80, pulse 76, weight 253 lb (114.8 kg), last menstrual period 03/25/2016.   BP weight and urine results all reviewed and noted. Still has nitrates (was tx'd w/culture appropriate ABX 5/9).  Discussed w/LHE.  Will await cx results.   Please refer to the obstetrical flow sheet for the fundal height and fetal heart rate documentation:  Patient reports good fetal movement, denies any bleeding and no rupture of membranes symptoms or regular contractions. Patient is without complaints.  No UTI sx All questions were answered.  Orders Placed This Encounter  Procedures  . Urine culture  . POCT urinalysis dipstick    Plan:  Continued routine obstetrical care. Urine positive for nitrites. Awaiting urine culture results, continue Macrobid suppression.  Return for 17P Weekly.   CRESENZO-DISHMAN,FRANCES

## 2016-10-23 ENCOUNTER — Ambulatory Visit (INDEPENDENT_AMBULATORY_CARE_PROVIDER_SITE_OTHER): Payer: Medicaid Other | Admitting: *Deleted

## 2016-10-23 ENCOUNTER — Encounter: Payer: Self-pay | Admitting: *Deleted

## 2016-10-23 VITALS — BP 100/68 | HR 100 | Wt 255.0 lb

## 2016-10-23 DIAGNOSIS — Z1389 Encounter for screening for other disorder: Secondary | ICD-10-CM

## 2016-10-23 DIAGNOSIS — Z8751 Personal history of pre-term labor: Secondary | ICD-10-CM

## 2016-10-23 DIAGNOSIS — Z331 Pregnant state, incidental: Secondary | ICD-10-CM

## 2016-10-23 LAB — GLUCOSE TOLERANCE, 2 HOURS W/ 1HR
GLUCOSE, 1 HOUR: 88 mg/dL (ref 65–179)
Glucose, 2 hour: 78 mg/dL (ref 65–152)
Glucose, Fasting: 82 mg/dL (ref 65–91)

## 2016-10-23 LAB — CBC
HEMOGLOBIN: 12.6 g/dL (ref 11.1–15.9)
Hematocrit: 37.5 % (ref 34.0–46.6)
MCH: 30 pg (ref 26.6–33.0)
MCHC: 33.6 g/dL (ref 31.5–35.7)
MCV: 89 fL (ref 79–97)
Platelets: 350 10*3/uL (ref 150–379)
RBC: 4.2 x10E6/uL (ref 3.77–5.28)
RDW: 13.2 % (ref 12.3–15.4)
WBC: 7.4 10*3/uL (ref 3.4–10.8)

## 2016-10-23 LAB — ANTIBODY SCREEN: Antibody Screen: NEGATIVE

## 2016-10-23 LAB — HIV ANTIBODY (ROUTINE TESTING W REFLEX): HIV Screen 4th Generation wRfx: NONREACTIVE

## 2016-10-23 LAB — RPR: RPR: NONREACTIVE

## 2016-10-23 MED ORDER — HYDROXYPROGESTERONE CAPROATE 275 MG/1.1ML ~~LOC~~ SOAJ: 275.0000 mg | Freq: Once | SUBCUTANEOUS | Status: DC

## 2016-10-23 MED ORDER — HYDROXYPROGESTERONE CAPROATE 250 MG/ML IM OIL
250.0000 mg | TOPICAL_OIL | Freq: Once | INTRAMUSCULAR | Status: AC
  Administered 2016-10-23: 250 mg via INTRAMUSCULAR

## 2016-10-23 NOTE — Progress Notes (Addendum)
Pt unable to void at this visit  Pt given Makena 250mg  IM left ventrogluteal without complications. Advised pt to return in 1 week for next injection. Pt verbalized understanding.

## 2016-10-24 LAB — URINE CULTURE

## 2016-10-25 ENCOUNTER — Encounter: Payer: Self-pay | Admitting: Advanced Practice Midwife

## 2016-10-30 ENCOUNTER — Ambulatory Visit (INDEPENDENT_AMBULATORY_CARE_PROVIDER_SITE_OTHER): Payer: Medicaid Other | Admitting: *Deleted

## 2016-10-30 ENCOUNTER — Encounter: Payer: Self-pay | Admitting: *Deleted

## 2016-10-30 ENCOUNTER — Encounter: Payer: Self-pay | Admitting: Advanced Practice Midwife

## 2016-10-30 VITALS — BP 108/78 | HR 68 | Ht 63.5 in | Wt 254.0 lb

## 2016-10-30 DIAGNOSIS — O09213 Supervision of pregnancy with history of pre-term labor, third trimester: Secondary | ICD-10-CM | POA: Diagnosis not present

## 2016-10-30 DIAGNOSIS — O09893 Supervision of other high risk pregnancies, third trimester: Secondary | ICD-10-CM

## 2016-10-30 DIAGNOSIS — Z331 Pregnant state, incidental: Secondary | ICD-10-CM

## 2016-10-30 DIAGNOSIS — Z1389 Encounter for screening for other disorder: Secondary | ICD-10-CM

## 2016-10-30 LAB — POCT URINALYSIS DIPSTICK
Glucose, UA: NEGATIVE
NITRITE UA: POSITIVE
PROTEIN UA: NEGATIVE

## 2016-10-30 MED ORDER — HYDROXYPROGESTERONE CAPROATE 250 MG/ML IM OIL
250.0000 mg | TOPICAL_OIL | Freq: Once | INTRAMUSCULAR | Status: AC
  Administered 2016-10-30: 250 mg via INTRAMUSCULAR

## 2016-10-30 NOTE — Progress Notes (Signed)
Pt here for 17P. Pt tolerated shot well. Return in 1 week for next shot. Pt states she develops a rash after 17P. Drenda FreezeFran, CNM advised to try Benadryl or Pepcid. Pt voiced understanding. [name]

## 2016-10-31 ENCOUNTER — Encounter: Payer: Self-pay | Admitting: Obstetrics & Gynecology

## 2016-10-31 ENCOUNTER — Other Ambulatory Visit: Payer: Self-pay | Admitting: Obstetrics & Gynecology

## 2016-10-31 MED ORDER — CEPHALEXIN 500 MG PO CAPS: 500.0000 mg | ORAL_CAPSULE | Freq: Four times a day (QID) | ORAL | 0 refills | Status: DC

## 2016-11-05 ENCOUNTER — Telehealth: Payer: Self-pay | Admitting: *Deleted

## 2016-11-05 NOTE — Telephone Encounter (Signed)
See my chart message

## 2016-11-05 NOTE — Telephone Encounter (Signed)
Patient called but states she sent you a mychart message. She is not feeling well and states she "just doesn't feel like herself". She also states she is having lower pain and abdominal cramping. Fever highest to 99.2. Please advise, call or mychart message patient.

## 2016-11-06 ENCOUNTER — Ambulatory Visit: Payer: Medicaid Other

## 2016-11-06 ENCOUNTER — Ambulatory Visit (INDEPENDENT_AMBULATORY_CARE_PROVIDER_SITE_OTHER): Payer: Medicaid Other | Admitting: Advanced Practice Midwife

## 2016-11-06 ENCOUNTER — Encounter: Payer: Self-pay | Admitting: Advanced Practice Midwife

## 2016-11-06 ENCOUNTER — Encounter (HOSPITAL_COMMUNITY)
Admission: RE | Admit: 2016-11-06 | Discharge: 2016-11-06 | Disposition: A | Discharge status: Home / Self Care | Payer: Medicaid Other | Source: Ambulatory Visit | Attending: Obstetrics & Gynecology | Admitting: Obstetrics & Gynecology

## 2016-11-06 VITALS — BP 120/76 | HR 68 | Temp 98.5°F | Wt 256.0 lb

## 2016-11-06 DIAGNOSIS — N12 Tubulo-interstitial nephritis, not specified as acute or chronic: Secondary | ICD-10-CM

## 2016-11-06 DIAGNOSIS — O09213 Supervision of pregnancy with history of pre-term labor, third trimester: Secondary | ICD-10-CM | POA: Diagnosis not present

## 2016-11-06 DIAGNOSIS — Z1389 Encounter for screening for other disorder: Secondary | ICD-10-CM

## 2016-11-06 DIAGNOSIS — Z331 Pregnant state, incidental: Secondary | ICD-10-CM

## 2016-11-06 DIAGNOSIS — N390 Urinary tract infection, site not specified: Secondary | ICD-10-CM

## 2016-11-06 DIAGNOSIS — O2303 Infections of kidney in pregnancy, third trimester: Secondary | ICD-10-CM | POA: Diagnosis not present

## 2016-11-06 DIAGNOSIS — Z3A31 31 weeks gestation of pregnancy: Secondary | ICD-10-CM | POA: Diagnosis not present

## 2016-11-06 DIAGNOSIS — O09899 Supervision of other high risk pregnancies, unspecified trimester: Secondary | ICD-10-CM

## 2016-11-06 DIAGNOSIS — O09219 Supervision of pregnancy with history of pre-term labor, unspecified trimester: Principal | ICD-10-CM

## 2016-11-06 DIAGNOSIS — Z3483 Encounter for supervision of other normal pregnancy, third trimester: Secondary | ICD-10-CM

## 2016-11-06 LAB — POCT URINALYSIS DIPSTICK
GLUCOSE UA: NEGATIVE
KETONES UA: NEGATIVE
Leukocytes, UA: NEGATIVE
Nitrite, UA: POSITIVE
Protein, UA: NEGATIVE
RBC UA: NEGATIVE

## 2016-11-06 MED ORDER — DEXTROSE 5 % IV SOLN
2.0000 g | Freq: Once | INTRAVENOUS | Status: AC
  Administered 2016-11-06: 2 g via INTRAVENOUS

## 2016-11-06 MED ORDER — DEXTROSE 5 % IV SOLN
INTRAVENOUS | Status: AC
  Filled 2016-11-06: qty 2

## 2016-11-06 MED ORDER — HYDROXYPROGESTERONE CAPROATE 250 MG/ML IM OIL
250.0000 mg | TOPICAL_OIL | Freq: Once | INTRAMUSCULAR | Status: AC
  Administered 2016-11-06: 250 mg via INTRAMUSCULAR

## 2016-11-06 MED ORDER — SODIUM CHLORIDE 0.9 % IV SOLN
INTRAVENOUS | Status: DC
  Administered 2016-11-06: 250 mL via INTRAVENOUS

## 2016-11-06 NOTE — Progress Notes (Signed)
IV remains in place to left forearm and saline locked. Stockinett placed over IV site. Tolerated well.

## 2016-11-06 NOTE — Progress Notes (Signed)
[id number] 3136w4d Estimated Date of Delivery: 01/04/17  Blood pressure 120/76, pulse 68, temperature 98.5 F (36.9 C), weight 256 lb (116.1 kg), last menstrual period 03/25/2016.   BP weight and urine results all reviewed and noted. Still has nitrites, doesn't feel any better Subjective   fevers but none objectively (Tmax 99.4).  + CVAT, L>R.    Patient reports good fetal movement, denies any bleeding and no rupture of membranes symptoms or regular contractions. All questions were answered.  Orders Placed This Encounter  Procedures  . POCT urinalysis dipstick    Plan:  Continued routine obstetrical care, Discussed w/LHE.  Will do outpt (per pt request) IV rocephin q 24 hrs X 3 days.    Return for monday w/LHE.

## 2016-11-07 ENCOUNTER — Encounter (HOSPITAL_COMMUNITY): Payer: Self-pay

## 2016-11-07 ENCOUNTER — Encounter (HOSPITAL_COMMUNITY)
Admission: RE | Admit: 2016-11-07 | Discharge: 2016-11-07 | Disposition: A | Discharge status: Home / Self Care | Payer: Medicaid Other | Source: Ambulatory Visit | Attending: Obstetrics & Gynecology | Admitting: Obstetrics & Gynecology

## 2016-11-07 DIAGNOSIS — O2303 Infections of kidney in pregnancy, third trimester: Secondary | ICD-10-CM | POA: Diagnosis not present

## 2016-11-07 MED ORDER — DEXTROSE 5 % IV SOLN
2.0000 g | Freq: Once | INTRAVENOUS | Status: AC
  Administered 2016-11-07: 2 g via INTRAVENOUS
  Filled 2016-11-07: qty 2

## 2016-11-07 MED ORDER — SODIUM CHLORIDE 0.9 % IV SOLN
INTRAVENOUS | Status: DC
  Administered 2016-11-07: 250 mL via INTRAVENOUS

## 2016-11-07 NOTE — Progress Notes (Signed)
Patient requesting IV to be discontinued today due to tegaderm causing itching.   Patient denied itching or rashes anywhere else.  IV site inspected with no rash or redness noted.  Site clean, dry, and intact with no bruising or swelling noted.

## 2016-11-08 ENCOUNTER — Encounter (HOSPITAL_COMMUNITY)
Admission: RE | Admit: 2016-11-08 | Discharge: 2016-11-08 | Disposition: A | Discharge status: Home / Self Care | Payer: Medicaid Other | Source: Ambulatory Visit | Attending: Obstetrics & Gynecology | Admitting: Obstetrics & Gynecology

## 2016-11-08 DIAGNOSIS — O2303 Infections of kidney in pregnancy, third trimester: Secondary | ICD-10-CM | POA: Diagnosis not present

## 2016-11-08 MED ORDER — CEFTRIAXONE SODIUM 2 G IJ SOLR
2.0000 g | INTRAMUSCULAR | Status: DC
  Administered 2016-11-08: 2 g via INTRAVENOUS
  Filled 2016-11-08: qty 2

## 2016-11-08 MED ORDER — SODIUM CHLORIDE 0.9 % IV SOLN
INTRAVENOUS | Status: DC
  Administered 2016-11-08: 250 mL via INTRAVENOUS

## 2016-11-11 ENCOUNTER — Encounter: Payer: Medicaid Other | Admitting: Obstetrics & Gynecology

## 2016-11-14 ENCOUNTER — Ambulatory Visit (INDEPENDENT_AMBULATORY_CARE_PROVIDER_SITE_OTHER): Payer: Medicaid Other | Admitting: Advanced Practice Midwife

## 2016-11-14 ENCOUNTER — Encounter: Payer: Self-pay | Admitting: Advanced Practice Midwife

## 2016-11-14 VITALS — BP 106/74 | HR 90 | Wt 255.0 lb

## 2016-11-14 DIAGNOSIS — O09899 Supervision of other high risk pregnancies, unspecified trimester: Secondary | ICD-10-CM

## 2016-11-14 DIAGNOSIS — Z331 Pregnant state, incidental: Secondary | ICD-10-CM

## 2016-11-14 DIAGNOSIS — O09219 Supervision of pregnancy with history of pre-term labor, unspecified trimester: Secondary | ICD-10-CM

## 2016-11-14 DIAGNOSIS — Z3483 Encounter for supervision of other normal pregnancy, third trimester: Secondary | ICD-10-CM

## 2016-11-14 DIAGNOSIS — Z1389 Encounter for screening for other disorder: Secondary | ICD-10-CM

## 2016-11-14 LAB — POCT URINALYSIS DIPSTICK
Blood, UA: NEGATIVE
GLUCOSE UA: NEGATIVE
Ketones, UA: NEGATIVE
Leukocytes, UA: NEGATIVE
NITRITE UA: NEGATIVE

## 2016-11-14 MED ORDER — HYDROXYPROGESTERONE CAPROATE 250 MG/ML IM OIL
250.0000 mg | TOPICAL_OIL | Freq: Once | INTRAMUSCULAR | Status: AC
  Administered 2016-11-14: 250 mg via INTRAMUSCULAR

## 2016-11-14 NOTE — Progress Notes (Signed)
[id number] 997w5d Estimated Date of Delivery: 01/04/17  Blood pressure 106/74, pulse 90, weight 255 lb (115.7 kg), last menstrual period 03/25/2016.   BP weight and urine results all reviewed and noted.No nitriite!   Please refer to the obstetrical flow sheet for the fundal height and fetal heart rate documentation:  Patient reports good fetal movement, denies any bleeding and no rupture of membranes symptoms or regular contractions. Patient is without complaints. All questions were answered.  Orders Placed This Encounter  Procedures  . POCT urinalysis dipstick    Plan:  Continued routine obstetrical care,   Return for 17P Weekly, 2 weeks for UTI.

## 2016-11-21 ENCOUNTER — Ambulatory Visit: Payer: Medicaid Other

## 2016-11-25 ENCOUNTER — Encounter: Payer: Self-pay | Admitting: *Deleted

## 2016-11-25 ENCOUNTER — Ambulatory Visit (INDEPENDENT_AMBULATORY_CARE_PROVIDER_SITE_OTHER): Payer: Medicaid Other | Admitting: *Deleted

## 2016-11-25 VITALS — BP 110/78 | HR 96 | Ht 65.0 in | Wt 258.0 lb

## 2016-11-25 DIAGNOSIS — Z331 Pregnant state, incidental: Secondary | ICD-10-CM | POA: Diagnosis not present

## 2016-11-25 DIAGNOSIS — Z87440 Personal history of urinary (tract) infections: Secondary | ICD-10-CM

## 2016-11-25 DIAGNOSIS — O09893 Supervision of other high risk pregnancies, third trimester: Secondary | ICD-10-CM

## 2016-11-25 DIAGNOSIS — Z1389 Encounter for screening for other disorder: Secondary | ICD-10-CM | POA: Diagnosis not present

## 2016-11-25 DIAGNOSIS — R350 Frequency of micturition: Secondary | ICD-10-CM

## 2016-11-25 DIAGNOSIS — O09213 Supervision of pregnancy with history of pre-term labor, third trimester: Secondary | ICD-10-CM

## 2016-11-25 DIAGNOSIS — Z3480 Encounter for supervision of other normal pregnancy, unspecified trimester: Secondary | ICD-10-CM

## 2016-11-25 LAB — POCT URINALYSIS DIPSTICK
GLUCOSE UA: NEGATIVE
Ketones, UA: NEGATIVE
LEUKOCYTES UA: NEGATIVE
NITRITE UA: POSITIVE
RBC UA: NEGATIVE

## 2016-11-25 MED ORDER — HYDROXYPROGESTERONE CAPROATE 250 MG/ML IM OIL
250.0000 mg | TOPICAL_OIL | Freq: Once | INTRAMUSCULAR | Status: AC
  Administered 2016-11-25: 250 mg via INTRAMUSCULAR

## 2016-11-25 NOTE — Progress Notes (Signed)
Pt here for 17P. Pt tolerated shot well. Pt has + nitrites in urine. I spoke with Dr. Despina HiddenEure and he advised to send urine off for culture. Urine sent to lab. Pt to return in 1 week for 17P. [name]

## 2016-11-28 ENCOUNTER — Encounter: Payer: Self-pay | Admitting: Obstetrics and Gynecology

## 2016-11-28 ENCOUNTER — Ambulatory Visit (INDEPENDENT_AMBULATORY_CARE_PROVIDER_SITE_OTHER): Payer: Medicaid Other | Admitting: Obstetrics and Gynecology

## 2016-11-28 VITALS — BP 110/80 | HR 137 | Wt 257.8 lb

## 2016-11-28 DIAGNOSIS — Z3480 Encounter for supervision of other normal pregnancy, unspecified trimester: Secondary | ICD-10-CM

## 2016-11-28 DIAGNOSIS — Z3483 Encounter for supervision of other normal pregnancy, third trimester: Secondary | ICD-10-CM

## 2016-11-28 DIAGNOSIS — Z331 Pregnant state, incidental: Secondary | ICD-10-CM

## 2016-11-28 DIAGNOSIS — Z3A34 34 weeks gestation of pregnancy: Secondary | ICD-10-CM

## 2016-11-28 DIAGNOSIS — Z1389 Encounter for screening for other disorder: Secondary | ICD-10-CM

## 2016-11-28 LAB — POCT URINALYSIS DIPSTICK
Glucose, UA: NEGATIVE
Ketones, UA: NEGATIVE
Leukocytes, UA: NEGATIVE
NITRITE UA: POSITIVE

## 2016-11-28 LAB — URINE CULTURE

## 2016-11-28 NOTE — Progress Notes (Addendum)
[id number]  Estimated Date of Delivery: 01/04/17 LROB 8154w5d  Chief Complaint  Patient presents with  . Routine Prenatal Visit    + cramping: + contractions  ____  Patient complaints: she has a UTI for which she is taking Keflex. Pt has a multiRESISTANT ORGANISM, has no sx at present. She feels better on Keflex even despite the probable resistance. Patient reports   good fetal movement,                           denies any bleeding , rupture of membranes,or regular contractions.  Blood pressure 110/80, pulse (!) 137, weight 257 lb 12.8 oz (116.9 kg), last menstrual period 03/25/2016.   Urine results:notable for trace blood, 1+ protein, positive nitrites  refer to the ob flow sheet for FH and FHR, ,                          Physical Examination: General appearance - alert, well appearing, and in no distress                                      Abdomen - FH 37 ,                                                         -FHR 181                                                         soft, nontender, nondistended, no masses or organomegaly                                      Pelvic - examination not indicated                                            Questions were answered. Assessment: LROB [id number] @ 3854w5d Estimated Date of Delivery: 01/04/17  Plan:  Continued routine obstetrical care, continue Keflex   F/u in 1 week for LROB   By signing my name below, I, Sonum Patel, attest that this documentation has been prepared under the direction and in the presence of [name], Jadia Capers V, MD. Electronically Signed: Sonum Patel, Neurosurgeoncribe. 11/28/16. 1:55 PM.  I personally performed the services described in this documentation, which was SCRIBED in my presence. The recorded information has been reviewed and considered accurate. It has been edited as necessary during review. [name],Kaeleb Emond V, MD

## 2016-12-03 ENCOUNTER — Telehealth: Payer: Self-pay | Admitting: Obstetrics & Gynecology

## 2016-12-03 NOTE — Telephone Encounter (Signed)
I called Audrey [name] and let her know I am waiting for a conversation with Dr Orvan Falconerampbell

## 2016-12-04 ENCOUNTER — Encounter (HOSPITAL_COMMUNITY): Payer: Self-pay | Admitting: *Deleted

## 2016-12-04 ENCOUNTER — Encounter: Payer: Medicaid Other | Admitting: Advanced Practice Midwife

## 2016-12-04 ENCOUNTER — Inpatient Hospital Stay (HOSPITAL_COMMUNITY)
Admission: AD | Admit: 2016-12-04 | Discharge: 2016-12-04 | DRG: 778 | Disposition: A | Discharge status: Home / Self Care | Payer: Medicaid Other | Source: Ambulatory Visit | Attending: Obstetrics and Gynecology | Admitting: Obstetrics and Gynecology

## 2016-12-04 DIAGNOSIS — Z3480 Encounter for supervision of other normal pregnancy, unspecified trimester: Secondary | ICD-10-CM

## 2016-12-04 DIAGNOSIS — Z880 Allergy status to penicillin: Secondary | ICD-10-CM

## 2016-12-04 DIAGNOSIS — Z3A35 35 weeks gestation of pregnancy: Secondary | ICD-10-CM | POA: Diagnosis not present

## 2016-12-04 LAB — URINALYSIS, ROUTINE W REFLEX MICROSCOPIC
BILIRUBIN URINE: NEGATIVE
Glucose, UA: 50 mg/dL — AB
HGB URINE DIPSTICK: NEGATIVE
Ketones, ur: 20 mg/dL — AB
NITRITE: POSITIVE — AB
PROTEIN: NEGATIVE mg/dL
Specific Gravity, Urine: 1.011 (ref 1.005–1.030)
pH: 5 (ref 5.0–8.0)

## 2016-12-04 LAB — TYPE AND SCREEN
ABO/RH(D): O POS
Antibody Screen: NEGATIVE

## 2016-12-04 MED ORDER — LACTATED RINGERS IV SOLN
INTRAVENOUS | Status: DC
  Administered 2016-12-04: 06:00:00 via INTRAVENOUS

## 2016-12-04 MED ORDER — CALCIUM CARBONATE ANTACID 500 MG PO CHEW: 2.0000 | CHEWABLE_TABLET | ORAL | Status: DC | PRN

## 2016-12-04 MED ORDER — BETAMETHASONE SOD PHOS & ACET 6 (3-3) MG/ML IJ SUSP
12.0000 mg | INTRAMUSCULAR | Status: DC
  Administered 2016-12-04: 12 mg via INTRAMUSCULAR
  Filled 2016-12-04: qty 2

## 2016-12-04 MED ORDER — PRENATAL MULTIVITAMIN CH: 1.0000 | ORAL_TABLET | Freq: Every day | ORAL | Status: DC

## 2016-12-04 MED ORDER — ZOLPIDEM TARTRATE 5 MG PO TABS: 5.0000 mg | ORAL_TABLET | Freq: Every evening | ORAL | Status: DC | PRN

## 2016-12-04 MED ORDER — DOCUSATE SODIUM 100 MG PO CAPS: 100.0000 mg | ORAL_CAPSULE | Freq: Every day | ORAL | Status: DC

## 2016-12-04 MED ORDER — CEFAZOLIN SODIUM-DEXTROSE 2-4 GM/100ML-% IV SOLN
2.0000 g | Freq: Once | INTRAVENOUS | Status: AC
  Administered 2016-12-04: 2 g via INTRAVENOUS
  Filled 2016-12-04: qty 100

## 2016-12-04 MED ORDER — CEFAZOLIN SODIUM-DEXTROSE 1-4 GM/50ML-% IV SOLN
1.0000 g | Freq: Three times a day (TID) | INTRAVENOUS | Status: DC
  Filled 2016-12-04: qty 50

## 2016-12-04 MED ORDER — NIFEDIPINE 10 MG PO CAPS: 10.0000 mg | ORAL_CAPSULE | Freq: Three times a day (TID) | ORAL | 0 refills | Status: DC

## 2016-12-04 MED ORDER — ONDANSETRON HCL 4 MG/2ML IJ SOLN
4.0000 mg | Freq: Once | INTRAMUSCULAR | Status: AC
  Administered 2016-12-04: 4 mg via INTRAVENOUS
  Filled 2016-12-04: qty 2

## 2016-12-04 MED ORDER — ACETAMINOPHEN 325 MG PO TABS: 650.0000 mg | ORAL_TABLET | ORAL | Status: DC | PRN

## 2016-12-04 NOTE — Anesthesia Pain Management Evaluation Note (Signed)
  CRNA Pain Management Visit Note  Patient: Audrey [name], 31 y.o., female  "Hello I am a member of the anesthesia team at [facility]. We have an anesthesia team available at all times to provide care throughout the hospital, including epidural management and anesthesia for C-section. I don't know your plan for the delivery whether it a natural birth, water birth, IV sedation, nitrous supplementation, doula or epidural, but we want to meet your pain goals."   1.Was your pain managed to your expectations on prior hospitalizations?   Yes   2.What is your expectation for pain management during this hospitalization?     Epidural and IV pain meds  3.How can we help you reach that goal? Be available  Record the patient's initial score and the patient's pain goal.   Pain: 7  Pain Goal: 5 [facility] wants you to be able to say your pain was always managed very well.  [facility] WorthMERRITT,Kaye Luoma 12/04/2016

## 2016-12-04 NOTE — Discharge Summary (Signed)
    OB Discharge Summary     Patient Name: Audrey [name] DOB: [birth year] MRN: [id number]  Date of admission: 12/04/2016 Delivering MD: This patient has no babies on file.  Date of discharge: 12/04/2016  Admitting diagnosis: 36 WEEKS CTX Intrauterine pregnancy: 7456w4d     Secondary diagnosis:  Active Problems:   Preterm labor  Additional problems: none     Discharge diagnosis: Preterm labor, undelivered                                                                                                Post partum procedures:n/a  Augmentation: n/a  Complications: None  Hospital course:  Admitted for preterm labor.  She was treated with IV hydration and monitoring. Over the night, her contractions diminished, so Dr Alysia PennaErvin recommended discharge home with PRN Procardia.  She received one dose of Betamethasone and [facility]   Strict PTL precautions reviewed  Physical exam  Vitals:   12/04/16 0624 12/04/16 0852 12/04/16 0950 12/04/16 0956  BP: 126/75   121/72  Pulse: 98   85  Resp: 18 18 16 18   Temp: 97.7 F (36.5 C)   98.2 F (36.8 C)  TempSrc: Oral   Oral  Weight:      Height:       General: alert, cooperative and no distress Lochia: Uterine Fundus: Incision:  DVT Evaluation: No evidence of DVT seen on physical exam. Labs: Lab Results  Component Value Date   WBC 7.4 10/22/2016   HGB 12.6 10/22/2016   HCT 37.5 10/22/2016   MCV 89 10/22/2016   PLT 350 10/22/2016   CMP Latest Ref Rng & Units 08/28/2016  Glucose 65 - 99 mg/dL 92  BUN 6 - 20 mg/dL 7  Creatinine 4.740.44 - 2.591.00 mg/dL 5.630.74  Sodium 875135 - 643145 mmol/L 137  Potassium 3.5 - 5.1 mmol/L 3.9  Chloride 101 - 111 mmol/L 106  CO2 22 - 32 mmol/L 24  Calcium 8.9 - 10.3 mg/dL 3.2(R8.7(L)  Total Protein 6.5 - 8.1 g/dL 6.6  Total Bilirubin 0.3 - 1.2 mg/dL 5.1(O0.2(L)  Alkaline Phos 38 - 126 U/L 53  AST 15 - 41 U/L 18  ALT 14 - 54 U/L 27    Discharge instruction: per After Visit Summary and  "Baby and Me Booklet".  After visit meds:  Procardia 10mg  every 6 hrs prn PTL  Diet: routine diet  Activity: Advance as tolerated. Pelvic rest for 6 weeks.   Outpatient follow up:Tomorrow 0915 at [facility] Follow up Appt:Future Appointments Date Time Provider Department Center  12/05/2016 9:15 AM [facility] NURSE TECH [facility]  12/11/2016 8:30 AM [name], John V, MD [facility]          12/04/2016 Wynelle BourgeoisMarie Kandas Oliveto, CNM

## 2016-12-04 NOTE — Discharge Instructions (Signed)

## 2016-12-04 NOTE — H&P (Signed)
History     CSN: [id number]  Arrival date and time: 12/04/16 0023   First Provider Initiated Contact with Patient 12/04/16 0055        Chief Complaint  Patient presents with  . Contractions   HPI Audrey [name] is a 31 y.o. [phone] at 5354w4d who presents complaining of contractions every 5 minutes since 2200. She rates the contractions a 10/10 when they come and has not tried anything for the pain. She denies any vaginal bleeding or leaking of fluid. She reports good fetal movement. She has a history of a preterm delivery at 30 weeks and has been receiving 17P in the office.   [facility]  Initiated Care at  7wks  FOB Lowry RamBrandon Chew  Dating By LMP, U/S 7wk  Pap Neg 07/2015  GC/CT Initial:    -/-            36+wks:  Genetic Screen NT/IT: neg  CF screen declined  Anatomic US Normal female  Flu vaccine   Tdap Recommended ~ 28wks  Glucose Screen  2 hr88/82/78  GBS   Feed Preference   Contraception   Circumcision   Childbirth Classes   Pediatrician             OB History    Gravida Para Term Preterm AB Living   5 3 2 1 1 3    SAB TAB Ectopic Multiple Live Births   1       3          Past Medical History:  Diagnosis Date  . Allergy   . Anxiety   . BV (bacterial vaginosis) 12/30/2012  . Chicken pox   . Chronic kidney disease   . Contraceptive management 07/15/2013  . Hay fever   . History of kidney stones   . Irregular bleeding 12/30/2012  . Leukorrhea 10/07/2013   resolved s/p cryotherapy    . Migraines   . PONV (postoperative nausea and vomiting)   . Postcoital bleeding 05/13/2013   Had US and IUD in place, bleeding stopped after doxycycline but started back, has normal period lite x 2 days but bleeds every time with sex, is bright red and has clots and cramps after sex   . UTI (lower urinary tract infection) 08/24/2014  . UTI (urinary tract infection)          Past Surgical History:  Procedure Laterality Date  . CHOLECYSTECTOMY N/A  03/09/2014   Procedure: LAPAROSCOPIC CHOLECYSTECTOMY;  Surgeon: Dalia HeadingMark A Jenkins, MD;  Location: [facility];  Service: General;  Laterality: N/A;  . DILATION AND CURETTAGE OF UTERUS    . TONSILLECTOMY           Family History  Problem Relation Age of Onset  . Arthritis Mother   . Lung cancer Mother   . Hyperlipidemia Mother   . Atrial fibrillation Mother   . Stroke Mother   . Hypertension Mother   . Diabetes Father   . Dementia Father   . Breast cancer Maternal Aunt   . Diabetes Paternal Uncle     Social History  Substance Use Topics  . Smoking status: Never Smoker  . Smokeless tobacco: Never Used  . Alcohol use No    Allergies:       Allergies  Allergen Reactions  . Bee Venom Shortness Of Breath  . Codeine Itching  . Penicillins Hives    Has patient had a PCN reaction causing immediate rash, facial/tongue/throat swelling, SOB or lightheadedness with hypotension: unknown  Has patient had a PCN reaction causing severe rash involving mucus membranes or skin necrosis:  Has patient had a PCN reaction that required hospitalization  Has patient had a PCN reaction occurring within the last 10 years:  If all of the above answers are "NO", then may proceed with Cephalosporin use.   . Bactrim [Sulfamethoxazole-Trimethoprim] Itching and Rash           Prescriptions Prior to Admission  Medication Sig Dispense Refill Last Dose  . cephALEXin (KEFLEX) 500 MG capsule Take 1 capsule (500 mg total) by mouth 4 (four) times daily. (Patient taking differently: Take 500 mg by mouth at bedtime. ) 40 capsule 0 Taking  . Pediatric Multiple Vit-C-FA (FLINSTONES GUMMIES OMEGA-3 DHA PO) Take by mouth. Takes 2 daily   Taking    Review of Systems  Constitutional: Negative.  Negative for chills and fever.  HENT: Negative.   Respiratory: Negative.  Negative for shortness of breath.   Cardiovascular: Negative.  Negative for chest pain.  Gastrointestinal: Positive for  abdominal pain. Negative for constipation, diarrhea, nausea and vomiting.  Genitourinary: Negative.  Negative for dysuria, vaginal bleeding and vaginal discharge.  Neurological: Negative.  Negative for dizziness and headaches.  Psychiatric/Behavioral: Negative.    Physical Exam   Blood pressure 128/79, pulse (!) 106, temperature 97.9 F (36.6 C), temperature source Oral, resp. rate 17, last menstrual period 03/25/2016.  Physical Exam  Nursing note and vitals reviewed. Constitutional: She appears well-developed and well-nourished.  HENT:  Head: Normocephalic and atraumatic.  Eyes: Conjunctivae are normal. No scleral icterus.  Cardiovascular: Normal rate, regular rhythm and normal heart sounds.   Respiratory: Effort normal and breath sounds normal. No respiratory distress.  GI: Soft. She exhibits no distension. There is no tenderness.  Neurological: She is alert.  Skin: Skin is warm and dry.  Psychiatric: She has a normal mood and affect. Her behavior is normal. Judgment and thought content normal.   Dilation: 2 Effacement (%): 60 Cervical Position: Posterior Station: -3 Exam by:: Cleone Slim, SNM  Fetal Tracing:  Baseline: 140bpm Variability: moderate Accelerations: 15x15 Decelerations: none  Toco: q 5-8 minutes  MAU Course   MDM UA Reactive NST Cervical change from closed to 2cm over 1.5hours BMZ GBS unknown, culture collected   Assessment and Plan   1. Preterm labor in third trimester without delivery   2. Encounter for supervision of other normal pregnancy, unspecified trimester    -Admit to birthing suites for observation for preterm labor -Expectant management -BMZ  -IV fluids -Ancef for GBS prophylaxis  Cleone Slim SNM 12/04/2016, 2:31 AM   I confirm that I have verified the information documented in the student nurse-midwife's note and that I have also personally performed the physical exam and all medical decision making  activities.  Sharen Counter, CNM 8:48 PM

## 2016-12-04 NOTE — MAU Note (Signed)
Pt started contracting around 2200 denies LOF/VB.

## 2016-12-05 ENCOUNTER — Ambulatory Visit (INDEPENDENT_AMBULATORY_CARE_PROVIDER_SITE_OTHER): Payer: Medicaid Other

## 2016-12-05 VITALS — Wt 259.0 lb

## 2016-12-05 DIAGNOSIS — O09213 Supervision of pregnancy with history of pre-term labor, third trimester: Secondary | ICD-10-CM | POA: Diagnosis not present

## 2016-12-05 DIAGNOSIS — O09893 Supervision of other high risk pregnancies, third trimester: Secondary | ICD-10-CM

## 2016-12-05 DIAGNOSIS — Z3A35 35 weeks gestation of pregnancy: Secondary | ICD-10-CM

## 2016-12-05 MED ORDER — BETAMETHASONE SOD PHOS & ACET 6 (3-3) MG/ML IJ SUSP
12.0000 mg | Freq: Once | INTRAMUSCULAR | Status: AC
  Administered 2016-12-05: 12 mg via INTRAMUSCULAR

## 2016-12-05 NOTE — Progress Notes (Signed)
PT here for betamethasone 12 mg IM  Given LT Ventrogluteal. Tolerated well. Return 1 week for next shot.pad CMA.

## 2016-12-06 LAB — CULTURE, BETA STREP (GROUP B ONLY)

## 2016-12-06 LAB — OB RESULTS CONSOLE GBS: STREP GROUP B AG: NEGATIVE

## 2016-12-09 ENCOUNTER — Ambulatory Visit (INDEPENDENT_AMBULATORY_CARE_PROVIDER_SITE_OTHER): Payer: Medicaid Other | Admitting: Obstetrics and Gynecology

## 2016-12-09 ENCOUNTER — Encounter: Payer: Self-pay | Admitting: Obstetrics and Gynecology

## 2016-12-09 DIAGNOSIS — Z331 Pregnant state, incidental: Secondary | ICD-10-CM

## 2016-12-09 DIAGNOSIS — Z1389 Encounter for screening for other disorder: Secondary | ICD-10-CM

## 2016-12-09 DIAGNOSIS — Z3483 Encounter for supervision of other normal pregnancy, third trimester: Secondary | ICD-10-CM

## 2016-12-09 DIAGNOSIS — Z3A36 36 weeks gestation of pregnancy: Secondary | ICD-10-CM

## 2016-12-09 DIAGNOSIS — Z87440 Personal history of urinary (tract) infections: Secondary | ICD-10-CM

## 2016-12-09 LAB — POCT URINALYSIS DIPSTICK
Blood, UA: NEGATIVE
GLUCOSE UA: NEGATIVE
KETONES UA: NEGATIVE
Leukocytes, UA: NEGATIVE

## 2016-12-09 NOTE — Telephone Encounter (Signed)
Patient called and stated she was bit by a spider this weekend. The area seems to be getting worse and has a "bullseye" are around it. States it itches in which she has tried cortisone cream but it is not helping. Will work in with Dr Emelda FearFerguson. Verbalized understanding.

## 2016-12-09 NOTE — Progress Notes (Addendum)
Audrey [name] is a 31 y.o. female [phone]  [facility]: [facility]  Chief Complaint  Patient presents with  . work-in-ob    spider bite RT inner thigh Friday night  ____  Patient complaints: Pt has a spider bite on her RT inner thigh, which occurred on Friday night. She also reports constant braxton hicks contractions. She has no other complaints today. Patient reports  good fetal movement,                           denies any bleeding , rupture of membranes,or regular contractions.  Blood pressure 110/70, pulse 76, temperature 98.6 F (37 C), weight 259 lb (117.5 kg), last menstrual period 03/25/2016.    Urine results:notable for trace protein, positive nitrates refer to the ob flow sheet for FH and FHR, ,                          Physical Examination: General appearance - alert, well appearing, and in no distress                                      Abdomen - FH 36cm ,                                                         -FHR 1171bpm                                                         soft, nontender, nondistended, no masses or organomegaly                                      Pelvic - not done                                            Questions were answered. Assessment: LROB [id number] @ 6313w2d Estimated Date of Delivery: 01/04/17                        Hx recurrent Uti, multiple drug resistance, will repeat urine culture.  Plan:  Continued routine obstetrical care, LROB  F/u in 1 week for LROB          Repeat urine culture.          Continue urine supression, Keflex has kept pt suppressed so there have been no episodes of pyelo,  tho cultures indicate resistance By signing my name below, I, Izna Ahmed, attest that this documentation has been prepared under the direction and in the presence of [name], Manar Smalling V, MD. Electronically Signed: Redge GainerIzna Ahmed, ED Scribe. 12/09/16. 11:51 AM.  I personally performed the services described in this documentation,  which was SCRIBED in my presence. The recorded information has been reviewed and considered  accurate. It has been edited as necessary during review. Jonnie Kind, MD

## 2016-12-11 ENCOUNTER — Encounter: Payer: Medicaid Other | Admitting: Obstetrics and Gynecology

## 2016-12-12 ENCOUNTER — Encounter: Payer: Medicaid Other | Admitting: Obstetrics & Gynecology

## 2016-12-12 LAB — URINE CULTURE

## 2016-12-16 ENCOUNTER — Encounter: Payer: Self-pay | Admitting: Obstetrics and Gynecology

## 2016-12-16 ENCOUNTER — Ambulatory Visit (INDEPENDENT_AMBULATORY_CARE_PROVIDER_SITE_OTHER): Payer: Medicaid Other | Admitting: Obstetrics and Gynecology

## 2016-12-16 VITALS — BP 120/82 | HR 121 | Wt 255.0 lb

## 2016-12-16 DIAGNOSIS — Z3480 Encounter for supervision of other normal pregnancy, unspecified trimester: Secondary | ICD-10-CM

## 2016-12-16 DIAGNOSIS — Z331 Pregnant state, incidental: Secondary | ICD-10-CM

## 2016-12-16 DIAGNOSIS — O26893 Other specified pregnancy related conditions, third trimester: Secondary | ICD-10-CM

## 2016-12-16 DIAGNOSIS — Z1389 Encounter for screening for other disorder: Secondary | ICD-10-CM

## 2016-12-16 DIAGNOSIS — Z3A37 37 weeks gestation of pregnancy: Secondary | ICD-10-CM

## 2016-12-16 LAB — POCT URINALYSIS DIPSTICK
Blood, UA: NEGATIVE
Glucose, UA: NEGATIVE
Ketones, UA: NEGATIVE
NITRITE UA: POSITIVE
Protein, UA: NEGATIVE

## 2016-12-16 MED ORDER — NITROFURANTOIN MACROCRYSTAL 100 MG PO CAPS: 100.0000 mg | ORAL_CAPSULE | Freq: Every day | ORAL | 1 refills | Status: DC

## 2016-12-16 NOTE — Progress Notes (Signed)
Audrey [name] is a 31 y.o. female [phone]  Estimated Date of Delivery: 01/04/17 LROB 6783w2d  No chief complaint on file. ____  Patient complains of constant back pain. Patient reports  good fetal movement,                           denies any bleeding , rupture of membranes,or regular contractions.  Last menstrual period 03/25/2016.   Urine results: 1+ leukocytes, positive nitrates refer to the ob flow sheet for FH and FHR, ,                          Physical Examination: General appearance - alert, well appearing, and in no distress                                      Abdomen - FH 40 cm                                                        -FHR 176bpm                                                         soft, nontender, nondistended, no masses or organomegaly                                      Pelvic - cervix checked, blocked lubrication cyst on cervix                                            Questions were answered. Assessment: LROB [id number] @ 5783w2d Estimated Date of Delivery: 01/04/17 Hx recurrent Uti, multiple drug resistance, will add Macro [place] 100 mg at bedtime to current Keflex suppression  Plan:  Continued routine obstetrical care, LROB, Continue Keflex  F/u in 1 week for LROB   By signing my name below, I, Izna Ahmed, attest that this documentation has been prepared under the direction and in the presence of [name], Fynlee Rowlands V., MD. Electronically Signed: Redge GainerIzna Ahmed, ED Scribe. 12/16/16. 10:32 AM.  I personally performed the services described in this documentation, which was SCRIBED in my presence. The recorded information has been reviewed and considered accurate. It has been edited as necessary during review. [name],Kasem Mozer V, MD

## 2016-12-19 ENCOUNTER — Telehealth: Payer: Self-pay | Admitting: *Deleted

## 2016-12-19 ENCOUNTER — Encounter: Payer: Self-pay | Admitting: *Deleted

## 2016-12-19 NOTE — Telephone Encounter (Signed)
Called patient but unable to leave message. Will send mychart message.

## 2016-12-23 ENCOUNTER — Encounter: Payer: Self-pay | Admitting: Women's Health

## 2016-12-23 ENCOUNTER — Ambulatory Visit (INDEPENDENT_AMBULATORY_CARE_PROVIDER_SITE_OTHER): Payer: Medicaid Other | Admitting: Women's Health

## 2016-12-23 VITALS — BP 112/78 | HR 76 | Wt 259.5 lb

## 2016-12-23 DIAGNOSIS — Z3A38 38 weeks gestation of pregnancy: Secondary | ICD-10-CM | POA: Diagnosis not present

## 2016-12-23 DIAGNOSIS — Z3483 Encounter for supervision of other normal pregnancy, third trimester: Secondary | ICD-10-CM

## 2016-12-23 DIAGNOSIS — Z1389 Encounter for screening for other disorder: Secondary | ICD-10-CM

## 2016-12-23 DIAGNOSIS — R110 Nausea: Secondary | ICD-10-CM

## 2016-12-23 DIAGNOSIS — Z331 Pregnant state, incidental: Secondary | ICD-10-CM

## 2016-12-23 DIAGNOSIS — O368130 Decreased fetal movements, third trimester, not applicable or unspecified: Secondary | ICD-10-CM | POA: Diagnosis not present

## 2016-12-23 LAB — POCT URINALYSIS DIPSTICK
Glucose, UA: NEGATIVE
Ketones, UA: NEGATIVE
Nitrite, UA: POSITIVE
Protein, UA: NEGATIVE

## 2016-12-23 NOTE — Patient Instructions (Signed)
Check your temperature around 4:30-5pm, if it is >100.4 go to [facility]. If normal, check it again around bedtime  Call the office [phone]) or [facility] to [facility] if:  You begin to have strong, frequent contractions  Your water breaks.  Sometimes it is a big gush of fluid, sometimes it is just a trickle that keeps getting your panties wet or running down your legs  You have vaginal bleeding.  It is normal to have a small amount of spotting if your cervix was checked.   You don't feel your baby moving like normal.  If you don't, get you something to eat and drink and lay down and focus on feeling your baby move.  You should feel at least 10 movements in 2 hours.  If you don't, you should call the office or [facility].     Fetal Movement Counts Patient Name: ________________________________________________ Patient Due Date: ____________________ What is a fetal movement count? A fetal movement count is the number of times that you feel your baby move during a certain amount of time. This may also be called a fetal kick count. A fetal movement count is recommended for every pregnant woman. You may be asked to start counting fetal movements as early as week 28 of your pregnancy. Pay attention to when your baby is most active. You may notice your baby's sleep and wake cycles. You may also notice things that make your baby move more. You should do a fetal movement count:  When your baby is normally most active.  At the same time each day.  A good time to count movements is while you are resting, after having something to eat and drink. How do I count fetal movements? 1. Find a quiet, comfortable area. Sit, or lie down on your side. 2. Write down the date, the start time and stop time, and the number of movements that you felt between those two times. Take this information with you to your health care visits. 3. For 2 hours, count kicks, flutters, swishes, rolls, and jabs.  You should feel at least 10 movements during 2 hours. 4. You may stop counting after you have felt 10 movements. 5. If you do not feel 10 movements in 2 hours, have something to eat and drink. Then, keep resting and counting for 1 hour. If you feel at least 4 movements during that hour, you may stop counting. Contact a health care provider if:  You feel fewer than 4 movements in 2 hours.  Your baby is not moving like he or she usually does. Date: ____________ Start time: ____________ Stop time: ____________ Movements: ____________ Date: ____________ Start time: ____________ Stop time: ____________ Movements: ____________ Date: ____________ Start time: ____________ Stop time: ____________ Movements: ____________ Date: ____________ Start time: ____________ Stop time: ____________ Movements: ____________ Date: ____________ Start time: ____________ Stop time: ____________ Movements: ____________ Date: ____________ Start time: ____________ Stop time: ____________ Movements: ____________ Date: ____________ Start time: ____________ Stop time: ____________ Movements: ____________ Date: ____________ Start time: ____________ Stop time: ____________ Movements: ____________ Date: ____________ Start time: ____________ Stop time: ____________ Movements: ____________ This information is not intended to replace advice given to you by your health care provider. Make sure you discuss any questions you have with your health care provider. Document Released: 06/12/2006 Document Revised: 01/10/2016 Document Reviewed: 06/22/2015 Elsevier Interactive Patient Education  2018 [employer]  [employer] CorporationBraxton Hicks Contractions Contractions of the uterus can occur throughout pregnancy, but they are not always a sign  that you are in labor. You may have practice contractions called Braxton Hicks contractions. These false labor contractions are sometimes confused with true labor. What are Deberah Pelton contractions? Braxton Hicks  contractions are tightening movements that occur in the muscles of the uterus before labor. Unlike true labor contractions, these contractions do not result in opening (dilation) and thinning of the cervix. Toward the end of pregnancy (32-34 weeks), Braxton Hicks contractions can happen more often and may become stronger. These contractions are sometimes difficult to tell apart from true labor because they can be very uncomfortable. You should not feel embarrassed if you go to the hospital with false labor. Sometimes, the only way to tell if you are in true labor is for your health care provider to look for changes in the cervix. The health care provider will do a physical exam and may monitor your contractions. If you are not in true labor, the exam should show that your cervix is not dilating and your water has not broken. If there are no prenatal problems or other health problems associated with your pregnancy, it is completely safe for you to be sent home with false labor. You may continue to have Braxton Hicks contractions until you go into true labor. How can I tell the difference between true labor and false labor?  Differences ? False labor ? Contractions last 30-70 seconds.: Contractions are usually shorter and not as strong as true labor contractions. ? Contractions become very regular.: Contractions are usually irregular. ? Discomfort is usually felt in the top of the uterus, and it spreads to the lower abdomen and low back.: Contractions are often felt in the front of the lower abdomen and in the groin. ? Contractions do not go away with walking.: Contractions may go away when you walk around or change positions while lying down. ? Contractions usually become more intense and increase in frequency.: Contractions get weaker and are shorter-lasting as time goes on. ? The cervix dilates and gets thinner.: The cervix usually does not dilate or become thin. Follow these instructions at  home:  Take over-the-counter and prescription medicines only as told by your health care provider.  Keep up with your usual exercises and follow other instructions from your health care provider.  Eat and drink lightly if you think you are going into labor.  If Braxton Hicks contractions are making you uncomfortable: ? Change your position from lying down or resting to walking, or change from walking to resting. ? Sit and rest in a tub of warm water. ? Drink enough fluid to keep your urine clear or pale yellow. Dehydration may cause these contractions. ? Do slow and deep breathing several times an hour.  Keep all follow-up prenatal visits as told by your health care provider. This is important. Contact a health care provider if:  You have a fever.  You have continuous pain in your abdomen. Get help right away if:  Your contractions become stronger, more regular, and closer together.  You have fluid leaking or gushing from your vagina.  You pass blood-tinged mucus (bloody show).  You have bleeding from your vagina.  You have low back pain that you never had before.  You feel your baby's head pushing down and causing pelvic pressure.  Your baby is not moving inside you as much as it used to. Summary  Contractions that occur before labor are called Braxton Hicks contractions, false labor, or practice contractions.  Braxton Hicks contractions are usually shorter, weaker,  farther apart, and less regular than true labor contractions. True labor contractions usually become progressively stronger and regular and they become more frequent.  Manage discomfort from [facility] contractions by changing position, resting in a warm bath, drinking plenty of water, or practicing deep breathing. This information is not intended to replace advice given to you by your health care provider. Make sure you discuss any questions you have with your health care provider. Document Released:  05/13/2005 Document Revised: 04/01/2016 Document Reviewed: 04/01/2016 Elsevier Interactive Patient Education  2017 [employer]

## 2016-12-23 NOTE — Progress Notes (Signed)
Low-risk OB appointment [id number] 330w2d Estimated Date of Delivery: 01/04/17 BP 112/78   Pulse 76   Wt 259 lb 8 oz (117.7 kg)   LMP 03/25/2016 (Exact Date)   BMI 43.18 kg/m   BP, weight, and urine reviewed.  Refer to obstetrical flow sheet for FH & FHR.  Reports decreased fm the last few days.  Denies regular uc's, lof, vb, or uti s/s. Just doesn't feel well today. States temp was 99.4 earlier today, took apap @ 1230. Some nausea, no vomiting. Some lower back pain, hard for her to tell if kidneys vs lower back/pregnancy related.  Temp here 98.5 Had pyelo 1st trimester, then has had persistent + E-coli urine cx the entire pregnancy, despite multiple meds per urine cx should have been sensitive to. Currently on Keflex and Macrobid. Send urine cx again today Slight bilateral tenderness to palpation- lower than normal CVA SVE per request: 1.5/th/-3, vtx Reactive NST w/ +FM perceived by pt while on monitor Reviewed labor s/s, fkc. To check temp at home around 1630-1700 (4hrs after apap), if >100.4 to go to [facility] for pyelo eval. If normal, check again around bedtime Plan:  Continue routine obstetrical care  F/U in 1wk for OB appointment

## 2016-12-25 LAB — GC/CHLAMYDIA PROBE AMP
CHLAMYDIA, DNA PROBE: NEGATIVE
NEISSERIA GONORRHOEAE BY PCR: NEGATIVE

## 2016-12-26 ENCOUNTER — Telehealth: Payer: Self-pay | Admitting: Women's Health

## 2016-12-26 DIAGNOSIS — O2343 Unspecified infection of urinary tract in pregnancy, third trimester: Secondary | ICD-10-CM

## 2016-12-26 MED ORDER — PROMETHAZINE HCL 25 MG PO TABS: 12.5000 mg | ORAL_TABLET | Freq: Four times a day (QID) | ORAL | 0 refills | Status: DC | PRN

## 2016-12-26 NOTE — Telephone Encounter (Signed)
Called pt, urine cx did not get ordered when I saw her the other day, asked if she could come today or tomorrow to give specimen, states she will. Reports vomiting and diarrhea x 1 day, wants rx for phenergan- rx sent. Instructed to go to [facility] if no foods/fluids staying down at all and getting dehydrated (not able to make spit, urine dark/concentrated).  Cheral MarkerKimberly R. Monte Bronder, CNM, WHNP-BC 12/26/2016 1:25 PM

## 2016-12-30 ENCOUNTER — Encounter: Payer: Self-pay | Admitting: Obstetrics and Gynecology

## 2016-12-30 ENCOUNTER — Ambulatory Visit (INDEPENDENT_AMBULATORY_CARE_PROVIDER_SITE_OTHER): Payer: Medicaid Other | Admitting: Obstetrics and Gynecology

## 2016-12-30 VITALS — BP 130/80 | HR 110 | Wt 262.8 lb

## 2016-12-30 DIAGNOSIS — Z3483 Encounter for supervision of other normal pregnancy, third trimester: Secondary | ICD-10-CM | POA: Diagnosis not present

## 2016-12-30 DIAGNOSIS — Z3A39 39 weeks gestation of pregnancy: Secondary | ICD-10-CM

## 2016-12-30 DIAGNOSIS — Z1389 Encounter for screening for other disorder: Secondary | ICD-10-CM

## 2016-12-30 DIAGNOSIS — Z331 Pregnant state, incidental: Secondary | ICD-10-CM

## 2016-12-30 LAB — POCT URINALYSIS DIPSTICK
Glucose, UA: NEGATIVE
Ketones, UA: NEGATIVE
NITRITE UA: POSITIVE

## 2016-12-30 NOTE — Progress Notes (Signed)
Audrey [name] is a 31 y.o. female [phone]  Estimated Date of Delivery: 01/04/17 LROB 3330w2d  Chief Complaint  Patient presents with  . Routine Prenatal Visit  ____  Patient has no complaints today. Patient reports  good fetal movement,                          denies any bleeding , rupture of membranes,or regular contractions.  Blood pressure 130/80, pulse (!) 110, weight 262 lb 12.8 oz (119.2 kg), last menstrual period 03/25/2016.    Urine results:notable for trace blood, trace protein, positive nitrates, trace leukocytes refer to the ob flow sheet for FH and FHR, ,                          Physical Examination: General appearance - alert, well appearing, and in no distress                                      Abdomen - FH 39 cm ,                                                         -FHR 163 bpm                                                         soft, nontender, nondistended, no masses or organomegaly                                      Pelvic - not indicated                                            Questions were answered. Assessment: LROB [id number] @ 6530w2d Estimated Date of Delivery: 01/04/17, Hx recurrent Uti, multiple drug resistance, Macro [place] 100 mg at bedtime added to  Keflex suppression  Plan:  Continued routine obstetrical care, LROB, continue Keflex  F/u in 1 weeks for LROB   By signing my name below, I, Izna Ahmed, attest that this documentation has been prepared under the direction and in the presence of [name], Kaity Pitstick V, MD. Electronically Signed: Redge GainerIzna Ahmed, Medical Scribe. 12/30/16. 3:14 PM.  I personally performed the services described in this documentation, which was SCRIBED in my presence. The recorded information has been reviewed and considered accurate. It has been edited as necessary during review. [name],Cyrstal Leitz V, MD

## 2016-12-31 ENCOUNTER — Inpatient Hospital Stay (HOSPITAL_COMMUNITY)
Admission: AD | Admit: 2016-12-31 | Discharge: 2017-01-03 | DRG: 775 | Disposition: A | Discharge status: Home / Self Care | Payer: Medicaid Other | Source: Ambulatory Visit | Attending: Obstetrics and Gynecology | Admitting: Obstetrics and Gynecology

## 2016-12-31 ENCOUNTER — Telehealth: Payer: Self-pay | Admitting: *Deleted

## 2016-12-31 DIAGNOSIS — Z3A39 39 weeks gestation of pregnancy: Secondary | ICD-10-CM

## 2016-12-31 DIAGNOSIS — O368130 Decreased fetal movements, third trimester, not applicable or unspecified: Secondary | ICD-10-CM | POA: Diagnosis present

## 2016-12-31 DIAGNOSIS — O1494 Unspecified pre-eclampsia, complicating childbirth: Secondary | ICD-10-CM | POA: Diagnosis present

## 2016-12-31 DIAGNOSIS — Z880 Allergy status to penicillin: Secondary | ICD-10-CM

## 2016-12-31 DIAGNOSIS — Z3483 Encounter for supervision of other normal pregnancy, third trimester: Secondary | ICD-10-CM

## 2016-12-31 DIAGNOSIS — O1414 Severe pre-eclampsia complicating childbirth: Secondary | ICD-10-CM | POA: Diagnosis present

## 2016-12-31 DIAGNOSIS — O1490 Unspecified pre-eclampsia, unspecified trimester: Secondary | ICD-10-CM | POA: Diagnosis not present

## 2016-12-31 DIAGNOSIS — O1493 Unspecified pre-eclampsia, third trimester: Secondary | ICD-10-CM

## 2016-12-31 LAB — COMPREHENSIVE METABOLIC PANEL
ALBUMIN: 2.8 g/dL — AB (ref 3.5–5.0)
ALK PHOS: 143 U/L — AB (ref 38–126)
ALT: 12 U/L — AB (ref 14–54)
ANION GAP: 9 (ref 5–15)
AST: 18 U/L (ref 15–41)
BUN: 10 mg/dL (ref 6–20)
CALCIUM: 8.8 mg/dL — AB (ref 8.9–10.3)
CHLORIDE: 107 mmol/L (ref 101–111)
CO2: 22 mmol/L (ref 22–32)
CREATININE: 0.82 mg/dL (ref 0.44–1.00)
GFR calc Af Amer: >60 mL/min (ref >60)
GFR calc non Af Amer: >60 mL/min (ref >60)
GLUCOSE: 99 mg/dL (ref 65–99)
Potassium: 4 mmol/L (ref 3.5–5.1)
SODIUM: 138 mmol/L (ref 135–145)
TOTAL PROTEIN: 5.9 g/dL — AB (ref 6.5–8.1)
Total Bilirubin: 0.4 mg/dL (ref 0.3–1.2)

## 2016-12-31 LAB — TYPE AND SCREEN
ABO/RH(D): O POS
Antibody Screen: NEGATIVE

## 2016-12-31 LAB — URINALYSIS, ROUTINE W REFLEX MICROSCOPIC
BILIRUBIN URINE: NEGATIVE
Glucose, UA: NEGATIVE mg/dL
KETONES UR: NEGATIVE mg/dL
Nitrite: POSITIVE — AB
PH: 5 (ref 5.0–8.0)
PROTEIN: 30 mg/dL — AB
Specific Gravity, Urine: 1.026 (ref 1.005–1.030)

## 2016-12-31 LAB — CBC
HCT: 33.9 % — ABNORMAL LOW (ref 36.0–46.0)
HEMATOCRIT: 33.4 % — AB (ref 36.0–46.0)
HEMOGLOBIN: 11.4 g/dL — AB (ref 12.0–15.0)
Hemoglobin: 11.1 g/dL — ABNORMAL LOW (ref 12.0–15.0)
MCH: 28.6 pg (ref 26.0–34.0)
MCH: 28.8 pg (ref 26.0–34.0)
MCHC: 33.2 g/dL (ref 30.0–36.0)
MCHC: 33.6 g/dL (ref 30.0–36.0)
MCV: 85.6 fL (ref 78.0–100.0)
MCV: 86.1 fL (ref 78.0–100.0)
PLATELETS: 325 10*3/uL (ref 150–400)
Platelets: 318 10*3/uL (ref 150–400)
RBC: 3.88 MIL/uL (ref 3.87–5.11)
RBC: 3.96 MIL/uL (ref 3.87–5.11)
RDW: 14 % (ref 11.5–15.5)
RDW: 14.1 % (ref 11.5–15.5)
WBC: 8.4 10*3/uL (ref 4.0–10.5)
WBC: 9.8 10*3/uL (ref 4.0–10.5)

## 2016-12-31 LAB — PROTEIN / CREATININE RATIO, URINE
Creatinine, Urine: 307 mg/dL
Protein Creatinine Ratio: 0.06 mg/mg{Cre} (ref 0.00–0.15)
Total Protein, Urine: 17 mg/dL

## 2016-12-31 MED ORDER — FAMOTIDINE 20 MG PO TABS
40.0000 mg | ORAL_TABLET | Freq: Every day | ORAL | Status: DC
  Administered 2016-12-31: 40 mg via ORAL
  Filled 2016-12-31: qty 2

## 2016-12-31 MED ORDER — DIPHENHYDRAMINE HCL 50 MG/ML IJ SOLN
12.5000 mg | INTRAMUSCULAR | Status: DC | PRN
Start: 2016-12-31 — End: 2017-01-01
  Administered 2017-01-01: 12.5 mg via INTRAVENOUS
  Filled 2016-12-31: qty 1

## 2016-12-31 MED ORDER — LIDOCAINE HCL (PF) 1 % IJ SOLN
30.0000 mL | INTRAMUSCULAR | Status: DC | PRN
  Filled 2016-12-31: qty 30

## 2016-12-31 MED ORDER — LABETALOL HCL 5 MG/ML IV SOLN: 20.0000 mg | INTRAVENOUS | Status: DC | PRN

## 2016-12-31 MED ORDER — MAGNESIUM SULFATE 40 G IN LACTATED RINGERS - SIMPLE
2.0000 g/h | INTRAVENOUS | Status: DC
  Filled 2016-12-31: qty 500

## 2016-12-31 MED ORDER — OXYTOCIN 40 UNITS IN LACTATED RINGERS INFUSION - SIMPLE MED
1.0000 m[IU]/min | INTRAVENOUS | Status: DC
  Administered 2016-12-31: 2 m[IU]/min via INTRAVENOUS
  Filled 2016-12-31: qty 1000

## 2016-12-31 MED ORDER — FENTANYL 2.5 MCG/ML BUPIVACAINE 1/10 % EPIDURAL INFUSION (WH - ANES)
14.0000 mL/h | INTRAMUSCULAR | Status: DC | PRN
  Administered 2017-01-01: 14 mL/h via EPIDURAL
  Filled 2016-12-31: qty 100

## 2016-12-31 MED ORDER — PROMETHAZINE HCL 25 MG/ML IJ SOLN
25.0000 mg | Freq: Once | INTRAMUSCULAR | Status: AC
  Administered 2016-12-31: 25 mg via INTRAVENOUS
  Filled 2016-12-31: qty 1

## 2016-12-31 MED ORDER — HYDRALAZINE HCL 20 MG/ML IJ SOLN: 10.0000 mg | Freq: Once | INTRAMUSCULAR | Status: DC | PRN

## 2016-12-31 MED ORDER — TERBUTALINE SULFATE 1 MG/ML IJ SOLN: 0.2500 mg | Freq: Once | INTRAMUSCULAR | Status: DC | PRN

## 2016-12-31 MED ORDER — OXYCODONE-ACETAMINOPHEN 5-325 MG PO TABS: 2.0000 | ORAL_TABLET | ORAL | Status: DC | PRN

## 2016-12-31 MED ORDER — LACTATED RINGERS IV SOLN
INTRAVENOUS | Status: DC
  Administered 2016-12-31 (×3): via INTRAVENOUS

## 2016-12-31 MED ORDER — OXYTOCIN 40 UNITS IN LACTATED RINGERS INFUSION - SIMPLE MED
2.5000 [IU]/h | INTRAVENOUS | Status: DC
  Administered 2017-01-01: 2.5 [IU]/h via INTRAVENOUS

## 2016-12-31 MED ORDER — ONDANSETRON HCL 4 MG/2ML IJ SOLN
4.0000 mg | Freq: Four times a day (QID) | INTRAMUSCULAR | Status: DC | PRN
  Filled 2016-12-31: qty 2

## 2016-12-31 MED ORDER — OXYCODONE-ACETAMINOPHEN 5-325 MG PO TABS: 1.0000 | ORAL_TABLET | ORAL | Status: DC | PRN

## 2016-12-31 MED ORDER — EPHEDRINE 5 MG/ML INJ
10.0000 mg | INTRAVENOUS | Status: DC | PRN
  Administered 2017-01-01: 10 mg via INTRAVENOUS

## 2016-12-31 MED ORDER — OXYTOCIN BOLUS FROM INFUSION
500.0000 mL | Freq: Once | INTRAVENOUS | Status: AC
  Administered 2017-01-01: 500 mL via INTRAVENOUS

## 2016-12-31 MED ORDER — SOD CITRATE-CITRIC ACID 500-334 MG/5ML PO SOLN: 30.0000 mL | ORAL | Status: DC | PRN

## 2016-12-31 MED ORDER — PHENYLEPHRINE 40 MCG/ML (10ML) SYRINGE FOR IV PUSH (FOR BLOOD PRESSURE SUPPORT)
80.0000 ug | PREFILLED_SYRINGE | INTRAVENOUS | Status: DC | PRN
  Filled 2016-12-31: qty 10

## 2016-12-31 MED ORDER — MAGNESIUM SULFATE BOLUS VIA INFUSION
6.0000 g | Freq: Once | INTRAVENOUS | Status: AC
  Administered 2016-12-31: 6 g via INTRAVENOUS
  Filled 2016-12-31: qty 500

## 2016-12-31 MED ORDER — MISOPROSTOL 25 MCG QUARTER TABLET
25.0000 ug | ORAL_TABLET | ORAL | Status: DC | PRN
  Administered 2016-12-31: 25 ug via VAGINAL
  Filled 2016-12-31: qty 1

## 2016-12-31 MED ORDER — HYDROXYZINE HCL 50 MG PO TABS
25.0000 mg | ORAL_TABLET | Freq: Once | ORAL | Status: AC
  Administered 2016-12-31: 25 mg via ORAL
  Filled 2016-12-31: qty 1

## 2016-12-31 MED ORDER — PHENYLEPHRINE 40 MCG/ML (10ML) SYRINGE FOR IV PUSH (FOR BLOOD PRESSURE SUPPORT)
80.0000 ug | PREFILLED_SYRINGE | INTRAVENOUS | Status: AC | PRN
  Administered 2017-01-01 (×3): 80 ug via INTRAVENOUS

## 2016-12-31 MED ORDER — LACTATED RINGERS IV SOLN
500.0000 mL | Freq: Once | INTRAVENOUS | Status: AC
  Administered 2017-01-01: 500 mL via INTRAVENOUS

## 2016-12-31 MED ORDER — EPHEDRINE 5 MG/ML INJ
10.0000 mg | INTRAVENOUS | Status: DC | PRN
  Filled 2016-12-31: qty 4

## 2016-12-31 MED ORDER — LACTATED RINGERS IV SOLN: 500.0000 mL | INTRAVENOUS | Status: DC | PRN

## 2016-12-31 MED ORDER — ACETAMINOPHEN 325 MG PO TABS
650.0000 mg | ORAL_TABLET | ORAL | Status: DC | PRN
  Administered 2016-12-31: 650 mg via ORAL
  Filled 2016-12-31: qty 2

## 2016-12-31 MED ORDER — FENTANYL CITRATE (PF) 100 MCG/2ML IJ SOLN
100.0000 ug | INTRAMUSCULAR | Status: DC | PRN
  Administered 2016-12-31 (×4): 100 ug via INTRAVENOUS
  Filled 2016-12-31 (×4): qty 2

## 2016-12-31 NOTE — MAU Provider Note (Signed)
Chief Complaint:  Blurred Vision; Headache; and elevated blood pressure  Getting admitted for IOL 2/2 preeclampsia with severe features. Please see separate H&P for further details.   Caryl AdaJazma Icarus Partch, DO OB Fellow [facility], [facility] 12/31/2016 4:40 PM

## 2016-12-31 NOTE — MAU Note (Signed)
Last night +headache Still present today after taking tylenol  BP at home was 168/98 and 152/96  Called OB and they told her to come in  +blurred vision +dizziness  States notes an increase in swelling  +sharp intermittent pains in RUQ

## 2016-12-31 NOTE — Progress Notes (Signed)
Patient ID: Audrey [name], female   DOB: [birth year], 31 y.o.   MRN: [id number] Labor Progress Note  S: Patient seen & examined for progress of labor. Patient comfortable. Anxious about foley bulb and induction.   O: BP 113/72   Pulse (!) 101   Temp 98 F (36.7 C) (Oral)   Resp 18   Wt 119.8 kg (264 lb 1.3 oz)   LMP 03/25/2016 (Exact Date)   SpO2 100%   BMI 43.95 kg/m   FHT: 150bpm, mod var, +accels, no decels TOCO:irregular, patient looks comfortable during contractions  CVE: Dilation: 2 Effacement (%): 60 Station: -3 Presentation: Vertex Exam by:: Dr. Ashok PallWouk   A&P: 31 y.o. [id number] 5378w3d IOL for pre-e with severe features diagnosed today, on mag  Cytotec x1 @ 1830, foley bulb placed PCR 0.06 Continue induction management Anticipate SVD  SwazilandJordan Lucyle Alumbaugh, DO FM Resident PGY-1 12/31/2016 7:08 PM

## 2016-12-31 NOTE — MAU Note (Signed)
Urine in the lab  

## 2016-12-31 NOTE — H&P (Signed)
Obstetric History and Physical  Audrey [name] is a 31 y.o. [phone] with IUP at [redacted]w[redacted]d presenting for IOL for preeclampsia. Patient presented to MAU with elevated BPS with one severe range BP at home of 168/98. She endorses blurry vision, headache, worsening edema, and RUQ pain. She has not had elevated BPs in this pregnancy. States she has been having  none contractions, none vaginal bleeding, intact membranes, with decreased  fetal movement.    Prenatal Course Source of Care: FT with onset of care at 7 weeks Dating: By LMP c/w 7wk US--->  Estimated Date of Delivery: 01/04/17 Pregnancy complications or risks: Patient Active Problem List   Diagnosis Date Noted  . Preterm labor 12/04/2016  . History of preterm delivery, currently pregnant 06/17/2016  . History of cryosurgery of cervix affecting pregnancy 06/17/2016  . BMI 40.0-44.9, adult (HCC) 06/15/2016  . Pyelonephritis first trimester, then recurrent UTI 06/15/2016  . Supervision of normal pregnancy 05/15/2016  . Attention deficit hyperactivity disorder (ADHD) 04/24/2015  . BV (bacterial vaginosis) 12/30/2012  . Anxiety 06/08/2012  . Migraines 09/25/2010   She plans to pump and feed breastmilk She desires Nexplanon for postpartum contraception.   Prenatal labs and studies: ABO, Rh: --/--/O POS (07/11 0300) Antibody: NEG (07/11 0300) Rubella: 1.83 (12/20 1629) RPR: Non Reactive (05/29 0936)  HBsAg: Negative (12/20 1629)  HIV:   Non-reactive GBS: negative 2 hr88/82/78 Genetic screening normal Anatomy US normal  Prenatal Transfer Tool  Maternal Diabetes: No Genetic Screening: Normal Maternal Ultrasounds/Referrals: Normal Fetal Ultrasounds or other Referrals:  None Maternal Substance Abuse:  No Significant Maternal Medications:  Meds include: Other: Macrobid and Keflex Significant Maternal Lab Results: None  Past Medical History:  Diagnosis Date  . Allergy   . Anxiety   . BV (bacterial vaginosis) 12/30/2012  . Chicken pox    . Chronic kidney disease   . Contraceptive management 07/15/2013  . Hay fever   . History of kidney stones   . Irregular bleeding 12/30/2012  . Leukorrhea 10/07/2013   resolved s/p cryotherapy    . Migraines   . PONV (postoperative nausea and vomiting)   . Postcoital bleeding 05/13/2013   Had [place] and IUD in place, bleeding stopped after doxycycline but started back, has normal period lite x 2 days but bleeds every time with sex, is bright red and has clots and cramps after sex   . UTI (lower urinary tract infection) 08/24/2014  . UTI (urinary tract infection)     Past Surgical History:  Procedure Laterality Date  . CHOLECYSTECTOMY N/A 03/09/2014   Procedure: LAPAROSCOPIC CHOLECYSTECTOMY;  Surgeon: Dalia Heading, MD;  Location: [facility];  Service: General;  Laterality: N/A;  . DILATION AND CURETTAGE OF UTERUS    . TONSILLECTOMY      OB History  Gravida Para Term Preterm AB Living  5 3 2 1 1 3   SAB TAB Ectopic Multiple Live Births  1       3    # Outcome Date GA Lbr Len/2nd Weight Sex Delivery Anes PTL Lv  5 Current           4 Preterm 08/07/08 [redacted]w[redacted]d  4 lb (1.814 kg) M Vag-Spont EPI Y LIV  3 Term 10/16/06 [redacted]w[redacted]d  7 lb 9 oz (3.43 kg) F Vag-Spont EPI N LIV  2 Term 03/25/05 [redacted]w[redacted]d  6 lb 14 oz (3.118 kg) F Vag-Spont EPI N LIV  1 SAB 2005  Social History   Social History  . Marital status: Married    Spouse name: N/A  . Number of children: 3  . Years of education: N/A   Social History Main Topics  . Smoking status: Never Smoker  . Smokeless tobacco: Never Used  . Alcohol use No  . Drug use: No  . Sexual activity: Yes    Birth control/ protection: None   Other Topics Concern  . Not on file   Social History Narrative  . No narrative on file    Family History  Problem Relation Age of Onset  . Arthritis Mother   . Lung cancer Mother   . Hyperlipidemia Mother   . Atrial fibrillation Mother   . Stroke Mother   . Hypertension Mother   . Diabetes Father    . Dementia Father   . Breast cancer Maternal Aunt   . Diabetes Paternal Uncle     Prescriptions Prior to Admission  Medication Sig Dispense Refill Last Dose  . acetaminophen (TYLENOL) 500 MG tablet Take 500 mg by mouth every 6 (six) hours as needed for mild pain, moderate pain, fever or headache.    12/31/2016 at 1200  . cephALEXin (KEFLEX) 500 MG capsule Take 500 mg by mouth at bedtime.   12/30/2016 at Unknown time  . flintstones complete (FLINTSTONES) 60 MG chewable tablet Chew 2 tablets by mouth daily.   12/31/2016 at Unknown time  . promethazine (PHENERGAN) 25 MG tablet Take 0.5-1 tablets (12.5-25 mg total) by mouth every 6 (six) hours as needed for nausea or vomiting. 30 tablet 0 Past Week at Unknown time  . ranitidine (ZANTAC) 75 MG tablet Take 75-150 mg by mouth 3 (three) times daily. Pt takes one tablet twice daily and two tablets at bedtime.   12/30/2016 at Unknown time    Allergies  Allergen Reactions  . Bee Venom Shortness Of Breath  . Codeine Itching  . Penicillins Hives    Has patient had a PCN reaction causing immediate rash, facial/tongue/throat swelling, SOB or lightheadedness with hypotension: no Has patient had a PCN reaction causing severe rash involving mucus membranes or skin necrosis: no Has patient had a PCN reaction that required hospitalization no Has patient had a PCN reaction occurring within the last 10 years: no If all of the above answers are "NO", then may proceed with Cephalosporin use.   . Tape Hives  . Bactrim [Sulfamethoxazole-Trimethoprim] Itching and Rash    Review of Systems: Negative except for what is mentioned in HPI.  Physical Exam: BP (!) 144/105   Pulse (!) 110   Temp 98 F (36.7 C) (Oral)   Resp 18   Wt 264 lb 1.3 oz (119.8 kg)   LMP 03/25/2016 (Exact Date)   SpO2 100%   BMI 43.95 kg/m  CONSTITUTIONAL: Well-developed, well-nourished female in no acute distress.  HENT:  Normocephalic, atraumatic, External right and left ear normal.  Oropharynx is clear and moist EYES: Conjunctivae and EOM are normal. Pupils are equal, round, and reactive to light. No scleral icterus.  NECK: Normal range of motion, supple, no masses SKIN: Skin is warm and dry. No rash noted. Not diaphoretic. No erythema. No pallor. NEUROLOGIC: Alert and oriented to person, place, and time. Normal reflexes, muscle tone coordination. No cranial nerve deficit noted. PSYCHIATRIC: Normal mood and affect. Normal behavior. Normal judgment and thought content. CARDIOVASCULAR: Normal heart rate noted, regular rhythm RESPIRATORY: Effort and breath sounds normal, no problems with respiration noted ABDOMEN: Soft, nontender, nondistended, gravid. MUSCULOSKELETAL:  Normal range of motion. + edema and no tenderness. 2+ distal pulses.  Dilation: 1.5 Effacement (%): 60 Station: -3 Presentation: Vertex Exam by:: Phleps, MD   Presentation: cephalic FHT:  Baseline rate 145 bpm   Variability moderate  Accelerations present   Decelerations none Contractions: None   Pertinent Labs/Studies:   Results for orders placed or performed during the hospital encounter of 12/31/16 (from the past 24 hour(s))  Urinalysis, Routine w reflex microscopic     Status: Abnormal   Collection Time: 12/31/16  4:30 PM  Result Value Ref Range   Color, Urine STRAW (A) YELLOW   APPearance CLOUDY (A) CLEAR   Specific Gravity, Urine 1.026 1.005 - 1.030   pH 5.0 5.0 - 8.0   Glucose, UA NEGATIVE NEGATIVE mg/dL   Hgb urine dipstick SMALL (A) NEGATIVE   Bilirubin Urine NEGATIVE NEGATIVE   Ketones, ur NEGATIVE NEGATIVE mg/dL   Protein, ur 30 (A) NEGATIVE mg/dL   Nitrite POSITIVE (A) NEGATIVE   Leukocytes, UA MODERATE (A) NEGATIVE   RBC / HPF 0-5 0 - 5 RBC/hpf   WBC, UA 6-30 0 - 5 WBC/hpf   Bacteria, UA MANY (A) NONE SEEN   Squamous Epithelial / LPF 6-30 (A) NONE SEEN   Mucous PRESENT   CBC     Status: Abnormal   Collection Time: 12/31/16  5:10 PM  Result Value Ref Range   WBC 8.4 4.0 - 10.5  K/uL   RBC 3.96 3.87 - 5.11 MIL/uL   Hemoglobin 11.4 (L) 12.0 - 15.0 g/dL   HCT 16.1 (L) 09.6 - 04.5 %   MCV 85.6 78.0 - 100.0 fL   MCH 28.8 26.0 - 34.0 pg   MCHC 33.6 30.0 - 36.0 g/dL   RDW 40.9 81.1 - 91.4 %   Platelets 325 150 - 400 K/uL    Assessment : JORDYNN MARCELLA is a 31 y.o. [id number] at [redacted]w[redacted]d being admitted for induction of labor due to preeclampsia with severe features.   Plan: Labor: Induction of labor for pre-e per protocol.  Pre-eclampsia: Will start magnesium in patient. Labetalol protocol as needed.  Analgesia as needed. Plan for epidural.  FWB: Reassuring fetal heart tracing.  GBS negative  Delivery plan: Hopeful for vaginal delivery   Caryl Ada, DO OB Fellow Faculty Practice, [facility] 12/31/2016, 5:02 PM

## 2016-12-31 NOTE — Telephone Encounter (Signed)
Patient called with complaints of a headache since last night. BP160/90. Has had some blurred vision along with right upper gastic pain. She has also noticed increased edema in her face, hands and feet with 6 pound weight gain in a week. [facility]. Verbalized understanding.

## 2017-01-01 ENCOUNTER — Inpatient Hospital Stay (HOSPITAL_COMMUNITY): Payer: Medicaid Other | Admitting: Anesthesiology

## 2017-01-01 ENCOUNTER — Encounter (HOSPITAL_COMMUNITY): Payer: Self-pay | Admitting: *Deleted

## 2017-01-01 DIAGNOSIS — O1414 Severe pre-eclampsia complicating childbirth: Secondary | ICD-10-CM

## 2017-01-01 DIAGNOSIS — Z3A39 39 weeks gestation of pregnancy: Secondary | ICD-10-CM

## 2017-01-01 LAB — CBC
HEMATOCRIT: 33.7 % — AB (ref 36.0–46.0)
Hemoglobin: 11.3 g/dL — ABNORMAL LOW (ref 12.0–15.0)
MCH: 28.7 pg (ref 26.0–34.0)
MCHC: 33.5 g/dL (ref 30.0–36.0)
MCV: 85.5 fL (ref 78.0–100.0)
PLATELETS: 293 10*3/uL (ref 150–400)
RBC: 3.94 MIL/uL (ref 3.87–5.11)
RDW: 14 % (ref 11.5–15.5)
WBC: 13.7 10*3/uL — AB (ref 4.0–10.5)

## 2017-01-01 LAB — RPR: RPR Ser Ql: NONREACTIVE

## 2017-01-01 MED ORDER — OXYCODONE HCL 5 MG PO TABS
10.0000 mg | ORAL_TABLET | Freq: Once | ORAL | Status: AC
  Administered 2017-01-01: 10 mg via ORAL
  Filled 2017-01-01: qty 2

## 2017-01-01 MED ORDER — ONDANSETRON HCL 4 MG PO TABS: 4.0000 mg | ORAL_TABLET | ORAL | Status: DC | PRN

## 2017-01-01 MED ORDER — DIBUCAINE 1 % RE OINT: 1.0000 "application " | TOPICAL_OINTMENT | RECTAL | Status: DC | PRN

## 2017-01-01 MED ORDER — DIPHENHYDRAMINE HCL 25 MG PO CAPS
25.0000 mg | ORAL_CAPSULE | Freq: Four times a day (QID) | ORAL | Status: DC | PRN
  Administered 2017-01-01: 25 mg via ORAL
  Filled 2017-01-01: qty 1

## 2017-01-01 MED ORDER — MAGNESIUM SULFATE 40 G IN LACTATED RINGERS - SIMPLE
2.0000 g/h | INTRAVENOUS | Status: DC
  Filled 2017-01-01: qty 500

## 2017-01-01 MED ORDER — OXYCODONE HCL 5 MG PO TABS
5.0000 mg | ORAL_TABLET | Freq: Once | ORAL | Status: AC
  Administered 2017-01-01: 5 mg via ORAL
  Filled 2017-01-01: qty 1

## 2017-01-01 MED ORDER — ACETAMINOPHEN 325 MG PO TABS
650.0000 mg | ORAL_TABLET | ORAL | Status: DC | PRN
  Administered 2017-01-01 – 2017-01-03 (×4): 650 mg via ORAL
  Filled 2017-01-01 (×4): qty 2

## 2017-01-01 MED ORDER — DIPHENHYDRAMINE HCL 25 MG PO CAPS: 25.0000 mg | ORAL_CAPSULE | Freq: Four times a day (QID) | ORAL | Status: DC | PRN

## 2017-01-01 MED ORDER — ONDANSETRON HCL 4 MG/2ML IJ SOLN: 4.0000 mg | INTRAMUSCULAR | Status: DC | PRN

## 2017-01-01 MED ORDER — ZOLPIDEM TARTRATE 5 MG PO TABS: 5.0000 mg | ORAL_TABLET | Freq: Every evening | ORAL | Status: DC | PRN

## 2017-01-01 MED ORDER — SIMETHICONE 80 MG PO CHEW: 80.0000 mg | CHEWABLE_TABLET | ORAL | Status: DC | PRN

## 2017-01-01 MED ORDER — ACETAMINOPHEN 325 MG PO TABS: 650.0000 mg | ORAL_TABLET | ORAL | Status: DC | PRN

## 2017-01-01 MED ORDER — IBUPROFEN 600 MG PO TABS: 600.0000 mg | ORAL_TABLET | Freq: Four times a day (QID) | ORAL | Status: DC

## 2017-01-01 MED ORDER — WITCH HAZEL-GLYCERIN EX PADS: 1.0000 "application " | MEDICATED_PAD | CUTANEOUS | Status: DC | PRN

## 2017-01-01 MED ORDER — COCONUT OIL OIL: 1.0000 "application " | TOPICAL_OIL | Status: DC | PRN

## 2017-01-01 MED ORDER — LACTATED RINGERS IV SOLN
INTRAVENOUS | Status: DC
  Administered 2017-01-02: 100 mL/h via INTRAVENOUS

## 2017-01-01 MED ORDER — PRENATAL MULTIVITAMIN CH
1.0000 | ORAL_TABLET | Freq: Every day | ORAL | Status: DC
  Administered 2017-01-02 – 2017-01-03 (×2): 1 via ORAL
  Filled 2017-01-01 (×2): qty 1

## 2017-01-01 MED ORDER — LIDOCAINE HCL (PF) 1 % IJ SOLN
INTRAMUSCULAR | Status: DC | PRN
  Administered 2017-01-01: 13 mL via EPIDURAL

## 2017-01-01 MED ORDER — BENZOCAINE-MENTHOL 20-0.5 % EX AERO: 1.0000 "application " | INHALATION_SPRAY | CUTANEOUS | Status: DC | PRN

## 2017-01-01 MED ORDER — SENNOSIDES-DOCUSATE SODIUM 8.6-50 MG PO TABS
2.0000 | ORAL_TABLET | ORAL | Status: DC
  Administered 2017-01-02 – 2017-01-03 (×2): 2 via ORAL
  Filled 2017-01-01 (×2): qty 2

## 2017-01-01 MED ORDER — IBUPROFEN 600 MG PO TABS
600.0000 mg | ORAL_TABLET | Freq: Four times a day (QID) | ORAL | Status: DC
  Administered 2017-01-01 – 2017-01-03 (×9): 600 mg via ORAL
  Filled 2017-01-01 (×9): qty 1

## 2017-01-01 MED ORDER — PRENATAL MULTIVITAMIN CH: 1.0000 | ORAL_TABLET | Freq: Every day | ORAL | Status: DC

## 2017-01-01 MED ORDER — TETANUS-DIPHTH-ACELL PERTUSSIS 5-2.5-18.5 LF-MCG/0.5 IM SUSP: 0.5000 mL | Freq: Once | INTRAMUSCULAR | Status: DC

## 2017-01-01 MED ORDER — MAGNESIUM SULFATE 40 G IN LACTATED RINGERS - SIMPLE: 2.0000 g/h | INTRAVENOUS | Status: DC

## 2017-01-01 MED ORDER — SENNOSIDES-DOCUSATE SODIUM 8.6-50 MG PO TABS: 2.0000 | ORAL_TABLET | ORAL | Status: DC

## 2017-01-01 MED ORDER — COCONUT OIL OIL
1.0000 "application " | TOPICAL_OIL | Status: DC | PRN
  Administered 2017-01-01: 1 via TOPICAL
  Filled 2017-01-01: qty 120

## 2017-01-01 MED ORDER — BENZOCAINE-MENTHOL 20-0.5 % EX AERO
1.0000 "application " | INHALATION_SPRAY | CUTANEOUS | Status: DC | PRN
  Administered 2017-01-01: 1 via TOPICAL
  Filled 2017-01-01: qty 56

## 2017-01-01 NOTE — Progress Notes (Signed)
MOB was referred for history of depression/anxiety. * Referral screened out by Clinical Social Worker because none of the following criteria appear to apply: ~ History of anxiety/depression during this pregnancy, or of post-partum depression. ~ Diagnosis of anxiety and/or depression within last 3 years OR * MOB's symptoms currently being treated with medication and/or therapy. Please contact the Clinical Social Worker if needs arise, by MOB request, or if MOB scores 9 or greater/yes to question 10 on Edinburgh Postpartum Depression Screen. 

## 2017-01-01 NOTE — Anesthesia Procedure Notes (Signed)
Epidural Patient location during procedure: OB Start time: 01/01/2017 1:17 AM End time: 01/01/2017 1:32 AM  Staffing Anesthesiologist: Anitra LauthMILLER, Achilles Neville RAY Performed: anesthesiologist   Preanesthetic Checklist Completed: patient identified, site marked, surgical consent, pre-op evaluation, timeout performed, IV checked, risks and benefits discussed and monitors and equipment checked  Epidural Patient position: sitting Prep: DuraPrep Patient monitoring: heart rate, cardiac monitor, continuous pulse ox and blood pressure Approach: midline Location: L2-L3 Injection technique: LOR saline  Needle:  Needle type: Tuohy  Needle gauge: 17 G Needle length: 9 cm Needle insertion depth: 7 cm Catheter type: closed end flexible Catheter size: 20 Guage Catheter at skin depth: 11 cm Test dose: negative  Assessment Events: blood not aspirated, injection not painful, no injection resistance, negative IV test and no paresthesia  Additional Notes Reason for block:procedure for pain

## 2017-01-01 NOTE — Anesthesia Postprocedure Evaluation (Signed)
Anesthesia Post Note  Patient: Hospital doctorAmber N Austad  Procedure(s) Performed: * No procedures listed *     Patient location during evaluation: [facility] Anesthesia Type: Epidural Level of consciousness: awake and alert and oriented Pain management: pain level controlled Vital Signs Assessment: post-procedure vital signs reviewed and stable Respiratory status: spontaneous breathing and nonlabored ventilation Cardiovascular status: stable Postop Assessment: no headache, patient able to bend at knees, no backache, no signs of nausea or vomiting, epidural receding and adequate PO intake Anesthetic complications: no    Last Vitals:  Vitals:   01/01/17 0830 01/01/17 0930  BP:  124/73  Pulse:  86  Resp: 18 18  Temp:  36.8 C    Last Pain:  Vitals:   01/01/17 0930  TempSrc: Oral  PainSc: 4    Pain Goal: Patients Stated Pain Goal: 1 (01/01/17 0930)               Destany Severns Hristova

## 2017-01-01 NOTE — Progress Notes (Signed)
Called into room d/t variable decels after epidural.  Patient with BP down to 87/50 from 127/79 after epidural.  IVF bolus given. Pitocin stopped, and patient repositioned. FHT recovered.  SVE 4.5/70/bollatoble  Kandra NicolasJulie P. Skylene Deremer, MD OB Fellow

## 2017-01-01 NOTE — Anesthesia Preprocedure Evaluation (Signed)
Anesthesia Evaluation  Patient identified by MRN, date of birth, ID band Patient awake    Reviewed: Allergy & Precautions, H&P , NPO status , Patient's Chart, lab work & pertinent test results  History of Anesthesia Complications (+) history of anesthetic complications  Airway Mallampati: II  TM Distance: >3 FB Neck ROM: Full    Dental  (+) Teeth Intact   Pulmonary neg pulmonary ROS,    breath sounds clear to auscultation       Cardiovascular negative cardio ROS   Rhythm:Regular Rate:Normal     Neuro/Psych  Headaches, PSYCHIATRIC DISORDERS Anxiety    GI/Hepatic neg GERD  ,  Endo/Other    Renal/GU      Musculoskeletal   Abdominal   Peds  Hematology   Anesthesia Other Findings   Reproductive/Obstetrics (+) Pregnancy                             Anesthesia Physical  Anesthesia Plan  ASA: III  Anesthesia Plan: Epidural   Post-op Pain Management:    Induction:   PONV Risk Score and Plan:   Airway Management Planned:   Additional Equipment:   Intra-op Plan:   Post-operative Plan:   Informed Consent: I have reviewed the patients History and Physical, chart, labs and discussed the procedure including the risks, benefits and alternatives for the proposed anesthesia with the patient or authorized representative who has indicated his/her understanding and acceptance.     Plan Discussed with:   Anesthesia Plan Comments:         Anesthesia Quick Evaluation

## 2017-01-01 NOTE — Progress Notes (Signed)
LABOR PROGRESS NOTE  Audrey [name] is a 31 y.o. [phone] at 176w4d  admitted for IOL for pre-E with severe features.   Subjective: Patient reports HA.  Denies blurry vision, visual disturbances, RUQ pain.   Objective: BP (!) 112/52   Pulse (!) 104   Temp 99 F (37.2 C) (Oral)   Resp 18   Ht 5\' 1"  (1.549 m)   Wt 240 lb (108.9 kg)   LMP 03/25/2016 (Exact Date)   SpO2 97%   BMI 45.35 kg/m  or  Vitals:   01/01/17 0210 01/01/17 0215 01/01/17 0235 01/01/17 0240  BP: (!) 109/51 (!) 112/52    Pulse: 100 (!) 104    Resp:      Temp:    99 F (37.2 C)  TempSrc:    Oral  SpO2: 98% 97% 98% 97%  Weight:      Height:        Dilation: 4.5 Effacement (%): 70 Station: -3 Presentation: Vertex Exam by:: Dr. Nira Retortegele FHT: baseline rate 170, moderate varibility, +acel, variable decels Toco: ctx q2-3 min   Assessment / Plan: 31 y.o. [id number] at 5876w4d here for IOL for PreE with SF.  Labor: AROM at 03:07, clear fluid. IUPC placed. Restart Pitocin.  Fetal Wellbeing:  Cat II.  If persistent variables, will start amnioinfusion. Pain Control:  Epidural Anticipated MOD:  SVD PreE w/ SF: on IV Mag. Will treat HA.   Frederik PearJulie P Degele, MD 01/01/2017, 3:14 AM

## 2017-01-02 MED ORDER — BUTALBITAL-APAP-CAFFEINE 50-325-40 MG PO TABS
2.0000 | ORAL_TABLET | ORAL | Status: DC | PRN
  Administered 2017-01-02 (×2): 2 via ORAL
  Filled 2017-01-02 (×3): qty 2

## 2017-01-02 NOTE — Progress Notes (Signed)
Post Partum Day 1 IOL D/T SPEC Subjective: Pt with complaints this morning. Denies HA or visual changes.   Objective: Blood pressure (!) 141/91, pulse 100, temperature 98.2 F (36.8 C), temperature source Oral, resp. rate 18, height 5\' 1"  (1.549 m), weight 240 lb (108.9 kg), last menstrual period 03/25/2016, SpO2 100 %.  Physical Exam:  General: no distress Lochia: appropriate Uterine Fundus: firm Incision: healing well DVT Evaluation: No evidence of DVT seen on physical exam.   Recent Labs  12/31/16 2342 01/01/17 0855  HGB 11.1* 11.3*  HCT 33.4* 33.7*    Assessment/Plan: PPD # 1 TSVD SPEC  Off magnesium. BP stable. Will monitor. Continue with progressive care. Hopefully home tomorrow.    LOS: 2 days   Audrey [name] 01/02/2017, 7:26 AM

## 2017-01-02 NOTE — Lactation Note (Addendum)
This note was copied from a baby's chart. Lactation Consultation Note Mom desires to pump and bottle feed. Baby will not suck on a bottle. Mom attempted to give pacifier but will not suckle. W/gloved finger LC attempted to suck train. Baby has uncoordinated suck when he briefly does. Has over bite, recessed chin. Thick upper labial frenulum, anterior frenulum to tongue. Baby will not obtain seal on bottle or finger. Baby mainly lightly chomps.  Mom stated baby latched onto breast earlier but didn't feel suckling just moving mouth up and down.  Baby has large mouth. Appears to be larger than the bottle.  Finger massage on tongue to initiate peristalsis of the tongue. Baby briefly suckled finger then lost suction. Finger massaged palate, baby tried to suckle.  Hand expressed 1 ml colostrum. Mom has pendulum large breast, nipple at the bottom end. Rt. Nipple very compressible, Lt. Nipple w/some edema but still compressible.  Mom shown how to use DEBP & how to disassemble, clean, & reassemble parts. Mom knows to pump q3h for 15-20 min. Mom's head hurting w/blurred vision. Mom stated she needs sleep.  Discussed hand expression 1ml collected. Mom encouraged to feed baby 8-12 times/24 hours and with feeding cues.  Patient Name: Audrey Hoyle Barrmber Kessner ONGEX'BToday's Date: 01/02/2017 Reason for consult: Initial assessment   Maternal Data Has patient been taught Hand Expression?: Yes Does the patient have breastfeeding experience prior to this delivery?: Yes  Feeding Feeding Type: Formula  LATCH Score       Type of Nipple: Everted at rest and after stimulation  Comfort (Breast/Nipple): Soft / non-tender        Interventions Interventions: Expressed milk;Breast massage;Hand express;Breast compression;DEBP  Lactation Tools Discussed/Used Tools: Pump Breast pump type: Double-Electric Breast Pump Pump Review: Setup, frequency, and cleaning;Milk Storage Initiated by:: Peri JeffersonL. Caydn Justen RN IBCLC Date initiated::  01/02/17   Consult Status Consult Status: Follow-up Date: 01/02/17 Follow-up type: In-patient    Audrey [name], Audrey [name] 01/02/2017, 1:49 AM

## 2017-01-02 NOTE — Lactation Note (Signed)
This note was copied from a baby's chart. Lactation Consultation Note  Patient Name: Boy Hoyle Barrmber Montoya ZOXWR'UToday's Date: 01/02/2017 Reason for consult: Follow-up assessment  Baby 32 hours old. Mom reports that baby just was not satisfied at the breast, so she was able to pump 15 ml of EBM and gave to baby by bottle. Mom reports that baby tolerated well. Enc mom to put baby to breast with cues, then supplement with EBM. Enc mom to post-pump followed by hand expression. Discussed progression of milk coming to volume.   Maternal Data    Feeding Feeding Type: Breast Milk  LATCH Score                   Interventions    Lactation Tools Discussed/Used     Consult Status Consult Status: Follow-up Date: 01/03/17 Follow-up type: In-patient    Sherlyn HayJennifer D Demetrios Byron 01/02/2017, 3:44 PM

## 2017-01-02 NOTE — Lactation Note (Signed)
This note was copied from a baby's chart. Lactation Consultation Note Baby fussy, attempted to bottle feed, wouldn't suckle on bottle, not on finger. Spoon fed slowly Alimentum. Baby would spit formula back out, kept spoon under lips to catch. Tongue thrusting, chews, will not suckle. Massaged cheeks to encouraged baby to swallow. Baby started hiccupping after 8ml.  Patient Name: Audrey [name] AOZHY'QToday's Date: 01/02/2017 Reason for consult: Follow-up assessment   Maternal Data Has patient been taught Hand Expression?: Yes Does the patient have breastfeeding experience prior to this delivery?: Yes  Feeding Feeding Type: Formula  LATCH Score       Type of Nipple: Everted at rest and after stimulation  Comfort (Breast/Nipple): Soft / non-tender        Interventions Interventions: Expressed milk;Breast massage;Hand express;Breast compression;DEBP  Lactation Tools Discussed/Used Tools: Pump Breast pump type: Double-Electric Breast Pump Pump Review: Setup, frequency, and cleaning;Milk Storage Initiated by:: Peri JeffersonL. Hemi Chacko RN IBCLC Date initiated:: 01/02/17   Consult Status Consult Status: Follow-up Date: 01/02/17 Follow-up type: In-patient    Lizandra Zakrzewski, Diamond NickelLAURA G 01/02/2017, 2:26 AM

## 2017-01-02 NOTE — Lactation Note (Signed)
This note was copied from a baby's chart. Lactation Consultation Note  Patient Name: Audrey [name]: 01/02/2017 Reason for consult: Follow-up assessment  Baby 30 hours old. Mom reports that baby is now suckling at the breast very well and wants to nurse often. Mom reports that her breast milk "squirted across the room" earlier and then baby latched and she heard swallows. Mom states that the baby does not like bottle nipples, and cup feeding is too tedious. Offered to assist with latching and/or spoon-feeding, but mom declined for now, stating that she will continue to latch and nurse. Mom enc to call for assistance as needed, and has this LC's extension on her erasure board.   Maternal Data    Feeding Feeding Type: Breast Fed  LATCH Score                   Interventions    Lactation Tools Discussed/Used     Consult Status Consult Status: Follow-up [name]: 01/03/17 Follow-up type: In-patient    [name] 01/02/2017, 1:24 PM

## 2017-01-03 LAB — URINALYSIS, ROUTINE W REFLEX MICROSCOPIC
BILIRUBIN URINE: NEGATIVE
GLUCOSE, UA: NEGATIVE mg/dL
KETONES UR: NEGATIVE mg/dL
Nitrite: POSITIVE — AB
PH: 5 (ref 5.0–8.0)
Protein, ur: 30 mg/dL — AB
Specific Gravity, Urine: 1.026 (ref 1.005–1.030)

## 2017-01-03 LAB — CBC
HCT: 32.6 % — ABNORMAL LOW (ref 36.0–46.0)
Hemoglobin: 10.5 g/dL — ABNORMAL LOW (ref 12.0–15.0)
MCH: 28.5 pg (ref 26.0–34.0)
MCHC: 32.2 g/dL (ref 30.0–36.0)
MCV: 88.6 fL (ref 78.0–100.0)
PLATELETS: 280 10*3/uL (ref 150–400)
RBC: 3.68 MIL/uL — AB (ref 3.87–5.11)
RDW: 14.5 % (ref 11.5–15.5)
WBC: 8.8 10*3/uL (ref 4.0–10.5)

## 2017-01-03 MED ORDER — IBUPROFEN 600 MG PO TABS: 600.0000 mg | ORAL_TABLET | Freq: Four times a day (QID) | ORAL | 0 refills | Status: DC

## 2017-01-03 MED ORDER — NITROFURANTOIN MONOHYD MACRO 100 MG PO CAPS
100.0000 mg | ORAL_CAPSULE | Freq: Two times a day (BID) | ORAL | 0 refills | Status: DC
Start: 2017-01-03 — End: 2017-12-09

## 2017-01-03 MED ORDER — NITROFURANTOIN MONOHYD MACRO 100 MG PO CAPS
100.0000 mg | ORAL_CAPSULE | Freq: Two times a day (BID) | ORAL | Status: DC
  Administered 2017-01-03 (×2): 100 mg via ORAL
  Filled 2017-01-03 (×2): qty 1

## 2017-01-03 NOTE — Lactation Note (Signed)
This note was copied from a baby's chart. Lactation Consultation Note  Patient Name: Audrey [name] WUJWJ'XHoyle Barroday's Date: 01/03/2017 Reason for consult: Follow-up assessment    With this experienced breast feeding mom and her forth baby. Mom has decided to pump and bottle feed her baby. This is what she prefers. She said the baby was just nipple latching, but she was using cradle hold. I told mom I would come back and assist her with cross cradle, for a deep latch, if she wanted. I also told mom to always bring the baby to her, not the breast to the baby. Mom had just finished pumping 3 ounces of transitional milk. Mom and baby are now 52 hours post partum. Mom's breast are full but soft.   I gave mom a manual hand pump, and instructed her in it's use. Mom knows to call for questions/conerns.    Maternal Data    Feeding    LATCH Score                   Interventions    Lactation Tools Discussed/Used     Consult Status Consult Status: Complete Follow-up type: Call as needed    Audrey [name], Audrey [name] Anne 01/03/2017, 11:07 AM

## 2017-01-03 NOTE — Progress Notes (Signed)
Notified Dr Cliffton AstersWhite (resident) of patient's cbc and urinalysis results.  No new orders at this time.  Jtwells, rn

## 2017-01-03 NOTE — Progress Notes (Signed)
Notified Dr Debroah LoopArnold that patient was awakened by shivering, feeling cold. Temp of 100.9.  Patient also stated on this nurse's subsequent assessment of pain of 8 on right side/flank.  New orders for clean catch urinalysis and cbc stat.  Theda SersJan Rhythm Gubbels, RN

## 2017-01-03 NOTE — Discharge Summary (Signed)
OB Discharge Summary     Patient Name: Audrey [name] DOB: 02/17/1986 MRN: [id number]  Date of admission: 12/31/2016 Delivering MD: Frederik PearEGELE, JULIE P   Date of discharge: 01/03/2017  Admitting diagnosis: 39wks blurred vision, swellign high BP 168 over 96 Intrauterine pregnancy: 3433w6d     Secondary diagnosis:  Active Problems:   Pre-eclampsia   Normal labor   SVD (spontaneous vaginal delivery)  Additional problems: UTI     Discharge diagnosis: Term Pregnancy Delivered and Preeclampsia (severe)                                                                                                Post partum procedures:none  Augmentation: Pitocin and Foley Balloon  Complications: None  Hospital course:  Induction of Labor With Vaginal Delivery   31 y.o. yo [phone] at 1633w6d was admitted to the hospital 12/31/2016 for induction of labor.  Indication for induction: Preeclampsia.  Patient had an uncomplicated labor course as follows: Membrane Rupture Time/Date: 3:07 AM ,01/01/2017   Intrapartum Procedures: Episiotomy: None [1]                                         Lacerations:  1st degree [2];Perineal [11]  Patient had delivery of a Viable infant.  Information for the patient's newborn:  Richrd SoxMorris, Boy Milly [[id number]][[id number]]      01/01/2017  Details of delivery can be found in separate delivery note.  Patient had a routine postpartum course. Patient is discharged home 01/03/17.  Physical exam  Vitals:   01/02/17 2337 01/03/17 0235 01/03/17 0240 01/03/17 0424  BP: 140/61 128/69  135/80  Pulse: (!) 126 (!) 127 (!) 105 (!) 101  Resp: 18 18  18   Temp: (!) 100.9 F (38.3 C) 99 F (37.2 C)  98.2 F (36.8 C)  TempSrc: Oral Oral  Oral  SpO2: 98% 99%    Weight:      Height:       General: alert, cooperative and no distress Lochia: appropriate Uterine Fundus: firm Incision: N/A DVT Evaluation: No evidence of DVT seen on physical exam. Labs: Lab Results  Component Value Date   WBC 8.8  01/03/2017   HGB 10.5 (L) 01/03/2017   HCT 32.6 (L) 01/03/2017   MCV 88.6 01/03/2017   PLT 280 01/03/2017   CMP Latest Ref Rng & Units 12/31/2016  Glucose 65 - 99 mg/dL 99  BUN 6 - 20 mg/dL 10  Creatinine 1.300.44 - 8.651.00 mg/dL 7.840.82  Sodium 696135 - 295145 mmol/L 138  Potassium 3.5 - 5.1 mmol/L 4.0  Chloride 101 - 111 mmol/L 107  CO2 22 - 32 mmol/L 22  Calcium 8.9 - 10.3 mg/dL 2.8(U8.8(L)  Total Protein 6.5 - 8.1 g/dL 5.9(L)  Total Bilirubin 0.3 - 1.2 mg/dL 0.4  Alkaline Phos 38 - 126 U/L 143(H)  AST 15 - 41 U/L 18  ALT 14 - 54 U/L 12(L)    Discharge instruction: per After Visit Summary and "Baby and Me Booklet".  After visit  meds:  Allergies as of 01/03/2017      Reactions   Bee Venom Shortness Of Breath   Codeine Itching   Penicillins Hives   Has patient had a PCN reaction causing immediate rash, facial/tongue/throat swelling, SOB or lightheadedness with hypotension: no Has patient had a PCN reaction causing severe rash involving mucus membranes or skin necrosis: no Has patient had a PCN reaction that required hospitalization no Has patient had a PCN reaction occurring within the last 10 years: no If all of the above answers are "NO", then may proceed with Cephalosporin use.   Tape Hives   Bactrim [sulfamethoxazole-trimethoprim] Itching, Rash      Medication List    TAKE these medications   acetaminophen 500 MG tablet Commonly known as:  TYLENOL Take 500 mg by mouth every 6 (six) hours as needed for mild pain, moderate pain, fever or headache.   cephALEXin 500 MG capsule Commonly known as:  KEFLEX Take 500 mg by mouth at bedtime.   flintstones complete 60 MG chewable tablet Chew 2 tablets by mouth daily.   ibuprofen 600 MG tablet Commonly known as:  ADVIL,MOTRIN Take 1 tablet (600 mg total) by mouth every 6 (six) hours.   nitrofurantoin (macrocrystal-monohydrate) 100 MG capsule Commonly known as:  MACROBID Take 1 capsule (100 mg total) by mouth every 12 (twelve) hours.    promethazine 25 MG tablet Commonly known as:  PHENERGAN Take 0.5-1 tablets (12.5-25 mg total) by mouth every 6 (six) hours as needed for nausea or vomiting.   ranitidine 75 MG tablet Commonly known as:  ZANTAC Take 75-150 mg by mouth 3 (three) times daily. Pt takes one tablet twice daily and two tablets at bedtime.       Diet: routine diet  Activity: Advance as tolerated. Pelvic rest for 6 weeks.   Outpatient follow up:1 week BP check Follow up Appt:No future appointments. Follow up Visit:No Follow-up on file.  Postpartum contraception: Vasectomy  Newborn Data: Live born female  Birth Weight: 8 lb 9.6 oz (3901 g) APGAR: 8, 9  Baby Feeding: Breast Disposition:home with mother   01/03/2017 Scheryl Darter, MD

## 2017-01-03 NOTE — Discharge Instructions (Signed)
Preeclampsia and Eclampsia °Preeclampsia is a serious condition that develops only during pregnancy. It is also called toxemia of pregnancy. This condition causes high blood pressure along with other symptoms, such as swelling and headaches. These symptoms may develop as the condition gets worse. Preeclampsia may occur at 20 weeks of pregnancy or later. °Diagnosing and treating preeclampsia early is very important. If not treated early, it can cause serious problems for you and your baby. One problem it can lead to is eclampsia, which is a condition that causes muscle jerking or shaking (convulsions or seizures) in the mother. Delivering your baby is the best treatment for preeclampsia or eclampsia. Preeclampsia and eclampsia symptoms usually go away after your baby is born. °What are the causes? °The cause of preeclampsia is not known. °What increases the risk? °The following risk factors make you more likely to develop preeclampsia: °· Being pregnant for the first time. °· Having had preeclampsia during a past pregnancy. °· Having a family history of preeclampsia. °· Having high blood pressure. °· Being pregnant with twins or triplets. °· Being 35 or older. °· Being African-American. °· Having kidney disease or diabetes. °· Having medical conditions such as lupus or blood diseases. °· Being very overweight (obese). ° °What are the signs or symptoms? °The earliest signs of preeclampsia are: °· High blood pressure. °· Increased protein in your urine. Your health care provider will check for this at every visit before you give birth (prenatal visit). ° °Other symptoms that may develop as the condition gets worse include: °· Severe headaches. °· Sudden weight gain. °· Swelling of the hands, face, legs, and feet. °· Nausea and vomiting. °· Vision problems, such as blurred or double vision. °· Numbness in the face, arms, legs, and feet. °· Urinating less than usual. °· Dizziness. °· Slurred speech. °· Abdominal pain,  especially upper abdominal pain. °· Convulsions or seizures. ° °Symptoms generally go away after giving birth. °How is this diagnosed? °There are no screening tests for preeclampsia. Your health care provider will ask you about symptoms and check for signs of preeclampsia during your prenatal visits. You may also have tests that include: °· Urine tests. °· Blood tests. °· Checking your blood pressure. °· Monitoring your baby’s heart rate. °· Ultrasound. ° °How is this treated? °You and your health care provider will determine the treatment approach that is best for you. Treatment may include: °· Having more frequent prenatal exams to check for signs of preeclampsia, if you have an increased risk for preeclampsia. °· Bed rest. °· Reducing how much salt (sodium) you eat. °· Medicine to lower your blood pressure. °· Staying in the hospital, if your condition is severe. There, treatment will focus on controlling your blood pressure and the amount of fluids in your body (fluid retention). °· You may need to take medicine (magnesium sulfate) to prevent seizures. This medicine may be given as an injection or through an IV tube. °· Delivering your baby early, if your condition gets worse. You may have your labor started with medicine (induced), or you may have a cesarean delivery. ° °Follow these instructions at home: °Eating and drinking ° °· Drink enough fluid to keep your urine clear or pale yellow. °· Eat a healthy diet that is low in sodium. Do not add salt to your food. Check nutrition labels to see how much sodium a food or beverage contains. °· Avoid caffeine. °Lifestyle °· Do not use any products that contain nicotine or tobacco, such as cigarettes   and e-cigarettes. If you need help quitting, ask your health care provider. °· Do not use alcohol or drugs. °· Avoid stress as much as possible. Rest and get plenty of sleep. °General instructions °· Take over-the-counter and prescription medicines only as told by your  health care provider. °· When lying down, lie on your side. This keeps pressure off of your baby. °· When sitting or lying down, raise (elevate) your feet. Try putting some pillows underneath your lower legs. °· Exercise regularly. Ask your health care provider what kinds of exercise are best for you. °· Keep all follow-up and prenatal visits as told by your health care provider. This is important. °How is this prevented? °To prevent preeclampsia or eclampsia from developing during another pregnancy: °· Get proper medical care during pregnancy. Your health care provider may be able to prevent preeclampsia or diagnose and treat it early. °· Your health care provider may have you take a low-dose aspirin or a calcium supplement during your next pregnancy. °· You may have tests of your blood pressure and kidney function after giving birth. °· Maintain a healthy weight. Ask your health care provider for help managing weight gain during pregnancy. °· Work with your health care provider to manage any long-term (chronic) health conditions you have, such as diabetes or kidney problems. ° °Contact a health care provider if: °· You gain more weight than expected. °· You have headaches. °· You have nausea or vomiting. °· You have abdominal pain. °· You feel dizzy or light-headed. °Get help right away if: °· You develop sudden or severe swelling anywhere in your body. This usually happens in the legs. °· You gain 5 lbs (2.3 kg) or more during one week. °· You have severe: °? Abdominal pain. °? Headaches. °? Dizziness. °? Vision problems. °? Confusion. °? Nausea or vomiting. °· You have a seizure. °· You have trouble moving any part of your body. °· You develop numbness in any part of your body. °· You have trouble speaking. °· You have any abnormal bleeding. °· You pass out. °This information is not intended to replace advice given to you by your health care provider. Make sure you discuss any questions you have with your health  care provider. °Document Released: 05/10/2000 Document Revised: 01/09/2016 Document Reviewed: 12/18/2015 °Elsevier Interactive Patient Education © 2018 [employer] ° °

## 2017-01-03 NOTE — Progress Notes (Signed)
Notified Dr Cliffton AstersWhite that patient states that Macrobid has not worked for her in the past, but Keflex has; given her allergy to penicillin, will start her on Macrobid and reassess in a.m.  Jtwells, rn

## 2017-01-06 ENCOUNTER — Emergency Department (HOSPITAL_COMMUNITY): Payer: Medicaid Other

## 2017-01-06 ENCOUNTER — Encounter: Payer: Medicaid Other | Admitting: Obstetrics and Gynecology

## 2017-01-06 ENCOUNTER — Encounter (HOSPITAL_COMMUNITY): Payer: Self-pay

## 2017-01-06 ENCOUNTER — Emergency Department (HOSPITAL_COMMUNITY)
Admission: EM | Admit: 2017-01-06 | Discharge: 2017-01-06 | Disposition: A | Discharge status: Home / Self Care | Payer: Medicaid Other | Attending: Emergency Medicine | Admitting: Emergency Medicine

## 2017-01-06 DIAGNOSIS — R0602 Shortness of breath: Secondary | ICD-10-CM | POA: Diagnosis not present

## 2017-01-06 DIAGNOSIS — R079 Chest pain, unspecified: Secondary | ICD-10-CM | POA: Insufficient documentation

## 2017-01-06 DIAGNOSIS — N189 Chronic kidney disease, unspecified: Secondary | ICD-10-CM | POA: Insufficient documentation

## 2017-01-06 LAB — URINALYSIS, ROUTINE W REFLEX MICROSCOPIC
BILIRUBIN URINE: NEGATIVE
Bacteria, UA: NONE SEEN
GLUCOSE, UA: NEGATIVE mg/dL
KETONES UR: 20 mg/dL — AB
Nitrite: NEGATIVE
PH: 6 (ref 5.0–8.0)
Protein, ur: 100 mg/dL — AB
SPECIFIC GRAVITY, URINE: 1.017 (ref 1.005–1.030)

## 2017-01-06 LAB — COMPREHENSIVE METABOLIC PANEL
ALK PHOS: 163 U/L — AB (ref 38–126)
ALT: 44 U/L (ref 14–54)
AST: 21 U/L (ref 15–41)
Albumin: 2.8 g/dL — ABNORMAL LOW (ref 3.5–5.0)
Anion gap: 8 (ref 5–15)
BUN: 15 mg/dL (ref 6–20)
CHLORIDE: 109 mmol/L (ref 101–111)
CO2: 25 mmol/L (ref 22–32)
CREATININE: 0.77 mg/dL (ref 0.44–1.00)
Calcium: 8.4 mg/dL — ABNORMAL LOW (ref 8.9–10.3)
GFR calc Af Amer: >60 mL/min (ref >60)
GFR calc non Af Amer: >60 mL/min (ref >60)
Glucose, Bld: 90 mg/dL (ref 65–99)
Potassium: 3.7 mmol/L (ref 3.5–5.1)
SODIUM: 142 mmol/L (ref 135–145)
Total Bilirubin: 0.5 mg/dL (ref 0.3–1.2)
Total Protein: 6.5 g/dL (ref 6.5–8.1)

## 2017-01-06 LAB — CBC WITH DIFFERENTIAL/PLATELET
BASOS ABS: 0 10*3/uL (ref 0.0–0.1)
Basophils Relative: 0 %
EOS ABS: 0.1 10*3/uL (ref 0.0–0.7)
EOS PCT: 1 %
HCT: 32.4 % — ABNORMAL LOW (ref 36.0–46.0)
Hemoglobin: 10.5 g/dL — ABNORMAL LOW (ref 12.0–15.0)
LYMPHS PCT: 13 %
Lymphs Abs: 0.8 10*3/uL (ref 0.7–4.0)
MCH: 28.8 pg (ref 26.0–34.0)
MCHC: 32.4 g/dL (ref 30.0–36.0)
MCV: 88.8 fL (ref 78.0–100.0)
Monocytes Absolute: 0.6 10*3/uL (ref 0.1–1.0)
Monocytes Relative: 9 %
NEUTROS PCT: 77 %
Neutro Abs: 5 10*3/uL (ref 1.7–7.7)
PLATELETS: 367 10*3/uL (ref 150–400)
RBC: 3.65 MIL/uL — AB (ref 3.87–5.11)
RDW: 14 % (ref 11.5–15.5)
WBC: 6.5 10*3/uL (ref 4.0–10.5)

## 2017-01-06 LAB — D-DIMER, QUANTITATIVE (NOT AT ARMC): D DIMER QUANT: 3.48 ug{FEU}/mL — AB (ref 0.00–0.50)

## 2017-01-06 LAB — TROPONIN I: Troponin I: <0.03 ng/mL (ref <0.03)

## 2017-01-06 MED ORDER — IOPAMIDOL (ISOVUE-370) INJECTION 76%
100.0000 mL | Freq: Once | INTRAVENOUS | Status: AC | PRN
  Administered 2017-01-06: 100 mL via INTRAVENOUS

## 2017-01-06 NOTE — ED Triage Notes (Signed)
Patient reports of vaginal delivery Wednesday. Started having chest pain and shortness of breath Friday. States pain radiates to back and feels like she is drowning when laying pain. Also reports of increased swelling to bilateral legs since delivery. Patient had pre-eclampsia and was induced.

## 2017-01-06 NOTE — ED Provider Notes (Signed)
[facility] Provider Note   CSN: [id number] Arrival date & time: 01/06/17  1854     History   Chief Complaint Chief Complaint  Patient presents with  . Shortness of Breath  . Chest Pain    HPI Audrey [name] is a 31 y.o. female.   Shortness of Breath  This is a new problem. The average episode lasts 3 days. The problem occurs continuously.The problem has not changed since onset.Associated symptoms include chest pain. Pertinent negatives include no fever, no rhinorrhea, no sore throat, no cough and no sputum production.  Chest Pain   Associated symptoms include shortness of breath. Pertinent negatives include no cough, no fever and no sputum production.    Past Medical History:  Diagnosis Date  . Allergy   . Anxiety   . BV (bacterial vaginosis) 12/30/2012  . Chicken pox   . Chronic kidney disease   . Contraceptive management 07/15/2013  . Hay fever   . History of kidney stones   . Irregular bleeding 12/30/2012  . Leukorrhea 10/07/2013   resolved s/p cryotherapy    . Migraines   . PONV (postoperative nausea and vomiting)   . Postcoital bleeding 05/13/2013   Had [place] and IUD in place, bleeding stopped after doxycycline but started back, has normal period lite x 2 days but bleeds every time with sex, is bright red and has clots and cramps after sex   . UTI (lower urinary tract infection) 08/24/2014  . UTI (urinary tract infection)     Patient Active Problem List   Diagnosis Date Noted  . SVD (spontaneous vaginal delivery) 01/01/2017  . Pre-eclampsia 12/31/2016  . Normal labor 12/31/2016  . Preterm labor 12/04/2016  . History of preterm delivery, currently pregnant 06/17/2016  . History of cryosurgery of cervix affecting pregnancy 06/17/2016  . BMI 40.0-44.9, adult (HCC) 06/15/2016  . Pyelonephritis first trimester, then recurrent UTI 06/15/2016  . Supervision of normal pregnancy 05/15/2016  . Attention deficit hyperactivity disorder (ADHD) 04/24/2015  . BV  (bacterial vaginosis) 12/30/2012  . Anxiety 06/08/2012  . Migraines 09/25/2010    Past Surgical History:  Procedure Laterality Date  . CHOLECYSTECTOMY N/A 03/09/2014   Procedure: LAPAROSCOPIC CHOLECYSTECTOMY;  Surgeon: Dalia Heading, MD;  Location: [facility];  Service: General;  Laterality: N/A;  . DILATION AND CURETTAGE OF UTERUS    . TONSILLECTOMY      OB History    Gravida Para Term Preterm AB Living   5 3 2 1 1 3    SAB TAB Ectopic Multiple Live Births   1       3       Home Medications    Prior to Admission medications   Medication Sig Start Date End Date Taking? Authorizing Provider  acetaminophen (TYLENOL) 500 MG tablet Take 500 mg by mouth every 6 (six) hours as needed for mild pain, moderate pain, fever or headache.    Yes [provider]  flintstones complete (FLINTSTONES) 60 MG chewable tablet Chew 2 tablets by mouth daily.   Yes [provider]  ibuprofen (ADVIL,MOTRIN) 600 MG tablet Take 1 tablet (600 mg total) by mouth every 6 (six) hours. 01/03/17  Yes Adam Phenix, MD  nitrofurantoin, macrocrystal-monohydrate, (MACROBID) 100 MG capsule Take 1 capsule (100 mg total) by mouth every 12 (twelve) hours. 01/03/17  Yes Adam Phenix, MD  promethazine (PHENERGAN) 25 MG tablet Take 0.5-1 tablets (12.5-25 mg total) by mouth every 6 (six) hours as needed for nausea or vomiting. 12/26/16  Yes Cheral Marker, CNM    Family History Family History  Problem Relation Age of Onset  . Arthritis Mother   . Lung cancer Mother   . Hyperlipidemia Mother   . Atrial fibrillation Mother   . Stroke Mother   . Hypertension Mother   . Diabetes Father   . Dementia Father   . Breast cancer Maternal Aunt   . Diabetes Paternal Uncle     Social History Social History  Substance Use Topics  . Smoking status: Never Smoker  . Smokeless tobacco: Never Used  . Alcohol use No     Allergies   Bee venom; Codeine; Penicillins; Tape; and Bactrim  [sulfamethoxazole-trimethoprim]   Review of Systems Review of Systems  Constitutional: Negative for fever.  HENT: Negative for rhinorrhea and sore throat.   Respiratory: Positive for shortness of breath. Negative for cough and sputum production.   Cardiovascular: Positive for chest pain.  All other systems reviewed and are negative.    Physical Exam Updated Vital Signs BP 131/76   Pulse 80   Temp 98.3 F (36.8 C) (Oral)   Resp 20   Ht 5\' 1"  (1.549 m)   Wt 108.9 kg (240 lb)   LMP 03/25/2016 (Exact Date)   SpO2 98%   Breastfeeding? Unknown   BMI 45.35 kg/m   Physical Exam  Constitutional: She is oriented to person, place, and time. She appears well-developed and well-nourished.  HENT:  Head: Normocephalic and atraumatic.  Eyes: Conjunctivae and EOM are normal.  Neck: Normal range of motion.  Cardiovascular: Normal rate and regular rhythm.   Pulmonary/Chest: Effort normal. No stridor. No respiratory distress. She has no wheezes. She has no rales.  Abdominal: Soft. She exhibits no distension.  Musculoskeletal: She exhibits edema (mild lower extremity and upper extremity).  Neurological: She is alert and oriented to person, place, and time. She displays normal reflexes. No cranial nerve deficit. Coordination normal.  Skin: Skin is warm and dry.  Nursing note and vitals reviewed.    ED Treatments / Results  Labs (all labs ordered are listed, but only abnormal results are displayed) Labs Reviewed  CBC WITH DIFFERENTIAL/PLATELET - Abnormal; Notable for the following:       Result Value   RBC 3.65 (*)    Hemoglobin 10.5 (*)    HCT 32.4 (*)    All other components within normal limits  COMPREHENSIVE METABOLIC PANEL - Abnormal; Notable for the following:    Calcium 8.4 (*)    Albumin 2.8 (*)    Alkaline Phosphatase 163 (*)    All other components within normal limits  URINALYSIS, ROUTINE W REFLEX MICROSCOPIC - Abnormal; Notable for the following:    APPearance HAZY  (*)    Hgb urine dipstick LARGE (*)    Ketones, ur 20 (*)    Protein, ur 100 (*)    Leukocytes, UA MODERATE (*)    Squamous Epithelial / LPF 6-30 (*)    Non Squamous Epithelial 0-5 (*)    All other components within normal limits  D-DIMER, QUANTITATIVE (NOT AT [facility]) - Abnormal; Notable for the following:    D-Dimer, Quant 3.48 (*)    All other components within normal limits  URINE CULTURE  TROPONIN I    EKG  EKG Interpretation  Date/Time:  Monday January 06 2017 19:02:43 EDT Ventricular Rate:  77 PR Interval:    QRS Duration: 97 QT Interval:  356 QTC Calculation: 403 R Axis:   71 Text Interpretation:  Sinus rhythm  improved T wave changes in septal leads compared to september 2006 Confirmed by Marily MemosMesner, Shondra Capps [phone]([id number]) on 01/06/2017 9:07:18 PM       Radiology Ct Angio Chest Pe W And/or Wo Contrast  Result Date: 01/06/2017 CLINICAL DATA:  Vaginal delivery of last week with chest pain and shortness of breath. EXAM: CT ANGIOGRAPHY CHEST WITH CONTRAST TECHNIQUE: Multidetector CT imaging of the chest was performed using the standard protocol during bolus administration of intravenous contrast. Multiplanar CT image reconstructions and MIPs were obtained to evaluate the vascular anatomy. CONTRAST:  100 mL Isovue 370 IV COMPARISON:  None. FINDINGS: Cardiovascular: Although the pulmonary arteries are not optimally opacified at the subsegmental level, the study is felt to be adequate to exclude significant pulmonary embolism. No evidence of right heart strain. The heart size is normal. No pericardial fluid. The thoracic aorta is well opacified and is of normal caliber. No evidence of aortic dissection. Mediastinum/Nodes: No enlarged mediastinal, hilar, or axillary lymph nodes. Thyroid gland, trachea, and esophagus demonstrate no significant findings. Lungs/Pleura: Mild atelectasis at the lung bases bilaterally. There is no evidence of pulmonary edema, consolidation, pneumothorax, nodule or pleural  fluid. Upper Abdomen: No acute abnormality. Musculoskeletal: No chest wall abnormality. No acute or significant osseous findings. Review of the MIP images confirms the above findings. IMPRESSION: No evidence of pulmonary embolism, aortic pathology or other acute findings. Mild bibasilar atelectasis present. Electronically Signed   By: Irish LackGlenn  Yamagata M.D.   On: 01/06/2017 21:00    Procedures Procedures (including critical care time)  Medications Ordered in ED Medications  iopamidol (ISOVUE-370) 76 % injection 100 mL (100 mLs Intravenous Contrast Given 01/06/17 2042)     Initial Impression / Assessment and Plan / ED Course  I have reviewed the triage vital signs and the nursing notes.  Pertinent labs & imaging results that were available during my care of the patient were reviewed by me and considered in my medical decision making (see chart for details).     Workup for Pulmonary embolus, congestive heart failure, cardiomyopathy, preeclampsia and infection were all negative. Low suspicion for emergent causes for symptoms at this time. Will dc to fu w/ pcp for further management/workup.  Final Clinical Impressions(s) / ED Diagnoses   Final diagnoses:  Chest pain, unspecified type  Shortness of breath      Stella Encarnacion, Barbara CowerJason, MD 01/07/17 1824

## 2017-01-06 NOTE — ED Notes (Signed)
Pt requesting apple juice. Pt made aware that she couldn't have anything to drink until all of her results are back.

## 2017-01-07 ENCOUNTER — Encounter: Payer: Self-pay | Admitting: Obstetrics & Gynecology

## 2017-01-07 ENCOUNTER — Ambulatory Visit (INDEPENDENT_AMBULATORY_CARE_PROVIDER_SITE_OTHER): Payer: Medicaid Other | Admitting: Obstetrics & Gynecology

## 2017-01-07 VITALS — BP 150/98 | HR 80 | Wt 249.0 lb

## 2017-01-07 DIAGNOSIS — Z0130 Encounter for examination of blood pressure without abnormal findings: Secondary | ICD-10-CM | POA: Diagnosis not present

## 2017-01-07 MED ORDER — TRIAMTERENE-HCTZ 50-25 MG PO CAPS: 1.0000 | ORAL_CAPSULE | ORAL | 0 refills | Status: DC

## 2017-01-07 NOTE — Progress Notes (Signed)
Chief Complaint  Patient presents with  . Hospitalization Follow-up    check BP- headache since last Monday    Blood pressure (!) 150/98, pulse 80, weight 249 lb (112.9 kg), last menstrual period 03/25/2016, unknown if currently breastfeeding.  31 y.o. [id number] Patient's last menstrual period was 03/25/2016 (exact date). The current method of family planning is nexplanon at pp encounter, until husband gets his vasectomy.  Outpatient Encounter Prescriptions as of 01/07/2017  Medication Sig  . flintstones complete (FLINTSTONES) 60 MG chewable tablet Chew 2 tablets by mouth daily.  Marland Kitchen ibuprofen (ADVIL,MOTRIN) 600 MG tablet Take 1 tablet (600 mg total) by mouth every 6 (six) hours.  . nitrofurantoin, macrocrystal-monohydrate, (MACROBID) 100 MG capsule Take 1 capsule (100 mg total) by mouth every 12 (twelve) hours.  Marland Kitchen acetaminophen (TYLENOL) 500 MG tablet Take 500 mg by mouth every 6 (six) hours as needed for mild pain, moderate pain, fever or headache.   . promethazine (PHENERGAN) 25 MG tablet Take 0.5-1 tablets (12.5-25 mg total) by mouth every 6 (six) hours as needed for nausea or vomiting. (Patient not taking: Reported on 01/07/2017)  . triamterene-hydrochlorothiazide (DYAZIDE) 50-25 MG capsule Take 1 capsule by mouth every morning.   No facility-administered encounter medications on file as of 01/07/2017.     Subjective Patient is in 6 days postpartum from a vaginal delivery she was induced due to preeclampsia and is here today for blood pressure check Her blood pressure today is 150/98 She was seen in the emergency department yesterday because of chest pain shortness of breath but her workup was completely negative including laboratory data  Objective 1-2+ edema Blood pressure (!) 150/98, pulse 80, weight 249 lb (112.9 kg), last menstrual period 03/25/2016, unknown if currently breastfeeding.    Pertinent ROS No burning with urination, frequency or urgency No nausea, vomiting  or diarrhea Nor fever chills or other constitutional symptoms   Labs or studies Results for orders placed or performed during the hospital encounter of 01/06/17 (from the past 48 hour(s))  CBC with Differential   Collection Time: 01/06/17  7:14 PM  Result Value Ref Range   WBC 6.5 4.0 - 10.5 K/uL   RBC 3.65 (L) 3.87 - 5.11 MIL/uL   Hemoglobin 10.5 (L) 12.0 - 15.0 g/dL   HCT 84.1 (L) 32.4 - 40.1 %   MCV 88.8 78.0 - 100.0 fL   MCH 28.8 26.0 - 34.0 pg   MCHC 32.4 30.0 - 36.0 g/dL   RDW 02.7 25.3 - 66.4 %   Platelets 367 150 - 400 K/uL   Neutrophils Relative % 77 %   Neutro Abs 5.0 1.7 - 7.7 K/uL   Lymphocytes Relative 13 %   Lymphs Abs 0.8 0.7 - 4.0 K/uL   Monocytes Relative 9 %   Monocytes Absolute 0.6 0.1 - 1.0 K/uL   Eosinophils Relative 1 %   Eosinophils Absolute 0.1 0.0 - 0.7 K/uL   Basophils Relative 0 %   Basophils Absolute 0.0 0.0 - 0.1 K/uL  Comprehensive metabolic panel   Collection Time: 01/06/17  7:14 PM  Result Value Ref Range   Sodium 142 135 - 145 mmol/L   Potassium 3.7 3.5 - 5.1 mmol/L   Chloride 109 101 - 111 mmol/L   CO2 25 22 - 32 mmol/L   Glucose, Bld 90 65 - 99 mg/dL   BUN 15 6 - 20 mg/dL   Creatinine, Ser 4.03 0.44 - 1.00 mg/dL   Calcium 8.4 (L) 8.9 - 10.3  mg/dL   Total Protein 6.5 6.5 - 8.1 g/dL   Albumin 2.8 (L) 3.5 - 5.0 g/dL   AST 21 15 - 41 U/L   ALT 44 14 - 54 U/L   Alkaline Phosphatase 163 (H) 38 - 126 U/L   Total Bilirubin 0.5 0.3 - 1.2 mg/dL   GFR calc non Af Amer >60 >60 mL/min   GFR calc Af Amer >60 >60 mL/min   Anion gap 8 5 - 15  Troponin I   Collection Time: 01/06/17  7:14 PM  Result Value Ref Range   Troponin I <0.03 <0.03 ng/mL  D-dimer, quantitative (not at [facility])   Collection Time: 01/06/17  7:14 PM  Result Value Ref Range   D-Dimer, Quant 3.48 (H) 0.00 - 0.50 ug/mL-FEU  Urinalysis, Routine w reflex microscopic   Collection Time: 01/06/17  7:34 PM  Result Value Ref Range   Color, Urine YELLOW YELLOW   APPearance HAZY (A)  CLEAR   Specific Gravity, Urine 1.017 1.005 - 1.030   pH 6.0 5.0 - 8.0   Glucose, UA NEGATIVE NEGATIVE mg/dL   Hgb urine dipstick LARGE (A) NEGATIVE   Bilirubin Urine NEGATIVE NEGATIVE   Ketones, ur 20 (A) NEGATIVE mg/dL   Protein, ur 161 (A) NEGATIVE mg/dL   Nitrite NEGATIVE NEGATIVE   Leukocytes, UA MODERATE (A) NEGATIVE   RBC / HPF TOO NUMEROUS TO COUNT 0 - 5 RBC/hpf   WBC, UA TOO NUMEROUS TO COUNT 0 - 5 WBC/hpf   Bacteria, UA NONE SEEN NONE SEEN   Squamous Epithelial / LPF 6-30 (A) NONE SEEN   Mucous PRESENT    Non Squamous Epithelial 0-5 (A) NONE SEEN       Impression Diagnoses this Encounter::   ICD-10-CM   1. BP check Z01.30     Established relevant diagnosis(es):   Plan/Recommendations: Meds ordered this encounter  Medications  . triamterene-hydrochlorothiazide (DYAZIDE) 50-25 MG capsule    Sig: Take 1 capsule by mouth every morning.    Dispense:  14 capsule    Refill:  0    Labs or Scans Ordered: No orders of the defined types were placed in this encounter.   Management:: Again triamterene 50 and hydrochlorothiazide 25 one a day for the next 2 weeks which should manage her blood pressure as well as help with her shifts postpartum We'll see her back in a week or so to recheck her blood pressure  Follow up Return in about 8 days (around 01/15/2017) for Follow up.        Face to face time:  10 minutes  Greater than 50% of the visit time was spent in counseling and coordination of care with the patient.  The summary and outline of the counseling and care coordination is summarized in the note above.   All questions were answered.  Past Medical History:  Diagnosis Date  . Allergy   . Anxiety   . BV (bacterial vaginosis) 12/30/2012  . Chicken pox   . Chronic kidney disease   . Contraceptive management 07/15/2013  . Hay fever   . History of kidney stones   . Irregular bleeding 12/30/2012  . Leukorrhea 10/07/2013   resolved s/p cryotherapy    .  Migraines   . PONV (postoperative nausea and vomiting)   . Postcoital bleeding 05/13/2013   Had [place] and IUD in place, bleeding stopped after doxycycline but started back, has normal period lite x 2 days but bleeds every time with sex, is bright red and  has clots and cramps after sex   . UTI (lower urinary tract infection) 08/24/2014  . UTI (urinary tract infection)     Past Surgical History:  Procedure Laterality Date  . CHOLECYSTECTOMY N/A 03/09/2014   Procedure: LAPAROSCOPIC CHOLECYSTECTOMY;  Surgeon: Dalia HeadingMark A Jenkins, MD;  Location: [facility];  Service: General;  Laterality: N/A;  . DILATION AND CURETTAGE OF UTERUS    . TONSILLECTOMY      OB History    Gravida Para Term Preterm AB Living   5 3 2 1 1 3    SAB TAB Ectopic Multiple Live Births   1       3      Allergies  Allergen Reactions  . Bee Venom Shortness Of Breath  . Codeine Itching  . Penicillins Hives    Has patient had a PCN reaction causing immediate rash, facial/tongue/throat swelling, SOB or lightheadedness with hypotension: no Has patient had a PCN reaction causing severe rash involving mucus membranes or skin necrosis: no Has patient had a PCN reaction that required hospitalization no Has patient had a PCN reaction occurring within the last 10 years: no If all of the above answers are "NO", then may proceed with Cephalosporin use.   . Tape Hives  . Bactrim [Sulfamethoxazole-Trimethoprim] Itching and Rash    Social History   Social History  . Marital status: Married    Spouse name: N/A  . Number of children: 3  . Years of education: N/A   Social History Main Topics  . Smoking status: Never Smoker  . Smokeless tobacco: Never Used  . Alcohol use No  . Drug use: No  . Sexual activity: Not Currently    Birth control/ protection: None   Other Topics Concern  . None   Social History Narrative  . None    Family History  Problem Relation Age of Onset  . Arthritis Mother   . Lung cancer Mother   .  Hyperlipidemia Mother   . Atrial fibrillation Mother   . Stroke Mother   . Hypertension Mother   . Diabetes Father   . Dementia Father   . Breast cancer Maternal Aunt   . Diabetes Paternal Uncle

## 2017-01-08 ENCOUNTER — Other Ambulatory Visit: Payer: Self-pay | Admitting: Women's Health

## 2017-01-08 LAB — URINE CULTURE

## 2017-01-15 ENCOUNTER — Ambulatory Visit (INDEPENDENT_AMBULATORY_CARE_PROVIDER_SITE_OTHER): Payer: Medicaid Other | Admitting: Obstetrics and Gynecology

## 2017-01-15 ENCOUNTER — Encounter: Payer: Self-pay | Admitting: Obstetrics and Gynecology

## 2017-01-15 VITALS — BP 120/78 | HR 95 | Ht 64.0 in | Wt 231.0 lb

## 2017-01-15 DIAGNOSIS — N390 Urinary tract infection, site not specified: Secondary | ICD-10-CM

## 2017-01-15 DIAGNOSIS — Z87440 Personal history of urinary (tract) infections: Secondary | ICD-10-CM

## 2017-01-15 LAB — POCT URINALYSIS DIPSTICK
GLUCOSE UA: NEGATIVE
Ketones, UA: NEGATIVE
NITRITE UA: POSITIVE

## 2017-01-15 NOTE — Progress Notes (Signed)
Patient ID: Audrey [name], female   DOB: Jun 28, 1985, 31 y.o.   MRN: [id number]   [facility] Visit  @DATE @            Patient name: Audrey [name] MRN [id number]  Date of birth: 1986/03/19  CC & HPI:  Audrey [name] is a 31 y.o. female presenting today for a blood pressure check due to recent elevated readings. Pt states that she is currently still taking microbid prescription and only went 2 days without it during her delivery 2 weeks ago, but was immediately started back on the prescription. Denies hx of kidney stones and this was ruled out by [place].   ROS:  ROS  All systems are negative except as noted in the HPI and PMH.     Pertinent History Reviewed:   Reviewed: Significant for Patient admitted for preeclampsia or features due to headache she was on IV magnesium at and required induction with Cytotec Foley bulb and Pitocin she delivered at 39 weeks 4 days healthy female infant Apgars 8 and 9 Medical         Past Medical History:  Diagnosis Date  . Allergy   . Anxiety   . BV (bacterial vaginosis) 12/30/2012  . Chicken pox   . Chronic kidney disease   . Contraceptive management 07/15/2013  . Hay fever   . History of kidney stones   . Irregular bleeding 12/30/2012  . Leukorrhea 10/07/2013   resolved s/p cryotherapy    . Migraines   . PONV (postoperative nausea and vomiting)   . Postcoital bleeding 05/13/2013   Had [place] and IUD in place, bleeding stopped after doxycycline but started back, has normal period lite x 2 days but bleeds every time with sex, is bright red and has clots and cramps after sex   . UTI (lower urinary tract infection) 08/24/2014  . UTI (urinary tract infection)                               Surgical Hx:    Past Surgical History:  Procedure Laterality Date  . CHOLECYSTECTOMY N/A 03/09/2014   Procedure: LAPAROSCOPIC CHOLECYSTECTOMY;  Surgeon: Dalia Heading, MD;  Location: [facility];  Service: General;  Laterality: N/A;  . DILATION AND CURETTAGE OF UTERUS     . TONSILLECTOMY     Medications: Reviewed & Updated - see associated section                       Current Outpatient Prescriptions:  .  acetaminophen (TYLENOL) 500 MG tablet, Take 500 mg by mouth every 6 (six) hours as needed for mild pain, moderate pain, fever or headache. , Disp: , Rfl:  .  flintstones complete (FLINTSTONES) 60 MG chewable tablet, Chew 2 tablets by mouth daily., Disp: , Rfl:  .  hydrocortisone (ANUSOL-HC) 2.5 % rectal cream, INSERT 1 APPLICATION RECTALLY TWICE DAILY., Disp: 30 g, Rfl: 0 .  nitrofurantoin, macrocrystal-monohydrate, (MACROBID) 100 MG capsule, Take 1 capsule (100 mg total) by mouth every 12 (twelve) hours., Disp: 14 capsule, Rfl: 0 .  triamterene-hydrochlorothiazide (DYAZIDE) 50-25 MG capsule, Take 1 capsule by mouth every morning., Disp: 14 capsule, Rfl: 0 .  ibuprofen (ADVIL,MOTRIN) 600 MG tablet, Take 1 tablet (600 mg total) by mouth every 6 (six) hours. (Patient not taking: Reported on 01/15/2017), Disp: 30 tablet, Rfl: 0   Social History: Reviewed -  reports that she has  never smoked. She has never used smokeless tobacco.  Objective Findings:  Vitals: Blood pressure 120/78, pulse 95, height 5\' 4"  (1.626 m), weight 231 lb (104.8 kg), last menstrual period 03/25/2016, currently breastfeeding.  Physical Examination: discussion only  Discussion: 1. Discussed with pt risks and benefits of persistent UTI   At end of discussion, pt had opportunity to ask questions and has no further questions at this time.   Specific discussion of persistent UTI as noted above. Greater than 50% was spent in counseling and coordination of care with the patient.   Total time greater than: 15 minutes.   Assessment & Plan:   A:  1. Persistent chronic UTI 2. Resolved post partum HTN  P:  1. Send urine for culture 2. Referral to urologist, [facility] after post partum visit  3. D/C diazide Rx 4. Follow up in 4 weeks for post partum check   By signing my  name below, I, [name], attest that this documentation has been prepared under the direction and in the presence of Tilda Burrow, MD. Electronically Signed: [name], ED Scribe. 01/15/17. 2:24 PM.  I personally performed the services described in this documentation, which was SCRIBED in my presence. The recorded information has been reviewed and considered accurate. It has been edited as necessary during review. Tilda Burrow, MD

## 2017-02-05 ENCOUNTER — Ambulatory Visit: Payer: Medicaid Other | Admitting: Advanced Practice Midwife

## 2017-02-13 ENCOUNTER — Encounter: Payer: Self-pay | Admitting: Family Medicine

## 2017-09-11 ENCOUNTER — Telehealth: Payer: Self-pay | Admitting: Obstetrics & Gynecology

## 2017-09-11 MED ORDER — HYDROCHLOROTHIAZIDE 25 MG PO TABS: ORAL_TABLET | ORAL | 0 refills | Status: DC

## 2017-09-11 NOTE — Telephone Encounter (Signed)
Pt having some swelling in feet, requests fluid pill, she is breastfeeding but has lots of milk. If not better, call back for appt

## 2017-11-02 IMAGING — CT CT ANGIO CHEST
2 of 6 series · 19 of 46 positions shown · IV contrast (Isovue)
Comparison: None.

CLINICAL DATA: Vaginal delivery of last week with chest pain and
shortness of breath.

EXAM:
CT ANGIOGRAPHY CHEST WITH CONTRAST
TECHNIQUE: Multidetector CT imaging of the chest was performed using the
standard protocol during bolus administration of intravenous
contrast. Multiplanar CT image reconstructions and MIPs were
obtained to evaluate the vascular anatomy.
CONTRAST:  100 mL Isovue 370 IV

[Series 5: thins · axial · 0.73mm/px · z∈[+1356,+1637]mm · 16 of 309 slices shown]
[im 14/309  lung]
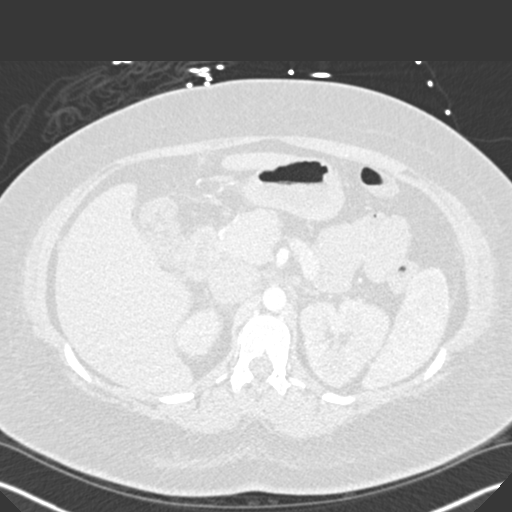
[im 41/309  soft-tissue]
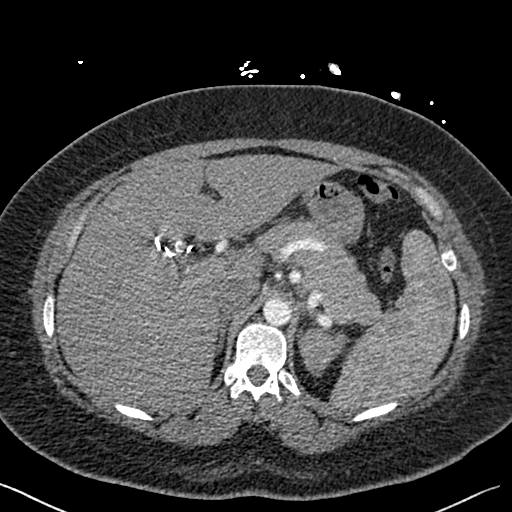
[im 54/309  lung]
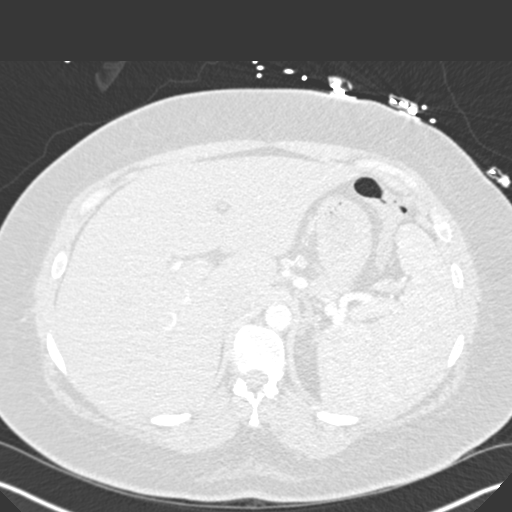
[im 67/309  soft-tissue]
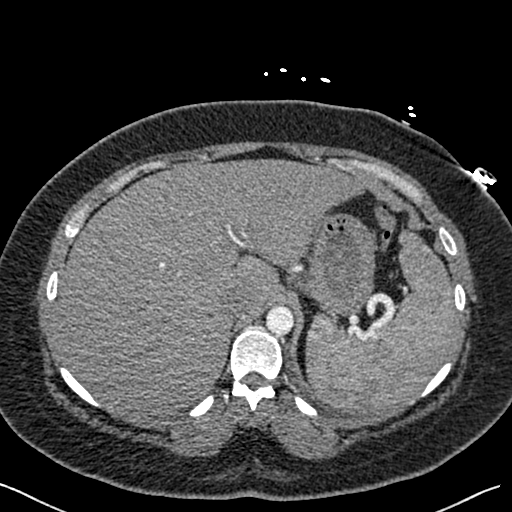
[im 94/309  lung]
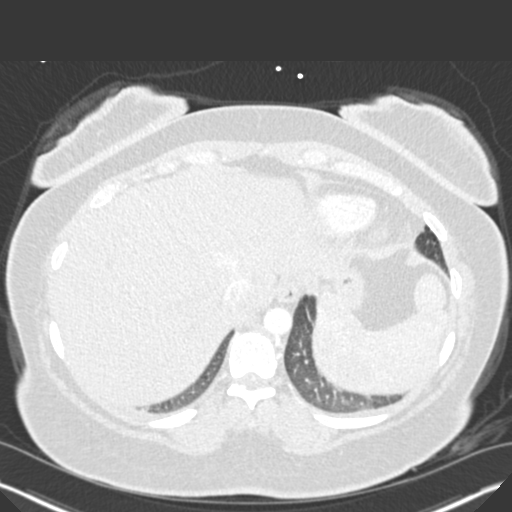
[im 108/309  soft-tissue]
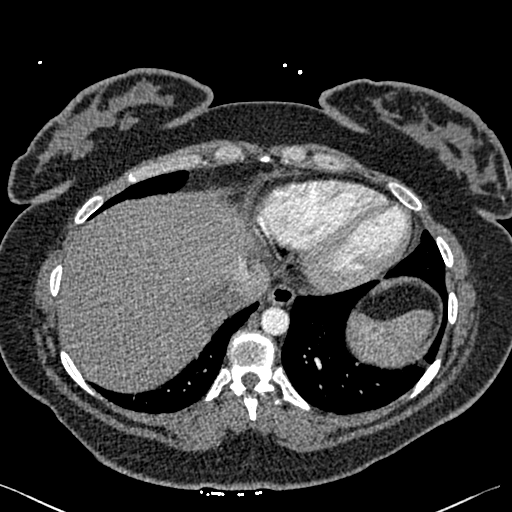
[im 121/309  lung]
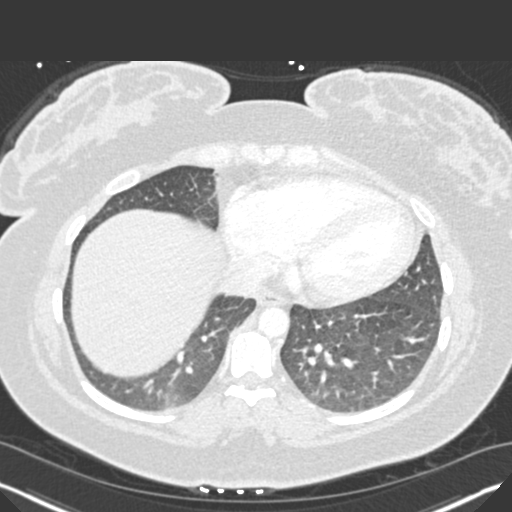
[im 148/309  soft-tissue]
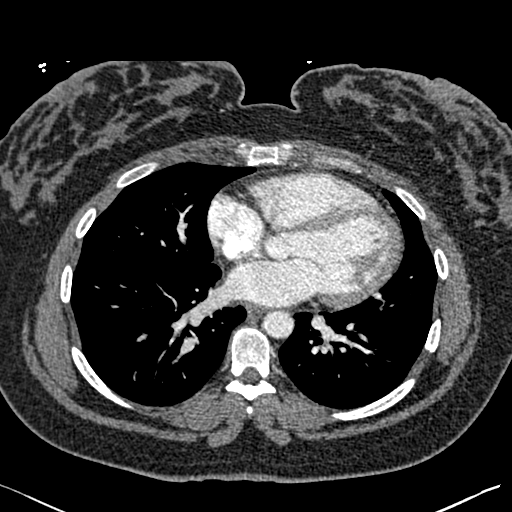
[im 161/309  lung]
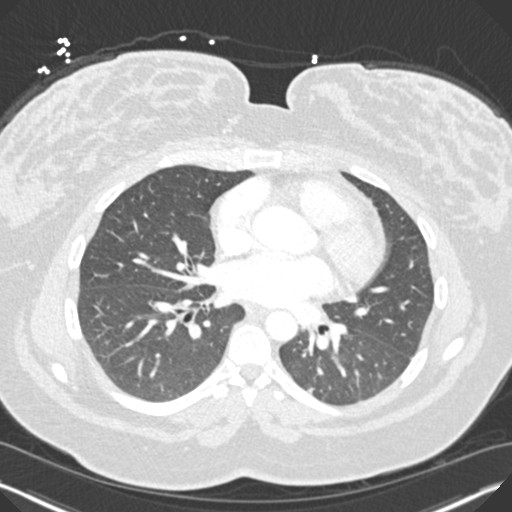
[im 188/309  soft-tissue]
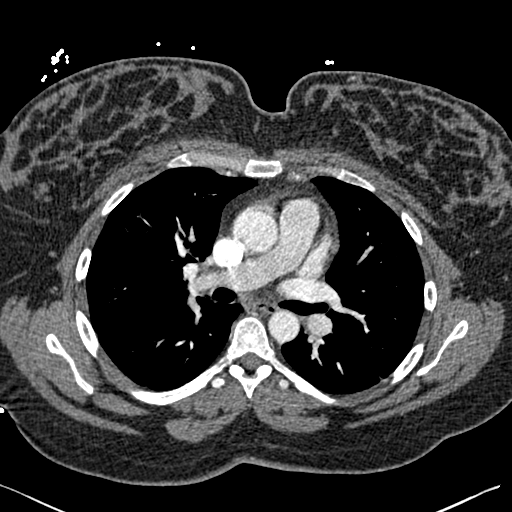
[im 201/309  lung]
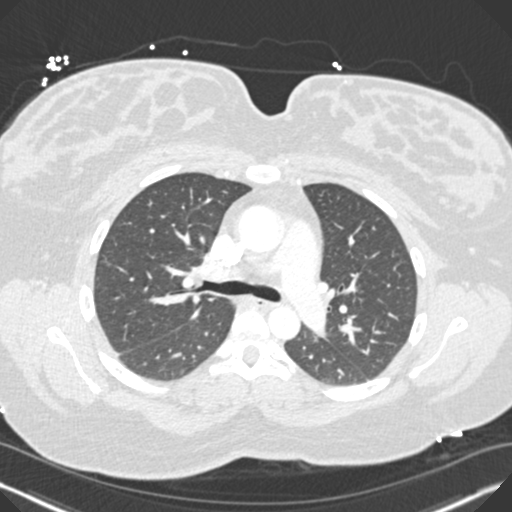
[im 215/309  soft-tissue]
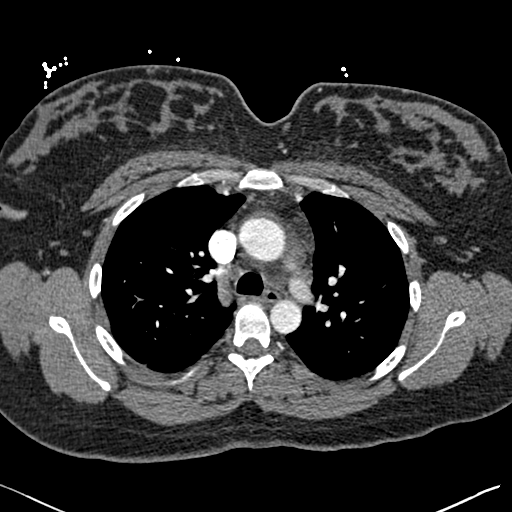
[im 242/309  lung]
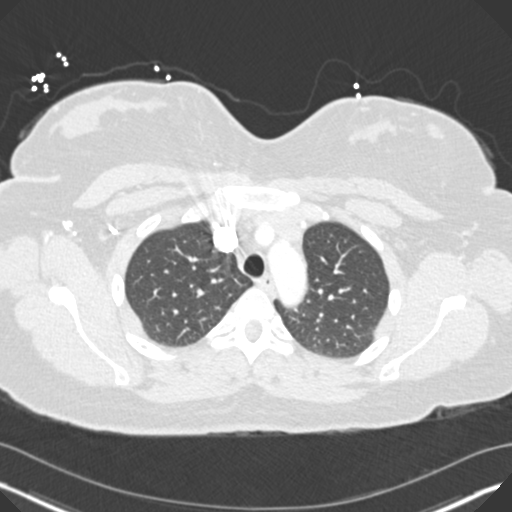
[im 255/309  soft-tissue]
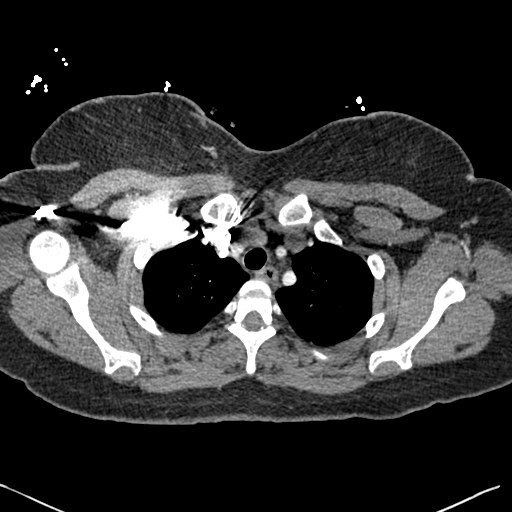
[im 268/309  lung]
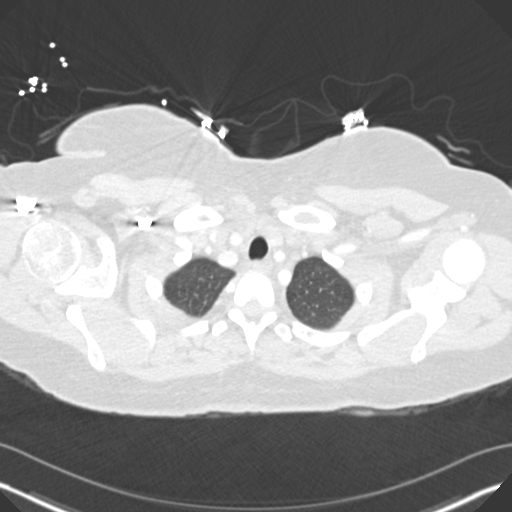
[im 295/309  soft-tissue]
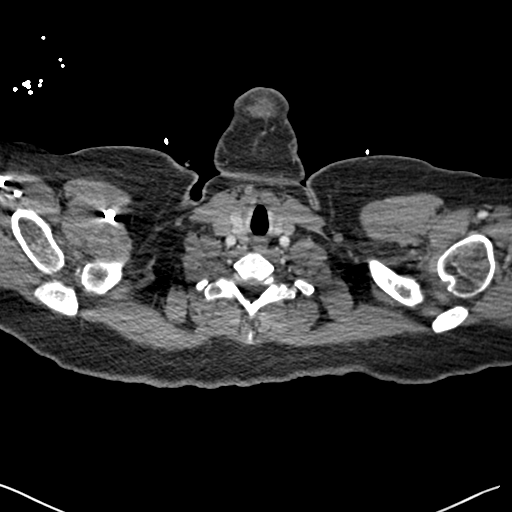

[Series 7: coronal mpr · coronal · 0.65mm/px · 3 of 133 slices shown]
[im 34/133  soft-tissue]
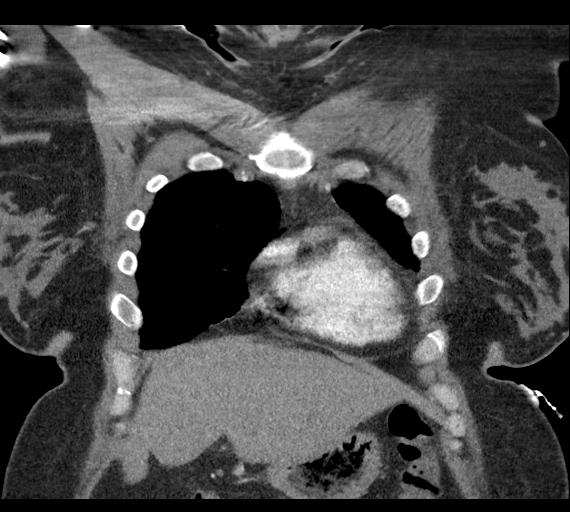
[im 67/133  soft-tissue]
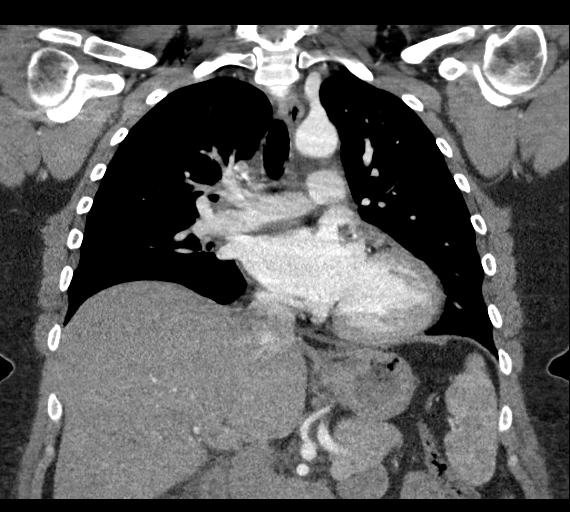
[im 100/133  soft-tissue]
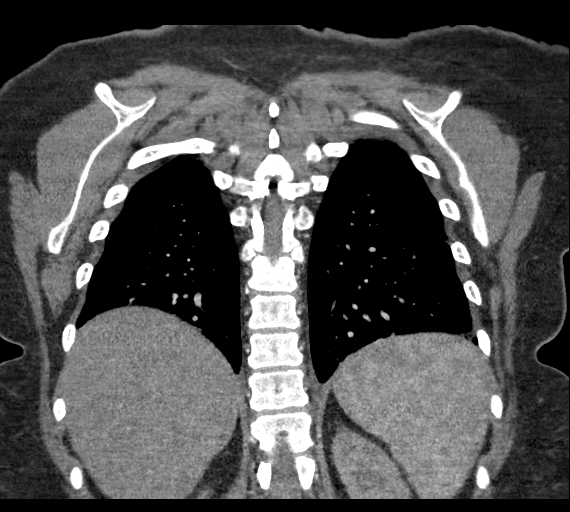

[19 of 46 positions shown; findings below may reference images not displayed]

FINDINGS: Cardiovascular: Although the pulmonary arteries are not optimally
opacified at the subsegmental level, the study is felt to be
adequate to exclude significant pulmonary embolism. No evidence of
right heart strain. The heart size is normal. No pericardial fluid.
The thoracic aorta is well opacified and is of normal caliber. No
evidence of aortic dissection.

Mediastinum/Nodes: No enlarged mediastinal, hilar, or axillary lymph
nodes. Thyroid gland, trachea, and esophagus demonstrate no
significant findings.

Lungs/Pleura: Mild atelectasis at the lung bases bilaterally. There
is no evidence of pulmonary edema, consolidation, pneumothorax,
nodule or pleural fluid.

Upper Abdomen: No acute abnormality.

Musculoskeletal: No chest wall abnormality. No acute or significant
osseous findings.

Review of the MIP images confirms the above findings.
IMPRESSION: No evidence of pulmonary embolism, aortic pathology or other acute
findings. Mild bibasilar atelectasis present.

## 2017-12-09 ENCOUNTER — Ambulatory Visit (INDEPENDENT_AMBULATORY_CARE_PROVIDER_SITE_OTHER): Payer: Medicaid Other | Admitting: Adult Health

## 2017-12-09 ENCOUNTER — Encounter: Payer: Self-pay | Admitting: Adult Health

## 2017-12-09 VITALS — BP 120/74 | HR 90 | Ht 64.0 in | Wt 261.0 lb

## 2017-12-09 DIAGNOSIS — Z30011 Encounter for initial prescription of contraceptive pills: Secondary | ICD-10-CM

## 2017-12-09 DIAGNOSIS — Z309 Encounter for contraceptive management, unspecified: Secondary | ICD-10-CM

## 2017-12-09 DIAGNOSIS — Z01419 Encounter for gynecological examination (general) (routine) without abnormal findings: Secondary | ICD-10-CM | POA: Insufficient documentation

## 2017-12-09 DIAGNOSIS — Z3009 Encounter for other general counseling and advice on contraception: Secondary | ICD-10-CM | POA: Insufficient documentation

## 2017-12-09 DIAGNOSIS — Z113 Encounter for screening for infections with a predominantly sexual mode of transmission: Secondary | ICD-10-CM | POA: Diagnosis not present

## 2017-12-09 LAB — POCT URINE PREGNANCY: Preg Test, Ur: NEGATIVE

## 2017-12-09 MED ORDER — NORETHIN-ETH ESTRAD-FE BIPHAS 1 MG-10 MCG / 10 MCG PO TABS: 1.0000 | ORAL_TABLET | Freq: Every day | ORAL | 11 refills | Status: DC

## 2017-12-09 NOTE — Progress Notes (Signed)
Patient ID: Audrey [name], female   DOB: 1986-02-11, 32 y.o.   MRN: [id number] History of Present Illness:  Audrey [name] is a 32 year old white female,married G5P4, in for well woman gyn exam, she had normal pap with negative HPV 08/24/15.She is still breastfeeding son, but plans to wean, he will be 1 soon.She wants OCs, and wants adipex, when stopped breastfeeding,has used in past with good results. PCP is Dr Abran CantorFrye.   Current Medications, Allergies, Past Medical History, Past Surgical History, Family History and Social History were reviewed in [facility] electronic medical record.     Review of Systems: Patient denies any headaches, hearing loss, fatigue, blurred vision, shortness of breath, chest pain, abdominal pain, problems with bowel movements, urination, or intercourse. No joint pain or mood swings. +weight gain    Physical Exam:BP 120/74 (BP Location: Left Arm, Patient Position: Sitting, Cuff Size: Normal)   Pulse 90   Ht 5\' 4"  (1.626 m)   Wt 261 lb (118.4 kg)   Breastfeeding? Yes   BMI 44.80 kg/m  UPT negative.  General:  Well developed, well nourished, no acute distress Skin:  Warm and dry Neck:  Midline trachea, normal thyroid, good ROM, no lymphadenopathy Lungs; Clear to auscultation bilaterally Breast:  No dominant palpable mass, retraction, or nipple discharge,+breast milk Cardiovascular: Regular rate and rhythm Abdomen:  Soft, non tender, no hepatosplenomegaly Pelvic:  External genitalia is normal in appearance, no lesions.  The vagina is normal in appearance. Urethra has no lesions or masses. The cervix is bulbous.  Uterus is felt to be normal size, shape, and contour.  No adnexal masses or tenderness noted.Bladder is non tender, no masses felt. GC/CHL obtained. Extremities/musculoskeletal:  No swelling or varicosities noted, no clubbing or cyanosis Psych:  No mood changes, alert and cooperative,seems happy PHQ 2 score 0.  Impression: 1. Encounter for well woman exam  with routine gynecological exam   2. Family planning   3. Screening examination for STD (sexually transmitted disease)   4. Encounter for initial prescription of contraceptive pills       Plan: GC/CHL sent Check HIV and RPR Meds ordered this encounter  Medications  . Norethindrone-Ethinyl Estradiol-Fe Biphas (LO LOESTRIN FE) 1 MG-10 MCG / 10 MCG tablet    Sig: Take 1 tablet by mouth daily. Take 1 daily by mouth    Dispense:  1 Package    Refill:  11    BIN [id number], PCN [id number], GRP [id number],ID [id number]    Order Specific Question:   Supervising Provider    Answer:   Lazaro ArmsEURE, LUTHER H [[id number]]  Given 1 pack Lo loestrin to start today,use condoms Pap and physical in 1 year Call when totally stopped breastfeeding for adipex Rx.

## 2017-12-10 LAB — RPR: RPR: NONREACTIVE

## 2017-12-10 LAB — HIV ANTIBODY (ROUTINE TESTING W REFLEX): HIV Screen 4th Generation wRfx: NONREACTIVE

## 2017-12-11 LAB — GC/CHLAMYDIA PROBE AMP
Chlamydia trachomatis, NAA: NEGATIVE
Neisseria gonorrhoeae by PCR: NEGATIVE

## 2017-12-13 IMAGING — US US RENAL
1 series · 14 of 25 positions shown · non-contrast
Comparison: None.

CLINICAL DATA: Chronic UTI starting May 2016, chronic kidney
disease, nephrolithiasis, 22 weeks pregnant

EXAM:
RENAL / URINARY TRACT ULTRASOUND COMPLETE

[Series 1: us renal · 0.20mm/px · 14 of 38 slices shown]
[im 1/38]
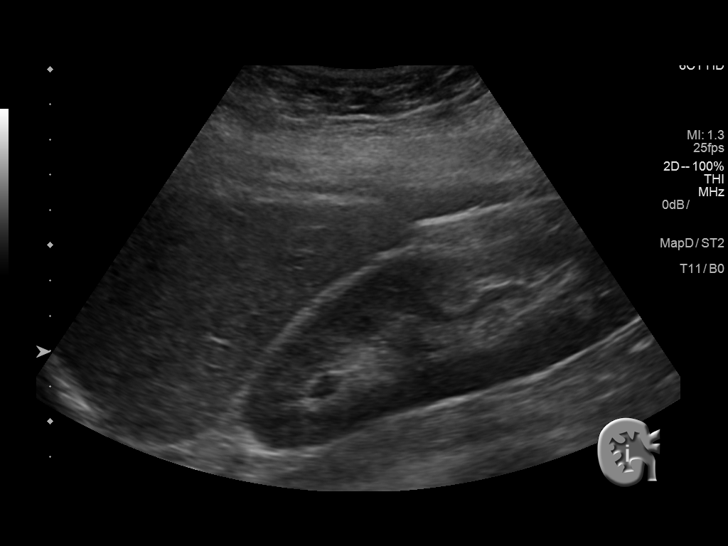
[im 4/38]
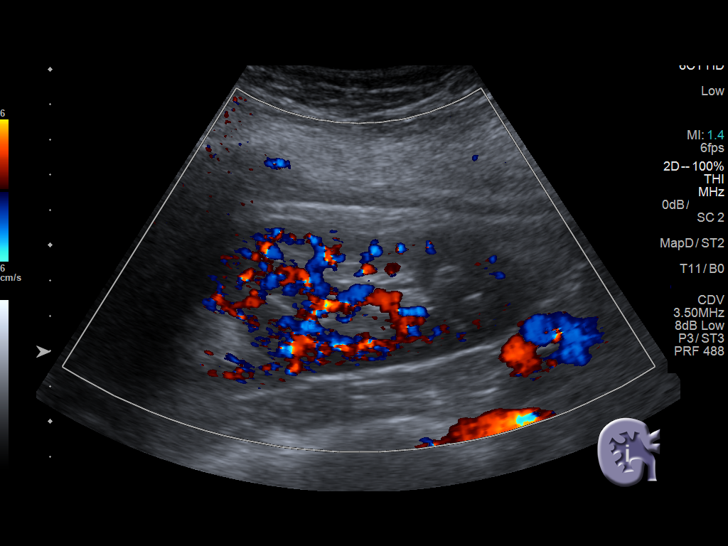
[im 7/38]
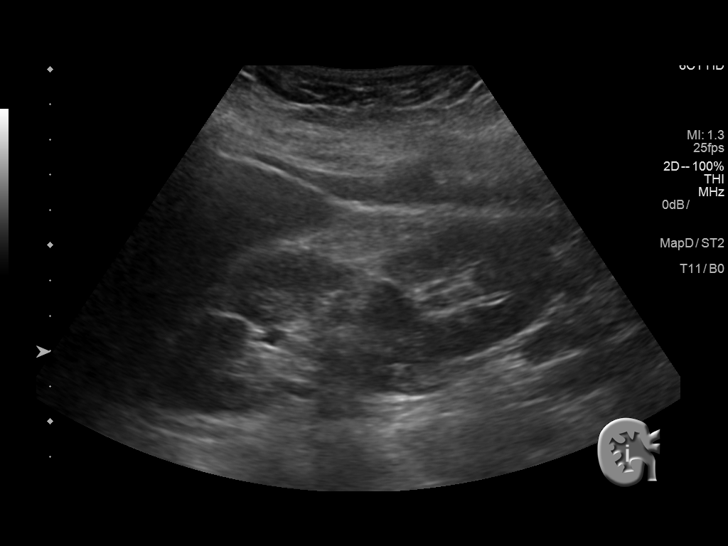
[im 10/38]
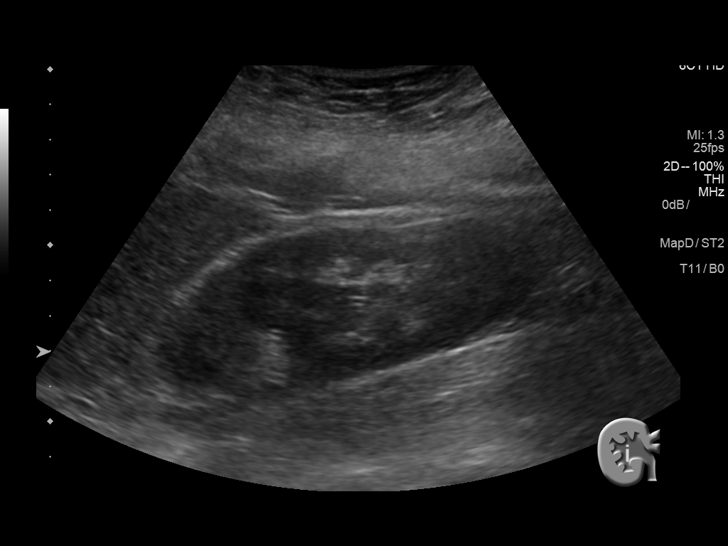
[im 13/38]
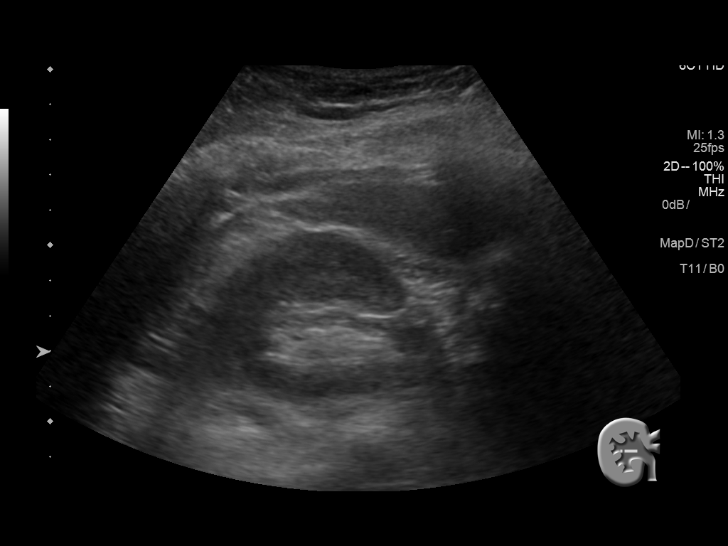
[im 14/38]
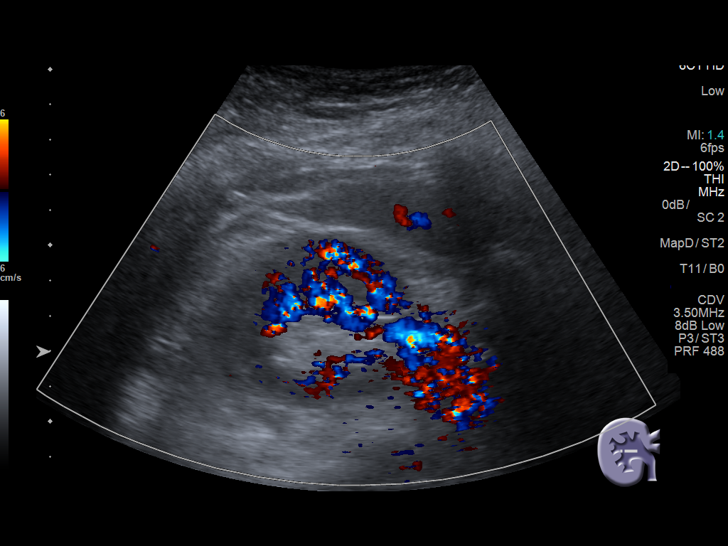
[im 17/38]
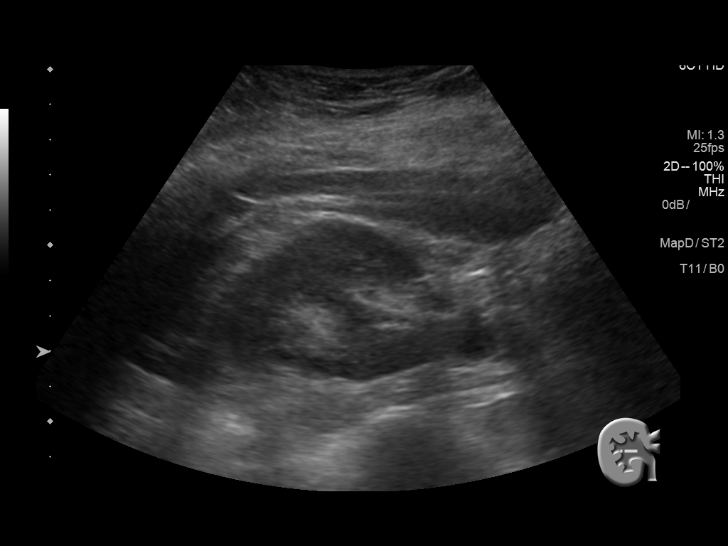
[im 21/38]
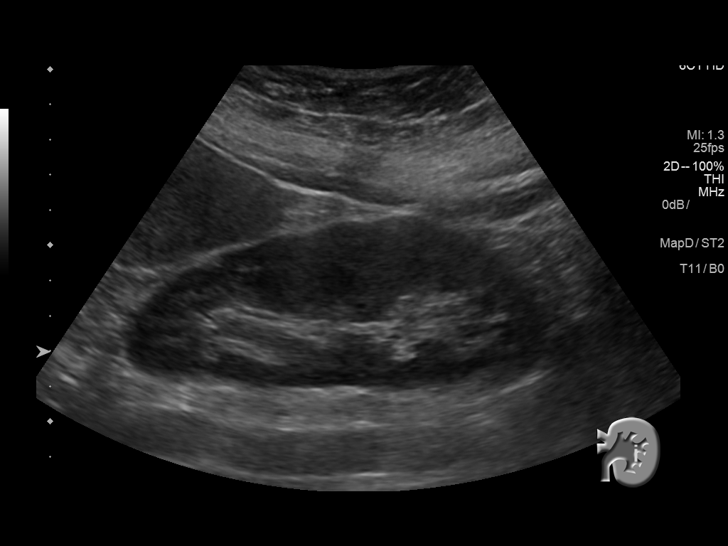
[im 24/38]
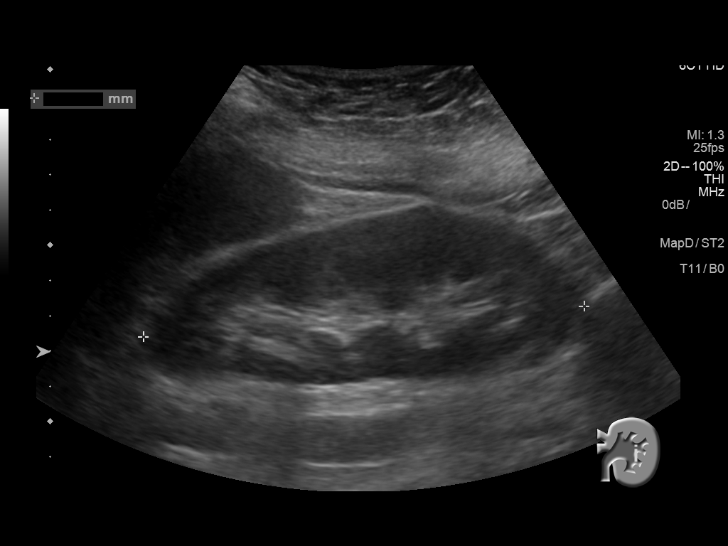
[im 25/38]
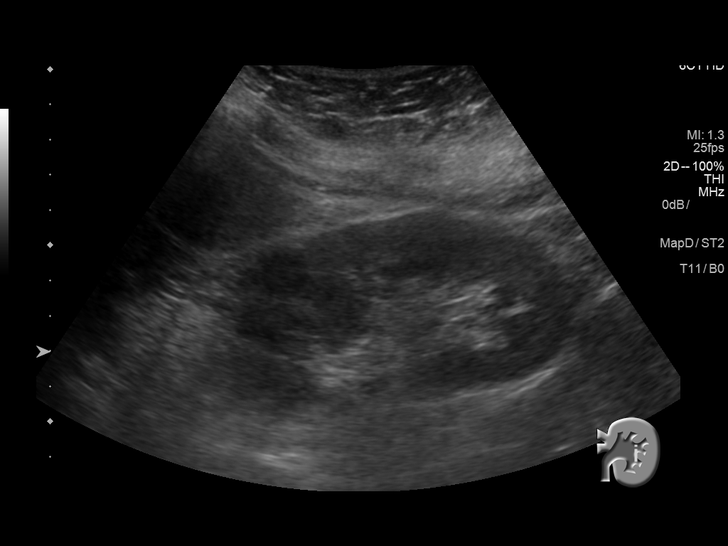
[im 28/38]
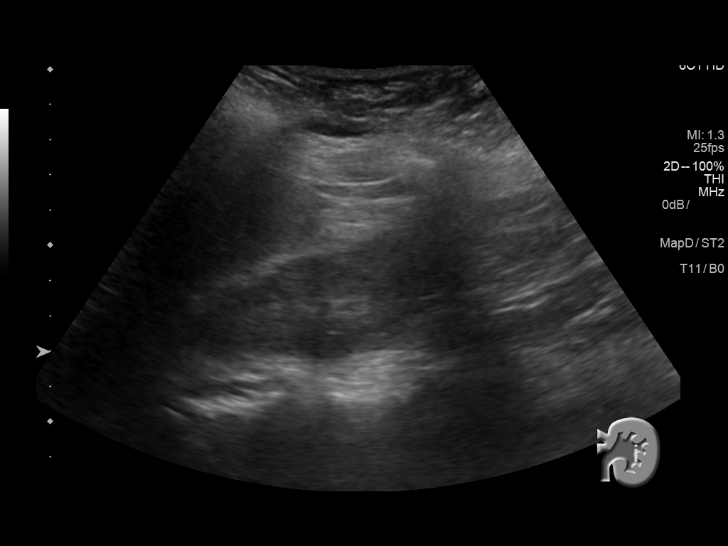
[im 31/38]
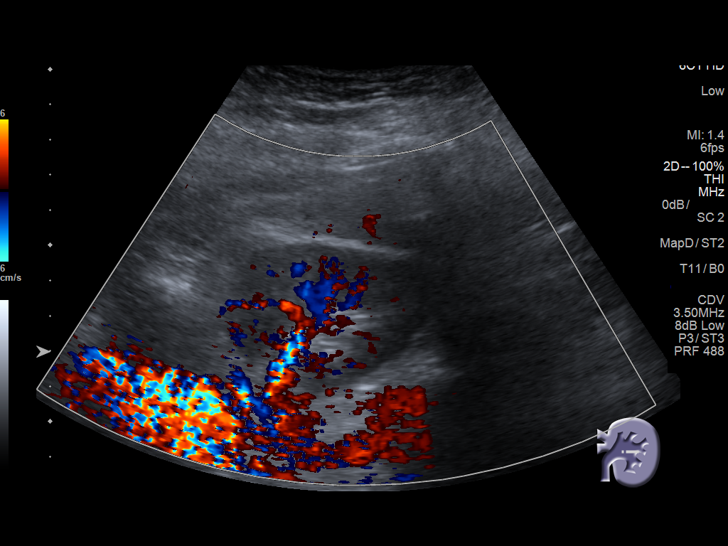
[im 34/38]
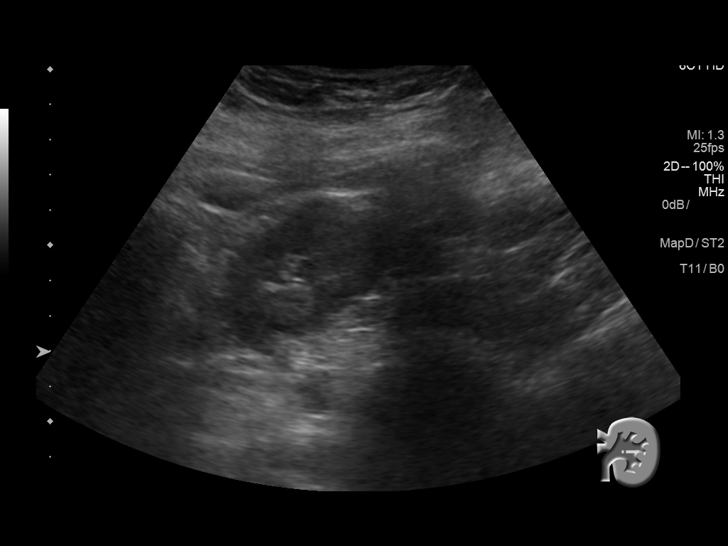
[im 38/38]
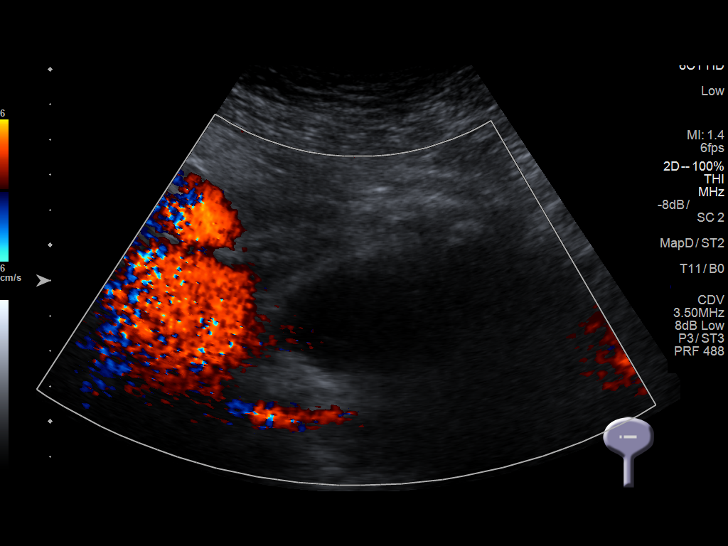

[14 of 25 positions shown; findings below may reference images not displayed]

FINDINGS: Right Kidney:

Length: 12.3 cm. Echogenicity within normal limits. No mass or
hydronephrosis visualized.

Left Kidney:

Length: 12.3 cm. Echogenicity within normal limits. No mass or
hydronephrosis visualized. Bilateral renal calculi.

Bladder:

Limited visualization of the urinary bladder due to intrauterine
pregnancy at 22 weeks. Visualized bladder is grossly unremarkable.
No ureteral jets are visualized.
IMPRESSION: No hydronephrosis or renal calculi. No ureteral jets are visualized.

## 2018-01-14 ENCOUNTER — Telehealth: Payer: Self-pay | Admitting: Adult Health

## 2018-01-14 MED ORDER — PHENTERMINE HCL 37.5 MG PO CAPS: 37.5000 mg | ORAL_CAPSULE | ORAL | 0 refills | Status: DC

## 2018-01-14 NOTE — Telephone Encounter (Signed)
Pt has stopped breastfeeding and wants adipex, will rx, for weight check in about 4 weeks

## 2018-02-17 ENCOUNTER — Ambulatory Visit: Payer: Medicaid Other | Admitting: Adult Health

## 2018-03-13 ENCOUNTER — Ambulatory Visit: Payer: Medicaid Other | Admitting: Adult Health

## 2018-04-02 ENCOUNTER — Ambulatory Visit: Payer: Self-pay | Admitting: Adult Health

## 2018-05-08 ENCOUNTER — Ambulatory Visit: Payer: Self-pay | Admitting: Adult Health

## 2018-09-17 ENCOUNTER — Telehealth: Payer: Self-pay | Admitting: Adult Health

## 2018-09-17 MED ORDER — HYDROXYZINE HCL 25 MG PO TABS: 25.0000 mg | ORAL_TABLET | Freq: Three times a day (TID) | ORAL | 1 refills | Status: DC | PRN

## 2018-09-17 NOTE — Telephone Encounter (Signed)
Pt wants to see if what she took in 2018 for anxiety for a dental procedure can be sent in for her again.

## 2018-09-17 NOTE — Telephone Encounter (Signed)
Spoke with pt. Pt's mom had a massive stroke and is coming to live with her next week so she don't have to go to a nursing home. Pt feels super overwhelmed. Pt was prescribed an anxiety med for a dental procedure in 2018 and she was calm with that. She is unsure of the name of the med.  Pt was wondering if you could prescribe that again. She was prescribed Atarax in 2018. Thanks!! [name]

## 2018-09-17 NOTE — Telephone Encounter (Signed)
Pt requesting meds to help keep her from being anxious, mom had stroke and is moving in,pt is not breastfeeding and requests meds she took before dental procedure, which was vistaril, will Rx vistaril

## 2018-09-18 ENCOUNTER — Telehealth: Payer: Self-pay | Admitting: Adult Health

## 2018-09-18 ENCOUNTER — Other Ambulatory Visit: Payer: Self-pay

## 2018-09-18 ENCOUNTER — Emergency Department (HOSPITAL_COMMUNITY): Payer: Self-pay

## 2018-09-18 ENCOUNTER — Encounter (HOSPITAL_COMMUNITY): Payer: Self-pay | Admitting: Emergency Medicine

## 2018-09-18 ENCOUNTER — Emergency Department (HOSPITAL_COMMUNITY)
Admission: EM | Admit: 2018-09-18 | Discharge: 2018-09-18 | Disposition: A | Discharge status: Home / Self Care | Payer: Self-pay | Attending: Emergency Medicine | Admitting: Emergency Medicine

## 2018-09-18 DIAGNOSIS — N939 Abnormal uterine and vaginal bleeding, unspecified: Secondary | ICD-10-CM | POA: Insufficient documentation

## 2018-09-18 DIAGNOSIS — R102 Pelvic and perineal pain: Secondary | ICD-10-CM

## 2018-09-18 DIAGNOSIS — R109 Unspecified abdominal pain: Secondary | ICD-10-CM | POA: Insufficient documentation

## 2018-09-18 LAB — URINALYSIS, ROUTINE W REFLEX MICROSCOPIC
Bacteria, UA: NONE SEEN
Bilirubin Urine: NEGATIVE
Glucose, UA: NEGATIVE mg/dL
Ketones, ur: NEGATIVE mg/dL
Leukocytes,Ua: NEGATIVE
Nitrite: NEGATIVE
Protein, ur: NEGATIVE mg/dL
RBC / HPF: >50 RBC/hpf — ABNORMAL HIGH (ref 0–5)
Specific Gravity, Urine: 1.025 (ref 1.005–1.030)
pH: 5 (ref 5.0–8.0)

## 2018-09-18 LAB — CBC
HCT: 39.7 % (ref 36.0–46.0)
Hemoglobin: 13.3 g/dL (ref 12.0–15.0)
MCH: 29.8 pg (ref 26.0–34.0)
MCHC: 33.5 g/dL (ref 30.0–36.0)
MCV: 89 fL (ref 80.0–100.0)
Platelets: 330 10*3/uL (ref 150–400)
RBC: 4.46 MIL/uL (ref 3.87–5.11)
RDW: 12.9 % (ref 11.5–15.5)
WBC: 7.8 10*3/uL (ref 4.0–10.5)
nRBC: 0 % (ref 0.0–0.2)

## 2018-09-18 LAB — BASIC METABOLIC PANEL
Anion gap: 8 (ref 5–15)
BUN: 13 mg/dL (ref 6–20)
CO2: 23 mmol/L (ref 22–32)
Calcium: 8.6 mg/dL — ABNORMAL LOW (ref 8.9–10.3)
Chloride: 106 mmol/L (ref 98–111)
Creatinine, Ser: 0.8 mg/dL (ref 0.44–1.00)
GFR calc Af Amer: >60 mL/min (ref >60)
GFR calc non Af Amer: >60 mL/min (ref >60)
Glucose, Bld: 103 mg/dL — ABNORMAL HIGH (ref 70–99)
Potassium: 3.7 mmol/L (ref 3.5–5.1)
Sodium: 137 mmol/L (ref 135–145)

## 2018-09-18 LAB — PREGNANCY, URINE: Preg Test, Ur: NEGATIVE

## 2018-09-18 MED ORDER — ACETAMINOPHEN 500 MG PO TABS
1000.0000 mg | ORAL_TABLET | Freq: Once | ORAL | Status: AC
  Administered 2018-09-18: 1000 mg via ORAL
  Filled 2018-09-18: qty 2

## 2018-09-18 NOTE — ED Provider Notes (Signed)
Blood pressure 98/68, pulse 89, temperature 97.6 F (36.4 C), temperature source Oral, resp. rate 20, height 5\' 5"  (1.651 m), weight 100.7 kg, last menstrual period 09/13/2018, SpO2 99 %, currently breastfeeding.  Assuming care from Dr. Jodi Mourning.  In short, Audrey [name] is a 33 y.o. female with a chief complaint of Vaginal Bleeding .  Refer to the original H&P for additional details.  The current plan of care is to follow up on labs and TVUS.  04:00 PM  Ultrasound reviewed and discussed with patient.  No acute findings.  Lab work normal including negative pregnancy test and no acute anemia.  Patient is typically regular with her cycles.  She would prefer to not be started on OCPs at this time.  She will discuss with her OB/GYN as an outpatient.  Discussed ED return precautions.    Maia Plan, MD 09/18/18 [phone]

## 2018-09-18 NOTE — Telephone Encounter (Signed)
Spoke with pt. Pt started having "labor pains" in abd all the way across. Heating pad is helping some. + nausea from pain. Period was supposed to start around 4/10 and she started on 4/19. It has been from spotting to heavy and then stopping to heavy. I spoke with JAG and left pt a message to go to [facility] ER for eval. [name]

## 2018-09-18 NOTE — ED Triage Notes (Signed)
Patient states she started her menstrual period late and is having intermittent heavy vaginal bleeding. States she called PCP and was told to go to ER. Also complaining of pain to lower abdomen.

## 2018-09-18 NOTE — ED Provider Notes (Signed)
[facility] EMERGENCY DEPARTMENT Provider Note   CSN: [id number] Arrival date & time: 09/18/18  1239    History   Chief Complaint Chief Complaint  Patient presents with  . Vaginal Bleeding    HPI Audrey [name] is a 33 y.o. female.     Patient presents with vaginal bleeding and worsening cramping bilateral lower pelvis.  This is different than she is experienced in the past.  Were central.  Patient is not concerned for STDs no new sexual partners no vaginal discharge.  Initially was intermittent mild spotting and worsened today.  Patient has been stressed recently with her mother having a large stroke and being hospitalized.  Patient is not currently pregnant.  Patient has a history of for conceives, 1 miscarriage.  No ectopic pregnancy history.     Past Medical History:  Diagnosis Date  . Allergy   . Anxiety   . BV (bacterial vaginosis) 12/30/2012  . Chicken pox   . Chronic kidney disease   . Contraceptive management 07/15/2013  . Hay fever   . History of kidney stones   . Irregular bleeding 12/30/2012  . Leukorrhea 10/07/2013   resolved s/p cryotherapy    . Migraines   . PONV (postoperative nausea and vomiting)   . Postcoital bleeding 05/13/2013   Had [place] and IUD in place, bleeding stopped after doxycycline but started back, has normal period lite x 2 days but bleeds every time with sex, is bright red and has clots and cramps after sex   . UTI (lower urinary tract infection) 08/24/2014  . UTI (urinary tract infection)     Patient Active Problem List   Diagnosis Date Noted  . Encounter for well woman exam with routine gynecological exam 12/09/2017  . Family planning 12/09/2017  . Screening examination for STD (sexually transmitted disease) 12/09/2017  . Encounter for initial prescription of contraceptive pills 12/09/2017  . SVD (spontaneous vaginal delivery) 01/01/2017  . Pre-eclampsia 12/31/2016  . Normal labor 12/31/2016  . Preterm labor 12/04/2016  . History of  preterm delivery, currently pregnant 06/17/2016  . History of cryosurgery of cervix affecting pregnancy 06/17/2016  . BMI 40.0-44.9, adult (HCC) 06/15/2016  . Pyelonephritis first trimester, then recurrent UTI 06/15/2016  . Attention deficit hyperactivity disorder (ADHD) 04/24/2015  . BV (bacterial vaginosis) 12/30/2012  . Anxiety 06/08/2012  . Migraines 09/25/2010    Past Surgical History:  Procedure Laterality Date  . CHOLECYSTECTOMY N/A 03/09/2014   Procedure: LAPAROSCOPIC CHOLECYSTECTOMY;  Surgeon: Dalia Heading, MD;  Location: [facility];  Service: General;  Laterality: N/A;  . DILATION AND CURETTAGE OF UTERUS    . TONSILLECTOMY       OB History    Gravida  5   Para  4   Term  3   Preterm  1   AB  1   Living  3     SAB  1   TAB      Ectopic      Multiple      Live Births  3            Home Medications    Prior to Admission medications   Medication Sig Start Date End Date Taking? Authorizing Provider  acetaminophen (TYLENOL) 500 MG tablet Take 500 mg by mouth every 6 (six) hours as needed for mild pain, moderate pain, fever or headache.     [provider]  flintstones complete (FLINTSTONES) 60 MG chewable tablet Chew 2 tablets by mouth daily.  [provider]  hydrOXYzine (ATARAX/VISTARIL) 25 MG tablet Take 1 tablet (25 mg total) by mouth every 8 (eight) hours as needed. 09/17/18   Adline PotterGriffin, Jennifer A, NP  Norethindrone-Ethinyl Estradiol-Fe Biphas (LO LOESTRIN FE) 1 MG-10 MCG / 10 MCG tablet Take 1 tablet by mouth daily. Take 1 daily by mouth 12/09/17   Adline PotterGriffin, Jennifer A, NP  phentermine 37.5 MG capsule Take 1 capsule (37.5 mg total) by mouth every morning. 01/14/18   Adline PotterGriffin, Jennifer A, NP    Family History Family History  Problem Relation Age of Onset  . Arthritis Mother   . Lung cancer Mother   . Hyperlipidemia Mother   . Atrial fibrillation Mother   . Stroke Mother   . Hypertension Mother   . Diabetes Father   . Dementia  Father   . Breast cancer Maternal Aunt   . Diabetes Paternal Uncle     Social History Social History   Tobacco Use  . Smoking status: Never Smoker  . Smokeless tobacco: Never Used  Substance Use Topics  . Alcohol use: No    Alcohol/week: 0.0 standard drinks  . Drug use: No     Allergies   Bee venom; Codeine; Penicillins; Tape; and Bactrim [sulfamethoxazole-trimethoprim]   Review of Systems Review of Systems  Constitutional: Negative for chills and fever.  HENT: Negative for congestion.   Eyes: Negative for visual disturbance.  Respiratory: Negative for shortness of breath.   Cardiovascular: Negative for chest pain.  Gastrointestinal: Positive for abdominal pain and nausea. Negative for vomiting.  Genitourinary: Positive for vaginal bleeding. Negative for dysuria, flank pain and vaginal discharge.  Musculoskeletal: Negative for back pain, neck pain and neck stiffness.  Skin: Negative for rash.  Neurological: Negative for light-headedness and headaches.     Physical Exam Updated Vital Signs BP 98/68   Pulse 89   Temp 97.6 F (36.4 C) (Oral)   Resp 20   Ht 5\' 5"  (1.651 m)   Wt 100.7 kg   LMP 09/13/2018   SpO2 99%   BMI 36.94 kg/m   Physical Exam Vitals signs and nursing note reviewed.  Constitutional:      Appearance: She is well-developed.  HENT:     Head: Normocephalic and atraumatic.  Eyes:     General:        Right eye: No discharge.        Left eye: No discharge.     Conjunctiva/sclera: Conjunctivae normal.  Neck:     Musculoskeletal: Normal range of motion and neck supple.     Trachea: No tracheal deviation.  Cardiovascular:     Rate and Rhythm: Normal rate and regular rhythm.  Pulmonary:     Effort: Pulmonary effort is normal.     Breath sounds: Normal breath sounds.  Abdominal:     General: There is no distension.     Palpations: Abdomen is soft.     Tenderness: There is abdominal tenderness (mild suprapubic). There is no guarding.  Skin:     General: Skin is warm.     Findings: No rash.  Neurological:     Mental Status: She is alert and oriented to person, place, and time.      ED Treatments / Results  Labs (all labs ordered are listed, but only abnormal results are displayed) Labs Reviewed  URINALYSIS, ROUTINE W REFLEX MICROSCOPIC - Abnormal; Notable for the following components:      Result Value   APPearance HAZY (*)    Hgb urine dipstick  LARGE (*)    RBC / HPF >50 (*)    All other components within normal limits  PREGNANCY, URINE  CBC  BASIC METABOLIC PANEL    EKG None  Radiology No results found.  Procedures Procedures (including critical care time)  Medications Ordered in ED Medications  acetaminophen (TYLENOL) tablet 1,000 mg (1,000 mg Oral Given 09/18/18 1336)     Initial Impression / Assessment and Plan / ED Course  I have reviewed the triage vital signs and the nursing notes.  Pertinent labs & imaging results that were available during my care of the patient were reviewed by me and considered in my medical decision making (see chart for details).       Patient presents with lower abdominal cramping and vaginal bleeding.  Concern for possible cyst rupture versus more significant menstrual cycle than normal versus abnormal pregnancy versus other.  Urinalysis pending.  Plan for blood work, ultrasound, pain meds and reassessment. Pt declining pelvic exam at this time.   Delay with blood work.  Patient care will be signed out to follow up results and reassess for final disposition.  Final Clinical Impressions(s) / ED Diagnoses   Final diagnoses:  Vaginal bleeding  Abdominal cramping    ED Discharge Orders    None       Blane Ohara, MD 09/18/18 1431

## 2018-09-18 NOTE — Discharge Instructions (Addendum)
Follow up with your OBGYN next week. Return to the ED with any new or worsening symptoms.

## 2018-09-18 NOTE — Telephone Encounter (Signed)
Patient's mother-in-law called, stated that the patient is in severe pain, abdominal bleeding, really bad cramping x 45 minutes, stated it feels like labor pain, but she's not pregnant.  They want to speak to someone right away, they are trying to determine if she needs to go to the ER.  [phone]

## 2018-10-23 ENCOUNTER — Encounter: Payer: Self-pay | Admitting: *Deleted

## 2018-10-26 ENCOUNTER — Ambulatory Visit (INDEPENDENT_AMBULATORY_CARE_PROVIDER_SITE_OTHER): Payer: Self-pay | Admitting: Adult Health

## 2018-10-26 ENCOUNTER — Other Ambulatory Visit: Payer: Self-pay

## 2018-10-26 ENCOUNTER — Encounter: Payer: Self-pay | Admitting: Adult Health

## 2018-10-26 VITALS — BP 137/90 | HR 96 | Ht 65.0 in | Wt 278.0 lb

## 2018-10-26 DIAGNOSIS — K580 Irritable bowel syndrome with diarrhea: Secondary | ICD-10-CM

## 2018-10-26 DIAGNOSIS — Z6841 Body Mass Index (BMI) 40.0 and over, adult: Secondary | ICD-10-CM

## 2018-10-26 DIAGNOSIS — Z713 Dietary counseling and surveillance: Secondary | ICD-10-CM

## 2018-10-26 DIAGNOSIS — F419 Anxiety disorder, unspecified: Secondary | ICD-10-CM

## 2018-10-26 MED ORDER — BUSPIRONE HCL 7.5 MG PO TABS: 7.5000 mg | ORAL_TABLET | Freq: Two times a day (BID) | ORAL | 3 refills | Status: DC

## 2018-10-26 MED ORDER — DICYCLOMINE HCL 10 MG PO CAPS: 10.0000 mg | ORAL_CAPSULE | Freq: Three times a day (TID) | ORAL | 1 refills | Status: DC

## 2018-10-26 MED ORDER — PHENTERMINE HCL 37.5 MG PO CAPS: 37.5000 mg | ORAL_CAPSULE | ORAL | 0 refills | Status: DC

## 2018-10-26 NOTE — Progress Notes (Signed)
Patient ID: Audrey [name], female   DOB: April 13, 1986, 33 y.o.   MRN: [id number] History of Present Illness: Audrey [name] is a 33 year old white female, married,G5P3113, in with several complaints, weight gain recently and diarrhea after eating or drinking, has had GB removed several years ago. She is requesting to try phentermine, has taken in the past. Anxiety, her mom died 10/31/2018 and she was with her.  PCP is Dr Clent Ridges.   Current Medications, Allergies, Past Medical History, Past Surgical History, Family History and Social History were reviewed in [employer] [facility] record.     Review of Systems: Has gained weight while mom was sick, but her mom died 31-Oct-2018 Has ?IBS-D, will get expulsive diarrhea after eating, sometimes just drinking water, no pain Has chronic pain in back, and had some swelling in right hand and leg     Physical Exam:BP 137/90 (BP Location: Left Arm, Patient Position: Sitting, Cuff Size: Normal)   Pulse 96   Ht 5\' 5"  (1.651 m)   Wt 278 lb (126.1 kg)   LMP 10/22/2018 (Exact Date)   Breastfeeding No   BMI 46.26 kg/m  General:  Well developed, well nourished, no acute distress Skin:  Warm and dry Lungs; Clear to auscultation bilaterally Cardiovascular: Regular rate and rhythm Psych:  No mood changes, alert and cooperative,seems happy, but anxious, and she is open to taking meds daily. Fall risk is low.  PHQ 2 score 0. Will rx buspar for anxiety and recommended grief counseling at [facility].   Impression and Plan: 1. Weight loss counseling, encounter for -will Rx adipex -decrease carbs and salt -increase activity Meds ordered this encounter  Medications  . phentermine 37.5 MG capsule    Sig: Take 1 capsule (37.5 mg total) by mouth every morning.    Dispense:  30 capsule    Refill:  0    Order Specific Question:   Supervising Provider    Answer:   Despina Hidden, LUTHER H [[id number]]  . busPIRone (BUSPAR) 7.5 MG tablet    Sig: Take 1 tablet (7.5 mg  total) by mouth 2 (two) times daily.    Dispense:  60 tablet    Refill:  3    Order Specific Question:   Supervising Provider    Answer:   Despina Hidden, LUTHER H [[id number]]  . dicyclomine (BENTYL) 10 MG capsule    Sig: Take 1 capsule (10 mg total) by mouth 4 (four) times daily -  before meals and at bedtime.    Dispense:  120 capsule    Refill:  1    Order Specific Question:   Supervising Provider    Answer:   Duane Lope H [[id number]]  -Follow up in 4 weeks for weight and BP check and ROS See Dr Clent Ridges about back   2. BMI 45.0-49.9, adult (HCC)   3. Anxiety -will Rx buspar, recommend grief counseling   4. Irritable bowel syndrome with diarrhea - will Rx bentyl to see if helps with diarrhea after eating or drinking

## 2018-11-25 ENCOUNTER — Ambulatory Visit: Payer: Medicaid Other | Admitting: Adult Health

## 2018-12-22 ENCOUNTER — Telehealth: Payer: Self-pay | Admitting: Adult Health

## 2018-12-22 NOTE — Telephone Encounter (Signed)
Left message @ 4:44 pm. [name] 

## 2018-12-22 NOTE — Telephone Encounter (Signed)
Pt would like to see if her buspar can be changed to something else that is still mild but maybe she can take every other day. She states the buspar is not helping.

## 2018-12-23 ENCOUNTER — Ambulatory Visit: Payer: Medicaid Other | Admitting: Adult Health

## 2018-12-23 NOTE — Telephone Encounter (Signed)
Mail box full @ 5:12 pm. Unable to leave message. Encounter closed. [place]

## 2019-03-01 ENCOUNTER — Ambulatory Visit: Payer: Medicaid Other | Admitting: Adult Health

## 2019-03-22 ENCOUNTER — Inpatient Hospital Stay (HOSPITAL_COMMUNITY)
Admission: EM | Admit: 2019-03-22 | Discharge: 2019-03-25 | DRG: 194 | Disposition: A | Discharge status: Home / Self Care | Payer: Self-pay | Attending: Family Medicine | Admitting: Family Medicine

## 2019-03-22 ENCOUNTER — Encounter (HOSPITAL_COMMUNITY): Payer: Self-pay

## 2019-03-22 ENCOUNTER — Telehealth (INDEPENDENT_AMBULATORY_CARE_PROVIDER_SITE_OTHER): Payer: Self-pay | Admitting: Family Medicine

## 2019-03-22 ENCOUNTER — Emergency Department (HOSPITAL_COMMUNITY): Payer: Self-pay

## 2019-03-22 ENCOUNTER — Other Ambulatory Visit: Payer: Self-pay

## 2019-03-22 DIAGNOSIS — N12 Tubulo-interstitial nephritis, not specified as acute or chronic: Secondary | ICD-10-CM | POA: Diagnosis present

## 2019-03-22 DIAGNOSIS — Z20828 Contact with and (suspected) exposure to other viral communicable diseases: Secondary | ICD-10-CM

## 2019-03-22 DIAGNOSIS — E876 Hypokalemia: Secondary | ICD-10-CM | POA: Diagnosis present

## 2019-03-22 DIAGNOSIS — R0902 Hypoxemia: Secondary | ICD-10-CM | POA: Diagnosis present

## 2019-03-22 DIAGNOSIS — Z8249 Family history of ischemic heart disease and other diseases of the circulatory system: Secondary | ICD-10-CM

## 2019-03-22 DIAGNOSIS — J181 Lobar pneumonia, unspecified organism: Principal | ICD-10-CM | POA: Diagnosis present

## 2019-03-22 DIAGNOSIS — D72810 Lymphocytopenia: Secondary | ICD-10-CM | POA: Diagnosis present

## 2019-03-22 DIAGNOSIS — R5383 Other fatigue: Secondary | ICD-10-CM

## 2019-03-22 DIAGNOSIS — Z87440 Personal history of urinary (tract) infections: Secondary | ICD-10-CM

## 2019-03-22 DIAGNOSIS — R230 Cyanosis: Secondary | ICD-10-CM

## 2019-03-22 DIAGNOSIS — R509 Fever, unspecified: Secondary | ICD-10-CM

## 2019-03-22 DIAGNOSIS — J189 Pneumonia, unspecified organism: Secondary | ICD-10-CM

## 2019-03-22 DIAGNOSIS — R0602 Shortness of breath: Secondary | ICD-10-CM

## 2019-03-22 LAB — CBC WITH DIFFERENTIAL/PLATELET
Abs Immature Granulocytes: 0 10*3/uL (ref 0.00–0.07)
Basophils Absolute: 0 10*3/uL (ref 0.0–0.1)
Basophils Relative: 0 %
Eosinophils Absolute: 0 10*3/uL (ref 0.0–0.5)
Eosinophils Relative: 1 %
HCT: 42.5 % (ref 36.0–46.0)
Hemoglobin: 13.7 g/dL (ref 12.0–15.0)
Immature Granulocytes: 0 %
Lymphocytes Relative: 29 %
Lymphs Abs: 0.6 10*3/uL — ABNORMAL LOW (ref 0.7–4.0)
MCH: 28 pg (ref 26.0–34.0)
MCHC: 32.2 g/dL (ref 30.0–36.0)
MCV: 86.9 fL (ref 80.0–100.0)
Monocytes Absolute: 0.2 10*3/uL (ref 0.1–1.0)
Monocytes Relative: 9 %
Neutro Abs: 1.2 10*3/uL — ABNORMAL LOW (ref 1.7–7.7)
Neutrophils Relative %: 61 %
Platelets: 226 10*3/uL (ref 150–400)
RBC: 4.89 MIL/uL (ref 3.87–5.11)
RDW: 13 % (ref 11.5–15.5)
WBC: 1.9 10*3/uL — ABNORMAL LOW (ref 4.0–10.5)
nRBC: 0 % (ref 0.0–0.2)

## 2019-03-22 LAB — POC URINE PREG, ED: Preg Test, Ur: NEGATIVE

## 2019-03-22 LAB — URINALYSIS, ROUTINE W REFLEX MICROSCOPIC
Bilirubin Urine: NEGATIVE
Glucose, UA: NEGATIVE mg/dL
Hgb urine dipstick: NEGATIVE
Ketones, ur: 5 mg/dL — AB
Nitrite: POSITIVE — AB
Protein, ur: 30 mg/dL — AB
Specific Gravity, Urine: 1.029 (ref 1.005–1.030)
pH: 5 (ref 5.0–8.0)

## 2019-03-22 LAB — BASIC METABOLIC PANEL
Anion gap: 11 (ref 5–15)
BUN: 14 mg/dL (ref 6–20)
CO2: 22 mmol/L (ref 22–32)
Calcium: 8.2 mg/dL — ABNORMAL LOW (ref 8.9–10.3)
Chloride: 103 mmol/L (ref 98–111)
Creatinine, Ser: 0.77 mg/dL (ref 0.44–1.00)
GFR calc Af Amer: >60 mL/min (ref >60)
GFR calc non Af Amer: >60 mL/min (ref >60)
Glucose, Bld: 95 mg/dL (ref 70–99)
Potassium: 3.4 mmol/L — ABNORMAL LOW (ref 3.5–5.1)
Sodium: 136 mmol/L (ref 135–145)

## 2019-03-22 MED ORDER — SODIUM CHLORIDE 0.9 % IV BOLUS
1000.0000 mL | Freq: Once | INTRAVENOUS | Status: AC
  Administered 2019-03-22: 21:00:00 1000 mL via INTRAVENOUS

## 2019-03-22 MED ORDER — IOHEXOL 350 MG/ML SOLN
100.0000 mL | Freq: Once | INTRAVENOUS | Status: AC | PRN
  Administered 2019-03-22: 100 mL via INTRAVENOUS

## 2019-03-22 MED ORDER — ONDANSETRON 4 MG PO TBDP
4.0000 mg | ORAL_TABLET | Freq: Once | ORAL | Status: AC
  Administered 2019-03-22: 4 mg via ORAL
  Filled 2019-03-22: qty 1

## 2019-03-22 MED ORDER — IBUPROFEN 400 MG PO TABS
400.0000 mg | ORAL_TABLET | Freq: Once | ORAL | Status: AC
  Administered 2019-03-23: 400 mg via ORAL
  Filled 2019-03-22: qty 1

## 2019-03-22 MED ORDER — ALBUTEROL SULFATE HFA 108 (90 BASE) MCG/ACT IN AERS
4.0000 | INHALATION_SPRAY | Freq: Once | RESPIRATORY_TRACT | Status: AC
  Administered 2019-03-22: 4 via RESPIRATORY_TRACT
  Filled 2019-03-22: qty 6.7

## 2019-03-22 MED ORDER — NITROFURANTOIN MONOHYD MACRO 100 MG PO CAPS
100.0000 mg | ORAL_CAPSULE | Freq: Once | ORAL | Status: AC
  Administered 2019-03-22: 100 mg via ORAL
  Filled 2019-03-22: qty 1

## 2019-03-22 MED ORDER — SODIUM CHLORIDE 0.9 % IV BOLUS
1000.0000 mL | Freq: Once | INTRAVENOUS | Status: AC
  Administered 2019-03-22: 1000 mL via INTRAVENOUS

## 2019-03-22 MED ORDER — LEVOFLOXACIN IN D5W 750 MG/150ML IV SOLN
750.0000 mg | Freq: Once | INTRAVENOUS | Status: AC
  Administered 2019-03-23: 750 mg via INTRAVENOUS
  Filled 2019-03-22: qty 150

## 2019-03-22 NOTE — ED Notes (Signed)
Ambulated pt in room. Pt remained at 97% however HR went from 107 to 124. Said that she felt like her heart was racing.

## 2019-03-22 NOTE — ED Notes (Addendum)
Called in to room by pt. IVF fluids finished. Pt ambulated to the restroom. Upon arrival back to room pt c/o SOB  "just cant catch my breath" 100% on room air. HR 130

## 2019-03-22 NOTE — ED Notes (Signed)
Ambulated pt after first bag of fluids. Pt resting heart rate 89. Post ambulation elevated to 125. edp notified. Second bag of fluids ordered and given

## 2019-03-22 NOTE — ED Notes (Signed)
Pt given instructions on how to use inhaler

## 2019-03-22 NOTE — ED Triage Notes (Signed)
Pt reports that her fever has been fluctuating since last Wednesday. Reports she has had a HA for 2 weeks . Pt tested negative for covid on Friday.

## 2019-03-22 NOTE — ED Notes (Signed)
edp in room  

## 2019-03-22 NOTE — ED Provider Notes (Signed)
[facility] EMERGENCY DEPARTMENT Provider Note   CSN: [id number] Arrival date & time: 03/22/19  1412     History   Chief Complaint Chief Complaint  Patient presents with   Audrey   Headache    HPI AARIA HAPP is a 33 y.o. female presenting with a now 6 day history of progressively worsening non productive cough with chest [name], Audrey [name] hours, along with generalized frontal headache, nausea with emesis (but controlled with zofran) and diarrhea.  She had positive direct exposure to Covid 19 early last week with coworkers, but her Covid test collected 5 days ago was negative.  She denies neck pain or stiffness, abdominal pain, sore throat, rash, denies dysuria.  She endorses cold chills when Audrey is elevated, but does respond to ibuprofen.  Last dose taken 1 pm today 800 mg. Also denies skin lesions, redness, no tick exposures. No recent travel.       The history is provided by the patient.  Audrey Associated symptoms: cough, diarrhea, headaches, myalgias, nausea and vomiting   Associated symptoms: no rash   Headache Associated symptoms: cough, diarrhea, Audrey, myalgias, nausea and vomiting   Associated symptoms: no neck pain and no neck stiffness     Past Medical History:  Diagnosis Date   Allergy    Anxiety    BV (bacterial vaginosis) 12/30/2012   Chicken pox    Chronic kidney disease    Contraceptive management 07/15/2013   Hay Audrey    History of kidney stones    Irregular bleeding 12/30/2012   Leukorrhea 10/07/2013   resolved s/p cryotherapy     Migraines    PONV (postoperative nausea and vomiting)    Postcoital bleeding 05/13/2013   Had [place] and IUD in place, bleeding stopped after doxycycline but started back, has normal period lite x 2 days but bleeds every time with sex, is bright red and has clots and cramps after sex    UTI (lower urinary tract infection) 08/24/2014   UTI (urinary tract infection)       Patient Active Problem List   Diagnosis Date Noted   BMI 45.0-49.9, adult (HCC) 10/26/2018   Weight loss counseling, encounter for 10/26/2018   Irritable bowel syndrome with diarrhea 10/26/2018   Encounter for well woman exam with routine gynecological exam 12/09/2017   Family planning 12/09/2017   Screening examination for STD (sexually transmitted disease) 12/09/2017   Encounter for initial prescription of contraceptive pills 12/09/2017   SVD (spontaneous vaginal delivery) 01/01/2017   Pre-eclampsia 12/31/2016   Normal labor 12/31/2016   Preterm labor 12/04/2016   History of preterm delivery, currently pregnant 06/17/2016   History of cryosurgery of cervix affecting pregnancy 06/17/2016   BMI 40.0-44.9, adult (HCC) 06/15/2016   Pyelonephritis first trimester, then recurrent UTI 06/15/2016   Attention deficit hyperactivity disorder (ADHD) 04/24/2015   BV (bacterial vaginosis) 12/30/2012   Anxiety 06/08/2012   Migraines 09/25/2010    Past Surgical History:  Procedure Laterality Date   CHOLECYSTECTOMY N/A 03/09/2014   Procedure: LAPAROSCOPIC CHOLECYSTECTOMY;  Surgeon: Dalia Heading, MD;  Location: [facility];  Service: General;  Laterality: N/A;   DILATION AND CURETTAGE OF UTERUS     TONSILLECTOMY       OB History    Gravida  5   Para  4   Term  3   Preterm  1   AB  1   Living  3     SAB  1   TAB      Ectopic      Multiple      Live Births  3            Home Medications    Prior to Admission medications   Medication Sig Start Date End Date Taking? Authorizing Provider  ibuprofen (ADVIL) 200 MG tablet Take 200 mg by mouth every 6 (six) hours as needed for mild pain or moderate pain.   Yes [provider]    Family History Family History  Problem Relation Age of Onset   Arthritis Mother    Lung cancer Mother    Hyperlipidemia Mother    Atrial fibrillation Mother    Stroke Mother    Hypertension Mother     Diabetes Father    Dementia Father    Breast cancer Maternal Aunt    Diabetes Paternal Uncle     Social History Social History   Tobacco Use   Smoking status: Never Smoker   Smokeless tobacco: Never Used  Substance Use Topics   Alcohol use: No    Alcohol/week: 0.0 standard drinks   Drug use: No     Allergies   Bee venom, Codeine, Penicillins, Tape, and Bactrim [sulfamethoxazole-trimethoprim]   Review of Systems Review of Systems  Constitutional: Positive for Audrey.  HENT: Negative.   Respiratory: Positive for cough, chest tightness and shortness of breath. Negative for wheezing.   Gastrointestinal: Positive for diarrhea, nausea and vomiting.  Musculoskeletal: Positive for myalgias. Negative for neck pain and neck stiffness.       Does endorse chronic neck pain from prior trauma, not worsened with this illness.  Skin: Negative.  Negative for rash.  Neurological: Positive for headaches.     Physical Exam Updated Vital Signs BP 132/90    Pulse 93    Temp 98.4 F (36.9 C) (Oral)    Resp 17    Ht  (1.651 m)    Wt 108.9 kg    LMP 03/08/2019    SpO2 99%    BMI 39.94 kg/m   Physical Exam Vitals signs and nursing note reviewed.  Constitutional:      General: She is not in acute distress.    Appearance: She is well-developed.  HENT:     Head: Normocephalic and atraumatic.     Mouth/Throat:     Mouth: Mucous membranes are moist.  Eyes:     Conjunctiva/sclera: Conjunctivae normal.  Neck:     Musculoskeletal: Normal range of motion and neck supple. No neck rigidity.  Cardiovascular:     Rate and Rhythm: Regular rhythm. Tachycardia present.     Heart sounds: Normal heart sounds.  Pulmonary:     Effort: Pulmonary effort is normal. No respiratory distress.     Breath sounds: Normal breath sounds. No wheezing, rhonchi or rales.  Abdominal:     General: Bowel sounds are normal. There is no distension.     Palpations: Abdomen is soft.     Tenderness: There  is no abdominal tenderness.  Musculoskeletal: Normal range of motion.  Skin:    General: Skin is warm and dry.  Neurological:     Mental Status: She is alert.      ED Treatments / Results  Labs (all labs ordered are listed, but only abnormal results are displayed) Labs Reviewed  URINALYSIS, ROUTINE W REFLEX MICROSCOPIC - Abnormal; Notable for the following components:      Result Value   APPearance CLOUDY (*)  Ketones, ur 5 (*)    Protein, ur 30 (*)    Nitrite POSITIVE (*)    Leukocytes,Ua MODERATE (*)    Bacteria, UA RARE (*)    All other components within normal limits  CBC WITH DIFFERENTIAL/PLATELET - Abnormal; Notable for the following components:   WBC 1.9 (*)    Neutro Abs 1.2 (*)    Lymphs Abs 0.6 (*)    All other components within normal limits  BASIC METABOLIC PANEL - Abnormal; Notable for the following components:   Potassium 3.4 (*)    Calcium 8.2 (*)    All other components within normal limits  SARS CORONAVIRUS 2 (TAT 6-24 HRS)  URINE CULTURE  POC URINE PREG, ED    EKG None  Radiology Ct Angio Chest Pe W And/or Wo Contrast  Result Date: 03/22/2019 CLINICAL DATA:  Audrey. Shortness of breath. EXAM: CT ANGIOGRAPHY CHEST WITH CONTRAST TECHNIQUE: Multidetector CT imaging of the chest was performed using the standard protocol during bolus administration of intravenous contrast. Multiplanar CT image reconstructions and MIPs were obtained to evaluate the vascular anatomy. CONTRAST:  100mL OMNIPAQUE IOHEXOL 350 MG/ML SOLN COMPARISON:  01/06/2017 FINDINGS: Cardiovascular: Contrast injection is sufficient to demonstrate satisfactory opacification of the pulmonary arteries to the segmental level. There is no pulmonary embolus. The main pulmonary artery is within normal limits for size. There is no CT evidence of acute right heart strain. The visualized aorta is normal. Heart size is normal, without pericardial effusion. Mediastinum/Nodes: --No mediastinal or hilar  lymphadenopathy. --No axillary lymphadenopathy. --No supraclavicular lymphadenopathy. --Normal thyroid gland. --The esophagus is unremarkable Lungs/Pleura: There is consolidation throughout the right lower lobe and to a lesser degree the right upper lobe. The left lung field is clear. There is no significant pleural effusion. The trachea is unremarkable. Upper Abdomen: No acute abnormality. Musculoskeletal: No chest wall abnormality. No acute or significant osseous findings. Review of the MIP images confirms the above findings. IMPRESSION: 1. Multifocal pneumonia involving the right lower lobe and right upper lobe. 2. No evidence for pulmonary embolus. Electronically Signed   By: Katherine Mantlehristopher  Green M.D.   On: 03/22/2019 23:03   Dg Chest Portable 1 View  Result Date: 03/22/2019 CLINICAL DATA:  Audrey, cough, shortness of breath EXAM: PORTABLE CHEST 1 VIEW COMPARISON:  01/06/2017 FINDINGS: The heart size and mediastinal contours are within normal limits. Both lungs are clear. The visualized skeletal structures are unremarkable. IMPRESSION: No active disease. Electronically Signed   By: Duanne GuessNicholas  Plundo M.D.   On: 03/22/2019 17:30    Procedures Procedures (including critical care time)  Medications Ordered in ED Medications  ibuprofen (ADVIL) tablet 400 mg (has no administration in time range)  levofloxacin (LEVAQUIN) IVPB 750 mg (has no administration in time range)  ondansetron (ZOFRAN-ODT) disintegrating tablet 4 mg (4 mg Oral Given 03/22/19 1707)  sodium chloride 0.9 % bolus 1,000 mL (0 mLs Intravenous Stopped 03/22/19 2032)  nitrofurantoin (macrocrystal-monohydrate) (MACROBID) capsule 100 mg (100 mg Oral Given 03/22/19 1931)  sodium chloride 0.9 % bolus 1,000 mL (0 mLs Intravenous Stopped 03/22/19 2150)  albuterol (VENTOLIN HFA) 108 (90 Base) MCG/ACT inhaler 4 puff (4 puffs Inhalation Given 03/22/19 2207)  iohexol (OMNIPAQUE) 350 MG/ML injection 100 mL (100 mLs Intravenous Contrast Given  03/22/19 2243)     Initial Impression / Assessment and Plan / ED Course  I have reviewed the triage vital signs and the nursing notes.  Pertinent labs & imaging results that were available during my care of the patient were reviewed  by me and considered in my medical decision making (see chart for details).        Pt with febrile illness with sob, n/v/d with worsening fatigue, positive Covid exposure but negative Covid result completed 5 days ago.  Labs reviewed, chest xray reviewed, no acute findings except for uti (pt denies sx, but reports h/o asymptomatic uti in the past).  She was given macrobid here.  Also given IV fluids given her tachycardia, n/v/d, suspected dehydration. As her stay progressed, pt became more  Sob, ambulated to bathroom, lightheaded, tachypneic, continued tachycardia despite IV fluids.  CT chest obtained to rule out PE or other pulmonary pathology.  Right upper and lower multifocal pneumonia. Pt ambulated in room became tachypneic with  o2 sats of 93%.  Covid test today still pending, but suspect this will be positive.  Pt will require admission for supportive care. Levaquin IV ordered to cover for possible bacterial pneumonia (PCN allergic).    Discussed pt with Dr. Myna Hidalgo who agrees with admission. Will see pt in ed.    Final Clinical Impressions(s) / ED Diagnoses   Final diagnoses:  Multifocal pneumonia  Hypoxia  SOB (shortness of breath)    ED Discharge Orders    None       Landis Martins 03/23/19 0007    Margette Fast, MD 03/23/19 1054

## 2019-03-22 NOTE — Progress Notes (Signed)
Virtual Visit via Video Note  I connected with [employer] on 03/22/19 at 10:30 AM EDT by a video enabled telemedicine application 2/2 COVID-19 pandemic and verified that I am speaking with the correct person using two identifiers.  Location patient: home Location provider:work or home office Persons participating in the virtual visit: patient, provider  I discussed the limitations of evaluation and management by telemedicine and the availability of in person appointments. The patient expressed understanding and agreed to proceed.   HPI: Pt is a 33 yo female with pmh sig for IBS, migraines, h/o pyelo during pregnancy, anxiety, ADHD seen by Dr. Clent Ridges.  Pt seen today for acute concerns.   Pt states 2 people in her office tested positive for COVID-19 1 wk ago.  Pt states she has been with a HA x 2 wks.  Last wk she developed fatigue, fever-tmax 103F, chills, blue tint to lips and fingertips, dry cough (more of a tickle in throat).  Pt "winded" with going up stairs. Taking ibuprofen which helps some.   Pt states daily at 3 or 4 pm her symptoms worsen.   Pt feels like she is having back pain.  Unsure if it is from laying in bed for several days or if she is having another kidney infection.     ROS: See pertinent positives and negatives per HPI.  Past Medical History:  Diagnosis Date  . Allergy   . Anxiety   . BV (bacterial vaginosis) 12/30/2012  . Chicken pox   . Chronic kidney disease   . Contraceptive management 07/15/2013  . Hay fever   . History of kidney stones   . Irregular bleeding 12/30/2012  . Leukorrhea 10/07/2013   resolved s/p cryotherapy    . Migraines   . PONV (postoperative nausea and vomiting)   . Postcoital bleeding 05/13/2013   Had [place] and IUD in place, bleeding stopped after doxycycline but started back, has normal period lite x 2 days but bleeds every time with sex, is bright red and has clots and cramps after sex   . UTI (lower urinary tract infection) 08/24/2014  . UTI  (urinary tract infection)     Past Surgical History:  Procedure Laterality Date  . CHOLECYSTECTOMY N/A 03/09/2014   Procedure: LAPAROSCOPIC CHOLECYSTECTOMY;  Surgeon: Dalia Heading, MD;  Location: [facility];  Service: General;  Laterality: N/A;  . DILATION AND CURETTAGE OF UTERUS    . TONSILLECTOMY      Family History  Problem Relation Age of Onset  . Arthritis Mother   . Lung cancer Mother   . Hyperlipidemia Mother   . Atrial fibrillation Mother   . Stroke Mother   . Hypertension Mother   . Diabetes Father   . Dementia Father   . Breast cancer Maternal Aunt   . Diabetes Paternal Uncle      Current Outpatient Medications:  .  busPIRone (BUSPAR) 7.5 MG tablet, Take 1 tablet (7.5 mg total) by mouth 2 (two) times daily., Disp: 60 tablet, Rfl: 3 .  dicyclomine (BENTYL) 10 MG capsule, Take 1 capsule (10 mg total) by mouth 4 (four) times daily -  before meals and at bedtime., Disp: 120 capsule, Rfl: 1 .  phentermine 37.5 MG capsule, Take 1 capsule (37.5 mg total) by mouth every morning., Disp: 30 capsule, Rfl: 0  EXAM:  VITALS per patient if applicable: RR between 12-20 bpm  GENERAL: alert, oriented, appears well and in no acute distress  HEENT: atraumatic, conjunctiva clear, no obvious abnormalities  on inspection of external nose and ears.  Mild cyanosis of lower lip.  NECK: normal movements of the head and neck  LUNGS: on inspection no signs of respiratory distress, breathing rate appears normal, no obvious gross SOB, gasping or wheezing  CV: Mild cyanosis of lower lip.  MS: moves all visible extremities without noticeable abnormality  PSYCH/NEURO: pleasant and cooperative, no obvious depression or anxiety, speech and thought processing grossly intact  ASSESSMENT AND PLAN:  Discussed the following assessment and plan:  Pt is a 33 yo female pmh sig for IBS, migraines, h/o pyelo during pregnancy, anxiety, ADHD seen for concerning symptoms including cyanosis, fever,  chills, fatigue, HA, cough x 1 wk with recent negative COVID-19 test.  Given nature of symptoms, pt advised to proceed to the nearest ED for further eval including labs, repeat COVID testing, CXR to r/o pneumonia, UA.  Pt, though initially hesitant agrees with plan.  Pt [facility] ED.  Cyanosis  Fever and chills  Fatigue, unspecified type  F/u prn after eval in ED.   I discussed the assessment and treatment plan with the patient. The patient was provided an opportunity to ask questions and all were answered. The patient agreed with the plan and demonstrated an understanding of the instructions.   The patient was advised to call back or seek an in-person evaluation if the symptoms worsen or if the condition fails to improve as anticipated.    Audrey Ruddy, MD

## 2019-03-23 ENCOUNTER — Encounter (HOSPITAL_COMMUNITY): Payer: Self-pay | Admitting: Family Medicine

## 2019-03-23 DIAGNOSIS — J181 Lobar pneumonia, unspecified organism: Secondary | ICD-10-CM | POA: Diagnosis present

## 2019-03-23 DIAGNOSIS — J189 Pneumonia, unspecified organism: Secondary | ICD-10-CM | POA: Diagnosis present

## 2019-03-23 DIAGNOSIS — D72810 Lymphocytopenia: Secondary | ICD-10-CM | POA: Diagnosis present

## 2019-03-23 LAB — CBC WITH DIFFERENTIAL/PLATELET
Abs Immature Granulocytes: 0.01 10*3/uL (ref 0.00–0.07)
Basophils Absolute: 0 10*3/uL (ref 0.0–0.1)
Basophils Relative: 0 %
Eosinophils Absolute: 0 10*3/uL (ref 0.0–0.5)
Eosinophils Relative: 0 %
HCT: 39.1 % (ref 36.0–46.0)
Hemoglobin: 12.4 g/dL (ref 12.0–15.0)
Immature Granulocytes: 1 %
Lymphocytes Relative: 27 %
Lymphs Abs: 0.6 10*3/uL — ABNORMAL LOW (ref 0.7–4.0)
MCH: 28 pg (ref 26.0–34.0)
MCHC: 31.7 g/dL (ref 30.0–36.0)
MCV: 88.3 fL (ref 80.0–100.0)
Monocytes Absolute: 0.1 10*3/uL (ref 0.1–1.0)
Monocytes Relative: 6 %
Neutro Abs: 1.3 10*3/uL — ABNORMAL LOW (ref 1.7–7.7)
Neutrophils Relative %: 66 %
Platelets: 216 10*3/uL (ref 150–400)
RBC: 4.43 MIL/uL (ref 3.87–5.11)
RDW: 13.2 % (ref 11.5–15.5)
WBC: 2 10*3/uL — ABNORMAL LOW (ref 4.0–10.5)
nRBC: 0 % (ref 0.0–0.2)

## 2019-03-23 LAB — BASIC METABOLIC PANEL
Anion gap: 8 (ref 5–15)
BUN: 11 mg/dL (ref 6–20)
CO2: 23 mmol/L (ref 22–32)
Calcium: 7.6 mg/dL — ABNORMAL LOW (ref 8.9–10.3)
Chloride: 106 mmol/L (ref 98–111)
Creatinine, Ser: 0.82 mg/dL (ref 0.44–1.00)
GFR calc Af Amer: >60 mL/min (ref >60)
GFR calc non Af Amer: >60 mL/min (ref >60)
Glucose, Bld: 107 mg/dL — ABNORMAL HIGH (ref 70–99)
Potassium: 3.2 mmol/L — ABNORMAL LOW (ref 3.5–5.1)
Sodium: 137 mmol/L (ref 135–145)

## 2019-03-23 LAB — RESPIRATORY PANEL BY PCR

## 2019-03-23 LAB — HEPATIC FUNCTION PANEL
ALT: 63 U/L — ABNORMAL HIGH (ref 0–44)
AST: 38 U/L (ref 15–41)
Albumin: 3.7 g/dL (ref 3.5–5.0)
Alkaline Phosphatase: 63 U/L (ref 38–126)
Bilirubin, Direct: 0.2 mg/dL (ref 0.0–0.2)
Indirect Bilirubin: 0.3 mg/dL (ref 0.3–0.9)
Total Bilirubin: 0.5 mg/dL (ref 0.3–1.2)
Total Protein: 6.5 g/dL (ref 6.5–8.1)

## 2019-03-23 LAB — SARS CORONAVIRUS 2 (TAT 6-24 HRS): SARS Coronavirus 2: NEGATIVE

## 2019-03-23 LAB — HEPATITIS PANEL, ACUTE
HCV Ab: NONREACTIVE
Hep A IgM: NONREACTIVE
Hep B C IgM: NONREACTIVE
Hepatitis B Surface Ag: NONREACTIVE

## 2019-03-23 LAB — INFLUENZA PANEL BY PCR (TYPE A & B)
Influenza A By PCR: NEGATIVE
Influenza B By PCR: NEGATIVE

## 2019-03-23 LAB — MAGNESIUM: Magnesium: 1.8 mg/dL (ref 1.7–2.4)

## 2019-03-23 LAB — HIV ANTIBODY (ROUTINE TESTING W REFLEX): HIV Screen 4th Generation wRfx: NONREACTIVE

## 2019-03-23 LAB — STREP PNEUMONIAE URINARY ANTIGEN: Strep Pneumo Urinary Antigen: NEGATIVE

## 2019-03-23 LAB — PROCALCITONIN: Procalcitonin: <0.1 ng/mL

## 2019-03-23 MED ORDER — SODIUM CHLORIDE 0.9 % IV SOLN
1.0000 g | Freq: Every day | INTRAVENOUS | Status: DC
  Administered 2019-03-23 – 2019-03-24 (×2): 1 g via INTRAVENOUS
  Filled 2019-03-23 (×2): qty 10

## 2019-03-23 MED ORDER — PROCHLORPERAZINE EDISYLATE 10 MG/2ML IJ SOLN
5.0000 mg | Freq: Once | INTRAMUSCULAR | Status: AC
  Administered 2019-03-23: 5 mg via INTRAVENOUS
  Filled 2019-03-23: qty 2

## 2019-03-23 MED ORDER — ENOXAPARIN SODIUM 40 MG/0.4ML ~~LOC~~ SOLN
40.0000 mg | SUBCUTANEOUS | Status: DC
  Administered 2019-03-23: 40 mg via SUBCUTANEOUS
  Filled 2019-03-23: qty 0.4

## 2019-03-23 MED ORDER — SODIUM CHLORIDE 0.9 % IV SOLN
500.0000 mg | Freq: Every day | INTRAVENOUS | Status: DC
  Administered 2019-03-23 – 2019-03-25 (×2): 500 mg via INTRAVENOUS
  Filled 2019-03-23 (×2): qty 500

## 2019-03-23 MED ORDER — ENOXAPARIN SODIUM 60 MG/0.6ML ~~LOC~~ SOLN
50.0000 mg | SUBCUTANEOUS | Status: DC
  Administered 2019-03-24 – 2019-03-25 (×2): 50 mg via SUBCUTANEOUS
  Filled 2019-03-23 (×2): qty 0.6

## 2019-03-23 MED ORDER — IBUPROFEN 400 MG PO TABS
400.0000 mg | ORAL_TABLET | ORAL | Status: DC | PRN
  Administered 2019-03-23 – 2019-03-24 (×4): 400 mg via ORAL
  Filled 2019-03-23 (×5): qty 1

## 2019-03-23 MED ORDER — ONDANSETRON HCL 4 MG/2ML IJ SOLN
4.0000 mg | Freq: Four times a day (QID) | INTRAMUSCULAR | Status: DC | PRN
  Administered 2019-03-23 – 2019-03-25 (×4): 4 mg via INTRAVENOUS
  Filled 2019-03-23 (×5): qty 2

## 2019-03-23 MED ORDER — SODIUM CHLORIDE 0.9 % IV BOLUS
1000.0000 mL | Freq: Once | INTRAVENOUS | Status: AC
  Administered 2019-03-23: 1000 mL via INTRAVENOUS

## 2019-03-23 MED ORDER — ACETAMINOPHEN 325 MG PO TABS: 650.0000 mg | ORAL_TABLET | Freq: Four times a day (QID) | ORAL | Status: DC | PRN

## 2019-03-23 MED ORDER — POTASSIUM CHLORIDE CRYS ER 20 MEQ PO TBCR
20.0000 meq | EXTENDED_RELEASE_TABLET | Freq: Once | ORAL | Status: AC
  Administered 2019-03-23: 20 meq via ORAL
  Filled 2019-03-23: qty 1

## 2019-03-23 MED ORDER — SODIUM CHLORIDE 0.9% FLUSH: 3.0000 mL | INTRAVENOUS | Status: DC | PRN

## 2019-03-23 MED ORDER — SODIUM CHLORIDE 0.9 % IV SOLN: 250.0000 mL | INTRAVENOUS | Status: DC | PRN

## 2019-03-23 MED ORDER — SODIUM CHLORIDE 0.9% FLUSH
3.0000 mL | Freq: Two times a day (BID) | INTRAVENOUS | Status: DC
  Administered 2019-03-23 – 2019-03-24 (×4): 3 mL via INTRAVENOUS

## 2019-03-23 MED ORDER — DIPHENHYDRAMINE HCL 50 MG/ML IJ SOLN
12.5000 mg | Freq: Once | INTRAMUSCULAR | Status: AC
  Administered 2019-03-23: 15:00:00 12.5 mg via INTRAVENOUS
  Filled 2019-03-23: qty 1

## 2019-03-23 NOTE — ED Notes (Signed)
Patient walked down hall and one round around nurses station back to room.  02 91%.

## 2019-03-23 NOTE — Progress Notes (Signed)
PROGRESS NOTE  Audrey [name] ZOX:[id number] DOB: 03/07/1986 DOA: 03/22/2019 PCP: Nelwyn SalisburyFry, Stephen A, MD  Brief History:  33 year old female with no documented chronic medical problems presenting with 1 week history of fever, cough, malaise, and shortness of breath.  The patient had some nausea without any vomiting.  She also complained of some right flank pain but denied any frank hematuria, dysuria.  She stated that her fevers were up to 101.7 F.  She denied headache, neck pain, hemoptysis, diarrhea, hematochezia, melena.  Patient stated that she has had multiple coworkers with positive Covid tests.  In the emergency department, the patient was afebrile hemodynamically stable albeit with soft blood pressures.  Oxygen saturation was 94% on room air.  The patient was ambulated and became tachycardic and short of breath.  As result, admission was advised.  CT angiogram chest was negative for pulmonary embolus, but showed consolidation right lower lobe and right upper lobe.  Assessment/Plan: Multifocal lobar pneumonia -Continue ceftriaxone and azithromycin -Urine Legionella antigen -Urine Streptococcus pneumonia antigen -Check influenza PCR -SARS-CoV2--neg  Pyuria/flank pain -Concerning for pyelonephritis -Continue ceftriaxone pending culture data -Leg pain is improving  Leukopenia -Suspect this may be due to acute infectious process, possibly viral -Lower respiratory panel -Influenza PCR -am CBC with diff -check RMSF -check Ehrlichia serology  Hypokalemia -replete -check mag -IVF with KCl      Disposition Plan:   Home 10/28 or 10/29 Family Communication:   No Family at bedside  Consultants:  none  Code Status:  FULL  DVT Prophylaxis:  [place] Lovenox   Procedures: As Listed in Progress Note Above  Antibiotics: Ceftriaxone 10/26>>> azithro 10/26>>>       Subjective: Patient denies fevers, chills, headache, chest pain, dyspnea, nausea, vomiting, diarrhea,  abdominal pain, dysuria, hematuria, hematochezia, and melena.   Objective: Vitals:   03/22/19 2200 03/23/19 0100 03/23/19 0200 03/23/19 0757  BP: 132/90 (!) 93/56 91/60 (!) 100/54  Pulse: 93 94 96 65  Resp:   15 19  Temp:    98.1 F (36.7 C)  TempSrc:    Oral  SpO2: 99% 94% 95% 97%  Weight:      Height:       No intake or output data in the 24 hours ending 03/23/19 0937 Weight change:  Exam:   General:  Pt is alert, follows commands appropriately, not in acute distress  HEENT: No icterus, No thrush, No neck mass, [place]/AT  Cardiovascular: RRR, S1/S2, no rubs, no gallops  Respiratory:bilateral rales R>L, no wheeze  Abdomen: Soft/+BS, non tender, non distended, no guarding  Extremities: No edema, No lymphangitis, No petechiae, No rashes, no synovitis   Data Reviewed: I have personally reviewed following labs and imaging studies Basic Metabolic Panel: Recent Labs  Lab 03/22/19 1705 03/23/19 0250  NA 136 137  K 3.4* 3.2*  CL 103 106  CO2 22 23  GLUCOSE 95 107*  BUN 14 11  CREATININE 0.77 0.82  CALCIUM 8.2* 7.6*  MG  --  1.8   Liver Function Tests: No results for input(s): AST, ALT, ALKPHOS, BILITOT, PROT, ALBUMIN in the last 168 hours. No results for input(s): LIPASE, AMYLASE in the last 168 hours. No results for input(s): AMMONIA in the last 168 hours. Coagulation Profile: No results for input(s): INR, PROTIME in the last 168 hours. CBC: Recent Labs  Lab 03/22/19 1705 03/23/19 0250  WBC 1.9* 2.0*  NEUTROABS 1.2* 1.3*  HGB 13.7 12.4  HCT 42.5  39.1  MCV 86.9 88.3  PLT 226 216   Cardiac Enzymes: No results for input(s): CKTOTAL, CKMB, CKMBINDEX, TROPONINI in the last 168 hours. BNP: Invalid input(s): POCBNP CBG: No results for input(s): GLUCAP in the last 168 hours. HbA1C: No results for input(s): HGBA1C in the last 72 hours. Urine analysis:    Component Value Date/Time   COLORURINE YELLOW 03/22/2019 1637   APPEARANCEUR CLOUDY (A) 03/22/2019  1637   APPEARANCEUR Cloudy (A) 05/15/2016 1629   LABSPEC 1.029 03/22/2019 1637   PHURINE 5.0 03/22/2019 1637   GLUCOSEU NEGATIVE 03/22/2019 1637   HGBUR NEGATIVE 03/22/2019 1637   BILIRUBINUR NEGATIVE 03/22/2019 1637   BILIRUBINUR Negative 05/15/2016 1629   KETONESUR 5 (A) 03/22/2019 1637   PROTEINUR 30 (A) 03/22/2019 1637   UROBILINOGEN 1.0 03/05/2014 1010   NITRITE POSITIVE (A) 03/22/2019 1637   LEUKOCYTESUR MODERATE (A) 03/22/2019 1637   Sepsis Labs: @LABRCNTIP (procalcitonin:4,lacticidven:4) ) Recent Results (from the past 240 hour(s))  SARS CORONAVIRUS 2 ([place] 6-24 HRS) Nasopharyngeal Nasopharyngeal Swab     Status: None   Collection Time: 03/22/19  4:36 PM   Specimen: Nasopharyngeal Swab  Result Value Ref Range Status   SARS Coronavirus 2 NEGATIVE NEGATIVE Final    Comment: (NOTE) SARS-CoV-2 target nucleic acids are NOT DETECTED. The SARS-CoV-2 RNA is generally detectable in upper and lower respiratory specimens during the acute phase of infection. Negative results do not preclude SARS-CoV-2 infection, do not rule out co-infections with other pathogens, and should not be used as the sole basis for treatment or other patient management decisions. Negative results must be combined with clinical observations, patient history, and epidemiological information. The expected result is Negative. Fact Sheet for Patients: 03/24/19 Fact Sheet for Healthcare Providers: HairSlick.no This test is not yet approved or cleared by the quierodirigir.com FDA and  has been authorized for detection and/or diagnosis of SARS-CoV-2 by FDA under an Emergency Use Authorization (EUA). This EUA will remain  in effect (meaning this test can be used) for the duration of the COVID-19 declaration under Section 56 4(b)(1) of the Act, 21 U.S.C. section 360bbb-3(b)(1), unless the authorization is terminated or revoked sooner. Performed at [facility], [address] [place], [place]      Scheduled Meds:  enoxaparin (LOVENOX) injection  40 mg Subcutaneous Q24H   sodium chloride flush  3 mL Intravenous Q12H   Continuous Infusions:  sodium chloride     [START ON 03/24/2019] azithromycin     cefTRIAXone (ROCEPHIN)  IV      Procedures/Studies: Ct Angio Chest Pe W And/or Wo Contrast  Result Date: 03/22/2019 CLINICAL DATA:  Fever. Shortness of breath. EXAM: CT ANGIOGRAPHY CHEST WITH CONTRAST TECHNIQUE: Multidetector CT imaging of the chest was performed using the standard protocol during bolus administration of intravenous contrast. Multiplanar CT image reconstructions and MIPs were obtained to evaluate the vascular anatomy. CONTRAST:  03/24/2019 OMNIPAQUE IOHEXOL 350 MG/ML SOLN COMPARISON:  01/06/2017 FINDINGS: Cardiovascular: Contrast injection is sufficient to demonstrate satisfactory opacification of the pulmonary arteries to the segmental level. There is no pulmonary embolus. The main pulmonary artery is within normal limits for size. There is no CT evidence of acute right heart strain. The visualized aorta is normal. Heart size is normal, without pericardial effusion. Mediastinum/Nodes: --No mediastinal or hilar lymphadenopathy. --No axillary lymphadenopathy. --No supraclavicular lymphadenopathy. --Normal thyroid gland. --The esophagus is unremarkable Lungs/Pleura: There is consolidation throughout the right lower lobe and to a lesser degree the right upper lobe. The left lung  field is clear. There is no significant pleural effusion. The trachea is unremarkable. Upper Abdomen: No acute abnormality. Musculoskeletal: No chest wall abnormality. No acute or significant osseous findings. Review of the MIP images confirms the above findings. IMPRESSION: 1. Multifocal pneumonia involving the right lower lobe and right upper lobe. 2. No evidence for pulmonary embolus. Electronically Signed   By: Constance Holster M.D.   On:  03/22/2019 23:03   Dg Chest Portable 1 View  Result Date: 03/22/2019 CLINICAL DATA:  Fever, cough, shortness of breath EXAM: PORTABLE CHEST 1 VIEW COMPARISON:  01/06/2017 FINDINGS: The heart size and mediastinal contours are within normal limits. Both lungs are clear. The visualized skeletal structures are unremarkable. IMPRESSION: No active disease. Electronically Signed   By: Davina Poke M.D.   On: 03/22/2019 17:30    Orson Eva, DO  [facility] Pager [phone]  If 7PM-7AM, please contact night-coverage [email or web address] Password [email or web address] 03/23/2019, 9:37 AM   LOS: 0 days

## 2019-03-23 NOTE — H&P (Signed)
History and Physical    Audrey [name] UKG:[id number] DOB: Apr 24, 1986 DOA: 03/22/2019  PCP: Laurey Morale, MD   Patient coming from: Home   Chief Complaint: Fever, aches, back pain, cough, SOB, N/V/D   HPI: Audrey [name] is a 33 y.o. female with medical history significant for recurrent UTIs, now presenting to the emergency department with fevers, aches, cough, shortness of breath, nausea, vomiting, loose stools, and flank pain.  Patient reports on multiple coworkers with whom she had close contact tested positive for COVID-19 recently and the patient herself developed fevers, malaise, and the other after mentioned symptoms approximately a week ago.  She reports testing negative for COVID-19 a few days ago but continued to feel poorly.  She has back pain that is reminiscent of when she had pyelonephritis, also reports exertional dyspnea with cough.  She was having loose stools, but that seems to be improving.  There was some abdominal discomfort, but no pain per se.  ED Course: Upon arrival to the ED, patient is found to be afebrile, saturating mid 90s on room air, tachycardic to 130 with slight exertion, and stable blood pressure.  Chemistry panel is notable for slight hypokalemia and CBC features a new lymphocytopenia and neutropenia.  CTA chest is negative for PE but concerning for multifocal pneumonia.  Urine is nitrite positive and there are moderate leukocytes noted.  Urine was sent for culture and the patient was given a liter of normal saline, Zofran, Advil, and Levaquin in the emergency department.  COVID-19 testing returned negative.  Review of Systems:  All other systems reviewed and apart from HPI, are negative.  Past Medical History:  Diagnosis Date  . Allergy   . Anxiety   . BV (bacterial vaginosis) 12/30/2012  . Chicken pox   . Chronic kidney disease   . Contraceptive management 07/15/2013  . Hay fever   . History of kidney stones   . Irregular bleeding 12/30/2012  .  Leukorrhea 10/07/2013   resolved s/p cryotherapy    . Migraines   . PONV (postoperative nausea and vomiting)   . Postcoital bleeding 05/13/2013   Had [place] and IUD in place, bleeding stopped after doxycycline but started back, has normal period lite x 2 days but bleeds every time with sex, is bright red and has clots and cramps after sex   . UTI (lower urinary tract infection) 08/24/2014  . UTI (urinary tract infection)     Past Surgical History:  Procedure Laterality Date  . CHOLECYSTECTOMY N/A 03/09/2014   Procedure: LAPAROSCOPIC CHOLECYSTECTOMY;  Surgeon: Jamesetta So, MD;  Location: [facility];  Service: General;  Laterality: N/A;  . DILATION AND CURETTAGE OF UTERUS    . TONSILLECTOMY       reports that she has never smoked. She has never used smokeless tobacco. She reports that she does not drink alcohol or use drugs.  Allergies  Allergen Reactions  . Bee Venom Shortness Of Breath  . Codeine Itching  . Penicillins Hives    Has patient had a PCN reaction causing immediate rash, facial/tongue/throat swelling, SOB or lightheadedness with hypotension: no Has patient had a PCN reaction causing severe rash involving mucus membranes or skin necrosis: no Has patient had a PCN reaction that required hospitalization no Has patient had a PCN reaction occurring within the last 10 years: no If all of the above answers are "NO", then may proceed with Cephalosporin use.   . Tape Hives  . Bactrim [Sulfamethoxazole-Trimethoprim] Itching and Rash  Family History  Problem Relation Age of Onset  . Arthritis Mother   . Lung cancer Mother   . Hyperlipidemia Mother   . Atrial fibrillation Mother   . Stroke Mother   . Hypertension Mother   . Diabetes Father   . Dementia Father   . Breast cancer Maternal Aunt   . Diabetes Paternal Uncle      Prior to Admission medications   Medication Sig Start Date End Date Taking? Authorizing Provider  ibuprofen (ADVIL) 200 MG tablet Take 200 mg by mouth  every 6 (six) hours as needed for mild pain or moderate pain.   Yes [provider]    Physical Exam: Vitals:   03/22/19 2155 03/22/19 2158 03/22/19 2200 03/23/19 0100  BP: (!) 149/90  132/90 (!) 93/56  Pulse: (!) 123 (!) 104 93 94  Resp:      Temp:      TempSrc:      SpO2: 99% 99% 99% 94%  Weight:      Height:        Constitutional: NAD, calm  Eyes: PERTLA, lids and conjunctivae normal ENMT: Mucous membranes are moist. Posterior pharynx clear of any exudate or lesions.   Neck: normal, supple, no masses, no thyromegaly Respiratory:  no wheezing, no crackles. Dyspneic with speech. No accessory muscle use.  Cardiovascular: S1 & S2 heard, regular rate and rhythm. No extremity edema.   Abdomen: No distension, no tenderness, soft. Bowel sounds normal.  Musculoskeletal: no clubbing / cyanosis. No joint deformity upper and lower extremities.   Skin: no significant rashes, lesions, ulcers. Warm, dry, well-perfused. Neurologic: no facial asymmetry. Sensation intact. Moving all extremities.  Psychiatric: Alert and oriented to person, place, and situation. Calm, cooperative.    Labs on Admission: I have personally reviewed following labs and imaging studies  CBC: Recent Labs  Lab 03/22/19 1705  WBC 1.9*  NEUTROABS 1.2*  HGB 13.7  HCT 42.5  MCV 86.9  PLT 226   Basic Metabolic Panel: Recent Labs  Lab 03/22/19 1705  NA 136  K 3.4*  CL 103  CO2 22  GLUCOSE 95  BUN 14  CREATININE 0.77  CALCIUM 8.2*   GFR: Estimated Creatinine Clearance: 122.8 mL/min (by C-G formula based on SCr of 0.77 mg/dL). Liver Function Tests: No results for input(s): AST, ALT, ALKPHOS, BILITOT, PROT, ALBUMIN in the last 168 hours. No results for input(s): LIPASE, AMYLASE in the last 168 hours. No results for input(s): AMMONIA in the last 168 hours. Coagulation Profile: No results for input(s): INR, PROTIME in the last 168 hours. Cardiac Enzymes: No results for input(s): CKTOTAL, CKMB,  CKMBINDEX, TROPONINI in the last 168 hours. BNP (last 3 results) No results for input(s): PROBNP in the last 8760 hours. HbA1C: No results for input(s): HGBA1C in the last 72 hours. CBG: No results for input(s): GLUCAP in the last 168 hours. Lipid Profile: No results for input(s): CHOL, HDL, LDLCALC, TRIG, CHOLHDL, LDLDIRECT in the last 72 hours. Thyroid Function Tests: No results for input(s): TSH, T4TOTAL, FREET4, T3FREE, THYROIDAB in the last 72 hours. Anemia Panel: No results for input(s): VITAMINB12, FOLATE, FERRITIN, TIBC, IRON, RETICCTPCT in the last 72 hours. Urine analysis:    Component Value Date/Time   COLORURINE YELLOW 03/22/2019 1637   APPEARANCEUR CLOUDY (A) 03/22/2019 1637   APPEARANCEUR Cloudy (A) 05/15/2016 1629   LABSPEC 1.029 03/22/2019 1637   PHURINE 5.0 03/22/2019 1637   GLUCOSEU NEGATIVE 03/22/2019 1637   HGBUR NEGATIVE 03/22/2019 1637   BILIRUBINUR  NEGATIVE 03/22/2019 1637   BILIRUBINUR Negative 05/15/2016 1629   KETONESUR 5 (A) 03/22/2019 1637   PROTEINUR 30 (A) 03/22/2019 1637   UROBILINOGEN 1.0 03/05/2014 1010   NITRITE POSITIVE (A) 03/22/2019 1637   LEUKOCYTESUR MODERATE (A) 03/22/2019 1637   Sepsis Labs: @LABRCNTIP (procalcitonin:4,lacticidven:4) ) Recent Results (from the past 240 hour(s))  SARS CORONAVIRUS 2 ([place] 6-24 HRS) Nasopharyngeal Nasopharyngeal Swab     Status: None   Collection Time: 03/22/19  4:36 PM   Specimen: Nasopharyngeal Swab  Result Value Ref Range Status   SARS Coronavirus 2 NEGATIVE NEGATIVE Final    Comment: (NOTE) SARS-CoV-2 target nucleic acids are NOT DETECTED. The SARS-CoV-2 RNA is generally detectable in upper and lower respiratory specimens during the acute phase of infection. Negative results do not preclude SARS-CoV-2 infection, do not rule out co-infections with other pathogens, and should not be used as the sole basis for treatment or other patient management decisions. Negative results must be combined with  clinical observations, patient history, and epidemiological information. The expected result is Negative. Fact Sheet for Patients: HairSlick.nohttps://www.fda.gov/media/138098/download Fact Sheet for Healthcare Providers: quierodirigir.comhttps://www.fda.gov/media/138095/download This test is not yet approved or cleared by the [place] FDA and  has been authorized for detection and/or diagnosis of SARS-CoV-2 by FDA under an Emergency Use Authorization (EUA). This EUA will remain  in effect (meaning this test can be used) for the duration of the COVID-19 declaration under Section 56 4(b)(1) of the Act, 21 U.S.C. section 360bbb-3(b)(1), unless the authorization is terminated or revoked sooner. Performed at [facility], [address] [place], [place] [address]      Radiological Exams on Admission: Ct Angio Chest Pe W And/or Wo Contrast  Result Date: 03/22/2019 CLINICAL DATA:  Fever. Shortness of breath. EXAM: CT ANGIOGRAPHY CHEST WITH CONTRAST TECHNIQUE: Multidetector CT imaging of the chest was performed using the standard protocol during bolus administration of intravenous contrast. Multiplanar CT image reconstructions and MIPs were obtained to evaluate the vascular anatomy. CONTRAST:  100mL OMNIPAQUE IOHEXOL 350 MG/ML SOLN COMPARISON:  01/06/2017 FINDINGS: Cardiovascular: Contrast injection is sufficient to demonstrate satisfactory opacification of the pulmonary arteries to the segmental level. There is no pulmonary embolus. The main pulmonary artery is within normal limits for size. There is no CT evidence of acute right heart strain. The visualized aorta is normal. Heart size is normal, without pericardial effusion. Mediastinum/Nodes: --No mediastinal or hilar lymphadenopathy. --No axillary lymphadenopathy. --No supraclavicular lymphadenopathy. --Normal thyroid gland. --The esophagus is unremarkable Lungs/Pleura: There is consolidation throughout the right lower lobe and to a lesser degree the right upper  lobe. The left lung field is clear. There is no significant pleural effusion. The trachea is unremarkable. Upper Abdomen: No acute abnormality. Musculoskeletal: No chest wall abnormality. No acute or significant osseous findings. Review of the MIP images confirms the above findings. IMPRESSION: 1. Multifocal pneumonia involving the right lower lobe and right upper lobe. 2. No evidence for pulmonary embolus. Electronically Signed   By: Katherine Mantlehristopher  Green M.D.   On: 03/22/2019 23:03   Dg Chest Portable 1 View  Result Date: 03/22/2019 CLINICAL DATA:  Fever, cough, shortness of breath EXAM: PORTABLE CHEST 1 VIEW COMPARISON:  01/06/2017 FINDINGS: The heart size and mediastinal contours are within normal limits. Both lungs are clear. The visualized skeletal structures are unremarkable. IMPRESSION: No active disease. Electronically Signed   By: Duanne GuessNicholas  Plundo M.D.   On: 03/22/2019 17:30    EKG: Not performed.   Assessment/Plan   1. Pneumonia  - Presents  with several days of malaise, aches, DOE, and back pain, reported some COVID-19 contacts but reports testing negative a few days ago and is negative again today, but was noted to CTA chest findings worrisome for multifocal PNA  - She was started on Levaquin in ED, was considered for discharge home from ED, but became tachypneic, dyspneic, and tachycardic with taking only a few steps  - Check sputum culture, check strep pneumo and legionella antigens, continue Levaquin, trend procalcitonin, follow cultures and clinical course    2. Pyelonephritis  - Patient reports back pain similar to when she had pyelonephritis, UA is compatible with infection, and sample was sent for culture  - Continue Levaquin, follow cultures and clinical course    3. Leukopenia  - ALC is 550 and ANC 1159 in ED, previously normal   - Likely secondary to infection  - Continue antibiotics as above, check HIV and viral hepatitis panel     PPE: CAPR, gown, gloves  DVT  prophylaxis: Lovenox  Code Status: Full  Family Communication: Discussed with patient  Consults called: None  Admission status: Observation     Briscoe Deutscher, MD [facility] Pager [phone]  If 7PM-7AM, please contact night-coverage [email or web address] Password [email or web address]  03/23/2019, 1:30 AM

## 2019-03-23 NOTE — Progress Notes (Signed)
Pharmacy Antibiotic Note  Audrey [name] is a 33 y.o. female admitted on 03/22/2019 with CAP.  Pharmacy has been consulted for Levaquin dosing. Noted pt received Levaquin 750mg  IV 10/27 ~0120  Pharmacist to investigate beta-lactam allergy. If history of intolerance, mild allergy, or documented history of use of cephalosporins, pharmacy can adjust levofloxacin to ceftriaxone and azithromycin.  Pt has tolerated cephalosporins in the past.  Plan: Will adjust to Rocephin 1gm IV Q24h and azithromycin 500mg  IV daily - start 24 hr post Levaquin dose.  Pharmacy will sign off - please reconsult if needed  Height: 5\' 5"  (165.1 cm) Weight: 240 lb (108.9 kg) IBW/kg (Calculated) : 57  Temp (24hrs), Avg:98.4 F (36.9 C), Min:98.4 F (36.9 C), Max:98.4 F (36.9 C)  Recent Labs  Lab 03/22/19 1705  WBC 1.9*  CREATININE 0.77    Estimated Creatinine Clearance: 122.8 mL/min (by C-G formula based on SCr of 0.77 mg/dL).    Allergies  Allergen Reactions  . Bee Venom Shortness Of Breath  . Codeine Itching  . Penicillins Hives    Has patient had a PCN reaction causing immediate rash, facial/tongue/throat swelling, SOB or lightheadedness with hypotension: no Has patient had a PCN reaction causing severe rash involving mucus membranes or skin necrosis: no Has patient had a PCN reaction that required hospitalization no Has patient had a PCN reaction occurring within the last 10 years: no If all of the above answers are "NO", then may proceed with Cephalosporin use.   . Tape Hives  . Bactrim [Sulfamethoxazole-Trimethoprim] Itching and Rash     Thank you for allowing pharmacy to be a part of this patient's care.  Sherlon Handing, PharmD, BCPS CGV Clinical pharmacist phone [phone] 03/23/2019 1:28 AM

## 2019-03-23 NOTE — ED Notes (Signed)
Pt given crackers & ginger ale

## 2019-03-24 DIAGNOSIS — R509 Fever, unspecified: Secondary | ICD-10-CM

## 2019-03-24 LAB — LEGIONELLA PNEUMOPHILA SEROGP 1 UR AG: L. pneumophila Serogp 1 Ur Ag: NEGATIVE

## 2019-03-24 LAB — PROCALCITONIN: Procalcitonin: <0.1 ng/mL

## 2019-03-24 LAB — CBC WITH DIFFERENTIAL/PLATELET
Abs Immature Granulocytes: 0.01 10*3/uL (ref 0.00–0.07)
Basophils Absolute: 0 10*3/uL (ref 0.0–0.1)
Basophils Relative: 0 %
Eosinophils Absolute: 0 10*3/uL (ref 0.0–0.5)
Eosinophils Relative: 0 %
HCT: 37.2 % (ref 36.0–46.0)
Hemoglobin: 11.7 g/dL — ABNORMAL LOW (ref 12.0–15.0)
Immature Granulocytes: 0 %
Lymphocytes Relative: 21 %
Lymphs Abs: 0.5 10*3/uL — ABNORMAL LOW (ref 0.7–4.0)
MCH: 27.8 pg (ref 26.0–34.0)
MCHC: 31.5 g/dL (ref 30.0–36.0)
MCV: 88.4 fL (ref 80.0–100.0)
Monocytes Absolute: 0.1 10*3/uL (ref 0.1–1.0)
Monocytes Relative: 6 %
Neutro Abs: 1.9 10*3/uL (ref 1.7–7.7)
Neutrophils Relative %: 73 %
Platelets: 223 10*3/uL (ref 150–400)
RBC: 4.21 MIL/uL (ref 3.87–5.11)
RDW: 13.2 % (ref 11.5–15.5)
WBC: 2.6 10*3/uL — ABNORMAL LOW (ref 4.0–10.5)
nRBC: 0 % (ref 0.0–0.2)

## 2019-03-24 LAB — EHRLICHIA ANTIBODY PANEL
E chaffeensis (HGE) Ab, IgG: NEGATIVE
E chaffeensis (HGE) Ab, IgM: NEGATIVE
E. Chaffeensis (HME) IgM Titer: NEGATIVE
E.Chaffeensis (HME) IgG: NEGATIVE

## 2019-03-24 MED ORDER — DEXTROMETHORPHAN POLISTIREX ER 30 MG/5ML PO SUER: 60.0000 mg | Freq: Two times a day (BID) | ORAL | Status: DC | PRN

## 2019-03-24 MED ORDER — ACETAMINOPHEN 325 MG PO TABS
650.0000 mg | ORAL_TABLET | Freq: Four times a day (QID) | ORAL | Status: DC
  Administered 2019-03-24 – 2019-03-25 (×5): 650 mg via ORAL
  Filled 2019-03-24 (×6): qty 2

## 2019-03-24 MED ORDER — IBUPROFEN 800 MG PO TABS: 800.0000 mg | ORAL_TABLET | Freq: Three times a day (TID) | ORAL | Status: DC | PRN

## 2019-03-24 MED ORDER — FLUCONAZOLE 150 MG PO TABS
150.0000 mg | ORAL_TABLET | Freq: Every day | ORAL | Status: DC
  Administered 2019-03-24 – 2019-03-25 (×2): 150 mg via ORAL
  Filled 2019-03-24 (×3): qty 1

## 2019-03-24 NOTE — Plan of Care (Signed)

## 2019-03-24 NOTE — Progress Notes (Signed)
PROGRESS NOTE  Audrey [name] ZOX:[id number] DOB: March 30, 1986 DOA: 03/22/2019 PCP: Nelwyn Salisbury, MD  Brief History:  33 year old female with no documented chronic medical problems presenting with 1 week history of fever, cough, malaise, and shortness of breath.  The patient had some nausea without any vomiting.  She also complained of some right flank pain but denied any frank hematuria, dysuria.  She stated that her fevers were up to 101.7 F.  She denied headache, neck pain, hemoptysis, diarrhea, hematochezia, melena.  Patient stated that she has had multiple coworkers with positive Covid tests.  In the emergency department, the patient was afebrile hemodynamically stable albeit with soft blood pressures.  Oxygen saturation was 94% on room air.  The patient was ambulated and became tachycardic and short of breath.  As result, admission was advised.  CT angiogram chest was negative for pulmonary embolus, but showed consolidation right lower lobe and right upper lobe.  Assessment/Plan: Multifocal lobar pneumonia -Continue ceftriaxone and azithromycin -Urine Legionella antigen -Urine Streptococcus pneumonia antigen -Check influenza PCR ---NEG -SARS-CoV2--neg -adding delsym for cough symptoms  UTI / Pyuria/flank pain -Continue ceftriaxone pending culture data  Leukopenia -Suspect this may be due to acute infectious process, possibly viral -Lower respiratory panel -Influenza PCR -trending up -check RMSF -check Ehrlichia serology  Hypokalemia -replete -check mag -IVF with KCl  Disposition Plan:   Home  10/29 Family Communication:   No Family at bedside  Consultants:  none  Code Status:  FULL  DVT Prophylaxis:  [place] Lovenox   Procedures: As Listed in Progress Note Above  Antibiotics: Ceftriaxone 10/26>>> azithro 10/26>>>  Subjective: Patient had high fever overnight.  Still coughing a lot.   Objective: Vitals:   03/23/19 2018 03/23/19 2129 03/24/19 0150  03/24/19 0454  BP:  110/86  108/69  Pulse:  (!) 108  (!) 107  Resp:  18  16  Temp:  98.2 F (36.8 C) (!) 103 F (39.4 C) 98.1 F (36.7 C)  TempSrc:   Oral   SpO2: 99% 98%  94%  Weight:      Height:        Intake/Output Summary (Last 24 hours) at 03/24/2019 1311 Last data filed at 03/24/2019 0500 Gross per 24 hour  Intake 480 ml  Output -  Net 480 ml   Weight change: 0.037 kg Exam:   General:  Pt is alert, follows commands appropriately, not in acute distress  HEENT: No icterus, No thrush, No neck mass, [place]/AT  Cardiovascular: RRR, S1/S2, no rubs, no gallops  Respiratory:bilateral rales R>L, no wheeze  Abdomen: Soft/+BS, non tender, non distended, no guarding  Extremities: No edema, No lymphangitis, No petechiae, No rashes, no synovitis  Data Reviewed: I have personally reviewed following labs and imaging studies Basic Metabolic Panel: Recent Labs  Lab 03/22/19 1705 03/23/19 0250  NA 136 137  K 3.4* 3.2*  CL 103 106  CO2 22 23  GLUCOSE 95 107*  BUN 14 11  CREATININE 0.77 0.82  CALCIUM 8.2* 7.6*  MG  --  1.8   Liver Function Tests: Recent Labs  Lab 03/23/19 1033  AST 38  ALT 63*  ALKPHOS 63  BILITOT 0.5  PROT 6.5  ALBUMIN 3.7   No results for input(s): LIPASE, AMYLASE in the last 168 hours. No results for input(s): AMMONIA in the last 168 hours. Coagulation Profile: No results for input(s): INR, PROTIME in the last 168 hours. CBC: Recent Labs  Lab  03/22/19 1705 03/23/19 0250 03/24/19 0619  WBC 1.9* 2.0* 2.6*  NEUTROABS 1.2* 1.3* 1.9  HGB 13.7 12.4 11.7*  HCT 42.5 39.1 37.2  MCV 86.9 88.3 88.4  PLT 226 216 223   Cardiac Enzymes: No results for input(s): CKTOTAL, CKMB, CKMBINDEX, TROPONINI in the last 168 hours. BNP: Invalid input(s): POCBNP CBG: No results for input(s): GLUCAP in the last 168 hours. HbA1C: No results for input(s): HGBA1C in the last 72 hours. Urine analysis:    Component Value Date/Time   COLORURINE YELLOW  03/22/2019 1637   APPEARANCEUR CLOUDY (A) 03/22/2019 1637   APPEARANCEUR Cloudy (A) 05/15/2016 1629   LABSPEC 1.029 03/22/2019 1637   PHURINE 5.0 03/22/2019 1637   GLUCOSEU NEGATIVE 03/22/2019 1637   HGBUR NEGATIVE 03/22/2019 1637   BILIRUBINUR NEGATIVE 03/22/2019 1637   BILIRUBINUR Negative 05/15/2016 1629   KETONESUR 5 (A) 03/22/2019 1637   PROTEINUR 30 (A) 03/22/2019 1637   UROBILINOGEN 1.0 03/05/2014 1010   NITRITE POSITIVE (A) 03/22/2019 1637   LEUKOCYTESUR MODERATE (A) 03/22/2019 1637   Recent Results (from the past 240 hour(s))  Urine Culture     Status: Abnormal (Preliminary result)   Collection Time: 03/22/19  4:23 PM   Specimen: Urine, Random  Result Value Ref Range Status   Specimen Description   Final    URINE, RANDOM Performed at [facility], [address] [place], [place] [address]    Special Requests   Final    NONE Performed at [facility], [address] [place], [place] [address]    Culture >=100,000 COLONIES/mL ESCHERICHIA COLI (A)  Final   Report Status PENDING  Incomplete  SARS CORONAVIRUS 2 (TAT 6-24 HRS) Nasopharyngeal Nasopharyngeal Swab     Status: None   Collection Time: 03/22/19  4:36 PM   Specimen: Nasopharyngeal Swab  Result Value Ref Range Status   SARS Coronavirus 2 NEGATIVE NEGATIVE Final    Comment: (NOTE) SARS-CoV-2 target nucleic acids are NOT DETECTED. The SARS-CoV-2 RNA is generally detectable in upper and lower respiratory specimens during the acute phase of infection. Negative results do not preclude SARS-CoV-2 infection, do not rule out co-infections with other pathogens, and should not be used as the sole basis for treatment or other patient management decisions. Negative results must be combined with clinical observations, patient history, and epidemiological information. The expected result is Negative. Fact Sheet for Patients: HairSlick.nohttps://www.fda.gov/media/138098/download Fact Sheet for Healthcare Providers:  quierodirigir.comhttps://www.fda.gov/media/138095/download This test is not yet approved or cleared by the [place] FDA and  has been authorized for detection and/or diagnosis of SARS-CoV-2 by FDA under an Emergency Use Authorization (EUA). This EUA will remain  in effect (meaning this test can be used) for the duration of the COVID-19 declaration under Section 56 4(b)(1) of the Act, 21 U.S.C. section 360bbb-3(b)(1), unless the authorization is terminated or revoked sooner. Performed at [facility], [address] [place], [place] [address]   Respiratory Panel by PCR     Status: None   Collection Time: 03/23/19 10:14 AM   Specimen: Nasopharyngeal Swab; Respiratory  Result Value Ref Range Status   Adenovirus NOT DETECTED NOT DETECTED Final   Coronavirus 229E NOT DETECTED NOT DETECTED Final    Comment: (NOTE) The Coronavirus on the Respiratory Panel, DOES NOT test for the novel  Coronavirus (2019 nCoV)    Coronavirus HKU1 NOT DETECTED NOT DETECTED Final   Coronavirus NL63 NOT DETECTED NOT DETECTED Final   Coronavirus OC43 NOT DETECTED NOT DETECTED Final   Metapneumovirus NOT  DETECTED NOT DETECTED Final   Rhinovirus / Enterovirus NOT DETECTED NOT DETECTED Final   Influenza A NOT DETECTED NOT DETECTED Final   Influenza B NOT DETECTED NOT DETECTED Final   Parainfluenza Virus 1 NOT DETECTED NOT DETECTED Final   Parainfluenza Virus 2 NOT DETECTED NOT DETECTED Final   Parainfluenza Virus 3 NOT DETECTED NOT DETECTED Final   Parainfluenza Virus 4 NOT DETECTED NOT DETECTED Final   Respiratory Syncytial Virus NOT DETECTED NOT DETECTED Final   Bordetella pertussis NOT DETECTED NOT DETECTED Final   Chlamydophila pneumoniae NOT DETECTED NOT DETECTED Final   Mycoplasma pneumoniae NOT DETECTED NOT DETECTED Final    Comment: Performed at [facility], [place] [address] , [place] [address]     Scheduled Meds: . acetaminophen  650 mg Oral Q6H  . enoxaparin (LOVENOX) injection  50 mg  Subcutaneous Q24H  . fluconazole  150 mg Oral Daily  . sodium chloride flush  3 mL Intravenous Q12H   Continuous Infusions: . sodium chloride    . azithromycin 500 mg (03/23/19 2355)  . cefTRIAXone (ROCEPHIN)  IV 1 g (03/23/19 2250)    Procedures/Studies: Ct Angio Chest Pe W And/or Wo Contrast  Result Date: 03/22/2019 CLINICAL DATA:  Fever. Shortness of breath. EXAM: CT ANGIOGRAPHY CHEST WITH CONTRAST TECHNIQUE: Multidetector CT imaging of the chest was performed using the standard protocol during bolus administration of intravenous contrast. Multiplanar CT image reconstructions and MIPs were obtained to evaluate the vascular anatomy. CONTRAST:  136mL OMNIPAQUE IOHEXOL 350 MG/ML SOLN COMPARISON:  01/06/2017 FINDINGS: Cardiovascular: Contrast injection is sufficient to demonstrate satisfactory opacification of the pulmonary arteries to the segmental level. There is no pulmonary embolus. The main pulmonary artery is within normal limits for size. There is no CT evidence of acute right heart strain. The visualized aorta is normal. Heart size is normal, without pericardial effusion. Mediastinum/Nodes: --No mediastinal or hilar lymphadenopathy. --No axillary lymphadenopathy. --No supraclavicular lymphadenopathy. --Normal thyroid gland. --The esophagus is unremarkable Lungs/Pleura: There is consolidation throughout the right lower lobe and to a lesser degree the right upper lobe. The left lung field is clear. There is no significant pleural effusion. The trachea is unremarkable. Upper Abdomen: No acute abnormality. Musculoskeletal: No chest wall abnormality. No acute or significant osseous findings. Review of the MIP images confirms the above findings. IMPRESSION: 1. Multifocal pneumonia involving the right lower lobe and right upper lobe. 2. No evidence for pulmonary embolus. Electronically Signed   By: Constance Holster M.D.   On: 03/22/2019 23:03   Dg Chest Portable 1 View  Result Date: 03/22/2019  CLINICAL DATA:  Fever, cough, shortness of breath EXAM: PORTABLE CHEST 1 VIEW COMPARISON:  01/06/2017 FINDINGS: The heart size and mediastinal contours are within normal limits. Both lungs are clear. The visualized skeletal structures are unremarkable. IMPRESSION: No active disease. Electronically Signed   By: Davina Poke M.D.   On: 03/22/2019 17:30    Irwin Brakeman, MD   [facility] How to contact the [facility] Attending or Consulting provider [place] or covering provider during after hours [place], for this patient?  1. Check the care team in [facility] and look for a) attending/consulting [facility] provider listed and b) the [facility] team listed 2. Log into [email or web address] and use [place]'s universal password to access. If you do not have the password, please contact the hospital operator. 3. Locate the [facility] provider you are looking for under [facility] and page to a number that you can be directly  reached. 4. If you still have difficulty reaching the provider, please page the [facility] (Director on Call) for the Hospitalists listed on amion for assistance.   If 7PM-7AM, please contact night-coverage [email or web address] Password [email or web address] 03/24/2019, 1:11 PM   LOS: 1 day

## 2019-03-25 DIAGNOSIS — J181 Lobar pneumonia, unspecified organism: Principal | ICD-10-CM

## 2019-03-25 DIAGNOSIS — N3000 Acute cystitis without hematuria: Secondary | ICD-10-CM

## 2019-03-25 LAB — MAGNESIUM: Magnesium: 1.8 mg/dL (ref 1.7–2.4)

## 2019-03-25 LAB — CBC WITH DIFFERENTIAL/PLATELET
Abs Immature Granulocytes: 0.01 10*3/uL (ref 0.00–0.07)
Basophils Absolute: 0 10*3/uL (ref 0.0–0.1)
Basophils Relative: 0 %
Eosinophils Absolute: 0 10*3/uL (ref 0.0–0.5)
Eosinophils Relative: 0 %
HCT: 38.5 % (ref 36.0–46.0)
Hemoglobin: 12.2 g/dL (ref 12.0–15.0)
Immature Granulocytes: 0 %
Lymphocytes Relative: 19 %
Lymphs Abs: 0.5 10*3/uL — ABNORMAL LOW (ref 0.7–4.0)
MCH: 27.9 pg (ref 26.0–34.0)
MCHC: 31.7 g/dL (ref 30.0–36.0)
MCV: 88.1 fL (ref 80.0–100.0)
Monocytes Absolute: 0.1 10*3/uL (ref 0.1–1.0)
Monocytes Relative: 5 %
Neutro Abs: 2 10*3/uL (ref 1.7–7.7)
Neutrophils Relative %: 76 %
Platelets: 220 10*3/uL (ref 150–400)
RBC: 4.37 MIL/uL (ref 3.87–5.11)
RDW: 13.1 % (ref 11.5–15.5)
WBC: 2.6 10*3/uL — ABNORMAL LOW (ref 4.0–10.5)
nRBC: 0 % (ref 0.0–0.2)

## 2019-03-25 LAB — BASIC METABOLIC PANEL
Anion gap: 11 (ref 5–15)
BUN: 8 mg/dL (ref 6–20)
CO2: 24 mmol/L (ref 22–32)
Calcium: 8 mg/dL — ABNORMAL LOW (ref 8.9–10.3)
Chloride: 104 mmol/L (ref 98–111)
Creatinine, Ser: 0.83 mg/dL (ref 0.44–1.00)
GFR calc Af Amer: >60 mL/min (ref >60)
GFR calc non Af Amer: >60 mL/min (ref >60)
Glucose, Bld: 112 mg/dL — ABNORMAL HIGH (ref 70–99)
Potassium: 3.1 mmol/L — ABNORMAL LOW (ref 3.5–5.1)
Sodium: 139 mmol/L (ref 135–145)

## 2019-03-25 LAB — URINE CULTURE: Culture: >=100000 — AB

## 2019-03-25 LAB — ROCKY MTN SPOTTED FVR ABS PNL(IGG+IGM)
RMSF IgG: NEGATIVE
RMSF IgM: 0.39 index (ref 0.00–0.89)

## 2019-03-25 MED ORDER — ONDANSETRON HCL 4 MG PO TABS: 4.0000 mg | ORAL_TABLET | Freq: Three times a day (TID) | ORAL | 0 refills | Status: DC | PRN

## 2019-03-25 MED ORDER — ACETAMINOPHEN 325 MG PO TABS: 650.0000 mg | ORAL_TABLET | Freq: Four times a day (QID) | ORAL | Status: DC | PRN

## 2019-03-25 MED ORDER — POTASSIUM CHLORIDE CRYS ER 20 MEQ PO TBCR
40.0000 meq | EXTENDED_RELEASE_TABLET | Freq: Once | ORAL | Status: AC
  Administered 2019-03-25: 40 meq via ORAL
  Filled 2019-03-25: qty 2

## 2019-03-25 MED ORDER — POTASSIUM CHLORIDE ER 10 MEQ PO TBCR: 10.0000 meq | EXTENDED_RELEASE_TABLET | Freq: Every day | ORAL | 0 refills | Status: DC

## 2019-03-25 MED ORDER — DEXTROMETHORPHAN POLISTIREX ER 30 MG/5ML PO SUER: 60.0000 mg | Freq: Two times a day (BID) | ORAL | 0 refills | Status: DC | PRN

## 2019-03-25 MED ORDER — CEFDINIR 300 MG PO CAPS: 600.0000 mg | ORAL_CAPSULE | Freq: Every day | ORAL | 0 refills | Status: AC

## 2019-03-25 MED ORDER — LEVOFLOXACIN 750 MG PO TABS: 750.0000 mg | ORAL_TABLET | Freq: Every day | ORAL | 0 refills | Status: AC

## 2019-03-25 MED ORDER — FLUCONAZOLE 150 MG PO TABS: 150.0000 mg | ORAL_TABLET | Freq: Every day | ORAL | 0 refills | Status: DC | PRN

## 2019-03-25 NOTE — Discharge Instructions (Signed)
Community-Acquired Pneumonia, Adult °Pneumonia is a type of lung infection that causes swelling in the airways of the lungs. Mucus and fluid may also build up inside the airways. This may cause coughing and difficulty breathing. °There are different types of pneumonia. One type can develop while a person is in a hospital. A different type is called community-acquired pneumonia. It develops in people who are not, and have not recently been, in the hospital or another type of health care facility. °What are the causes? °This condition may be caused by: °· Viruses. This is the most common cause of pneumonia. °· Bacteria. Community-acquired pneumonia is often caused by Streptococcus pneumoniae bacteria. These bacteria are often passed from one person to another by breathing in droplets from the cough or sneeze of an infected person. °· Fungi. This is the least common cause of pneumonia. °What increases the risk? °The following factors may make you more likely to develop this condition: °· Having a chronic disease, such as chronic obstructive pulmonary disease (COPD), asthma, congestive heart failure, cystic fibrosis, diabetes, or kidney disease. °· Having early-stage or late-stage HIV. °· Having sickle cell disease. °· Having had your spleen removed (splenectomy). °· Having poor dental hygiene. °· Having a medical condition that increases the risk of breathing in (aspirating) secretions from your own mouth and nose. °· Having a weakened body defense system (immune system). °· Being a smoker. °· Traveling to areas where pneumonia-causing germs commonly exist. °· Being around animal habitats or animals that have pneumonia-causing germs, including birds, bats, rabbits, cats, and farm animals. °What are the signs or symptoms? °Symptoms of this condition include: °· A dry cough. °· A wet (productive) cough. °· Fever. °· Sweating. °· Chest pain, especially when breathing deeply or coughing. °· Rapid breathing or difficulty  breathing. °· Shortness of breath. °· Shaking chills. °· Fatigue. °· Muscle aches. °How is this diagnosed? °This condition may be diagnosed based on: °· Your medical history. °· A physical exam. °You may also have tests, including: °· Chest X-rays. °· Tests of your blood oxygen level and other blood gases. °· Tests on blood, mucus (sputum), fluid around your lungs (pleural fluid), and urine. °If your pneumonia is severe, other tests may be done to find the exact cause of your illness. °How is this treated? °Treatment for this condition depends on many factors, such as the cause of your pneumonia, the medicines you take, and other medical conditions that you have. °For most adults, treatment and recovery from pneumonia may occur at home. In some cases, treatment must happen in a hospital. Treatment may include: °· Medicines that are given by mouth or through an IV, including: °? Antibiotic medicines, if the pneumonia was caused by bacteria. °? Antiviral medicines, if the pneumonia was caused by a virus. °· Being given extra oxygen. °· Respiratory therapy. °Although rare, treating severe pneumonia may include: °· Using a machine to help you breathe (mechanical ventilation). This is done if you are not breathing well on your own and you cannot maintain a safe blood oxygen level. °· Thoracentesis. This is a procedure to remove fluid from around one lung or both lungs to help you breathe better. °Follow these instructions at home: ° °Medicines °· Take over-the-counter and prescription medicines only as told by your health care provider. °? Only take cough medicine if you are losing sleep. Be aware that cough medicine can prevent your body's natural ability to remove mucus from your lungs. °· If you were prescribed an antibiotic   medicine, take it as told by your health care provider. Do not stop taking the antibiotic even if you start to feel better. General instructions  Sleep in a semi-upright position at night. Try  sleeping in a reclining chair, or place a few pillows under your head.  Rest as needed and get at least 8 hours of sleep each night.  Drink enough water to keep your urine pale yellow. This will help to thin out mucus secretions in your lungs.  Eat a healthy diet that includes plenty of vegetables, fruits, whole grains, low-fat dairy products, and lean protein.  Do not use any products that contain nicotine or tobacco, such as cigarettes, e-cigarettes, and chewing tobacco. If you need help quitting, ask your health care provider.  Keep all follow-up visits as told by your health care provider. This is important. How is this prevented? You can lower your risk of developing community-acquired pneumonia by:  Getting a pneumococcal vaccine. There are different types and schedules of pneumococcal vaccines. Ask your health care provider which option is best for you. Consider getting the vaccine if: ? You are older than 33 years of age. ? You are older than 33 years of age and are undergoing cancer treatment, have chronic lung disease, or have other medical conditions that affect your immune system. Ask your health care provider if this applies to you.  Getting an influenza vaccine every year. Ask your health care provider which type of vaccine is best for you.  Getting regular checkups from your dentist.  Washing your hands often. If soap and water are not available, use hand sanitizer. Contact a health care provider if:  You have a fever.  You are losing sleep because you cannot control your cough with cough medicine. Get help right away if:  You have worsening shortness of breath.  You have increased chest pain.  Your sickness becomes worse, especially if you are an older adult or have a weakened immune system.  You cough up blood. Summary  Pneumonia is an infection of the lungs.  Community-acquired pneumonia develops in people who have not been in the hospital. It can be caused  by bacteria, viruses, or fungi.  This condition may be treated with antibiotics or antiviral medicines.  Severe cases may require hospitalization, mechanical ventilation, and other procedures to drain fluid from the lungs. This information is not intended to replace advice given to you by your health care provider. Make sure you discuss any questions you have with your health care provider. Document Released: 05/13/2005 Document Revised: 01/08/2018 Document Reviewed: 01/08/2018 Elsevier Patient Education  [place].   IMPORTANT INFORMATION: PAY CLOSE ATTENTION   PHYSICIAN DISCHARGE INSTRUCTIONS  Follow with Primary care provider  Laurey Morale, MD  and other consultants as instructed by your Hospitalist Physician  [place] IF SYMPTOMS COME BACK, WORSEN OR NEW PROBLEM DEVELOPS   Please note: You were cared for by a hospitalist during your hospital stay. Every effort will be made to forward records to your primary care provider.  You can request that your primary care provider send for your hospital records if they have not received them.  Once you are discharged, your primary care physician will handle any further medical issues. Please note that NO REFILLS for any discharge medications will be authorized once you are discharged, as it is imperative that you return to your primary care physician (or establish a relationship with a primary care physician  if you do not have one) for your post hospital discharge needs so that they can reassess your need for medications and monitor your lab values.  Please get a complete blood count and chemistry panel checked by your Primary MD at your next visit, and again as instructed by your Primary MD.  Get Medicines reviewed and adjusted: Please take all your medications with you for your next visit with your Primary MD  Laboratory/radiological data: Please request your Primary MD to go over all hospital  tests and procedure/radiological results at the follow up, please ask your primary care provider to get all Hospital records sent to his/her office.  In some cases, they will be blood work, cultures and biopsy results pending at the time of your discharge. Please request that your primary care provider follow up on these results.  If you are diabetic, please bring your blood sugar readings with you to your follow up appointment with primary care.    Please call and make your follow up appointments as soon as possible.    Also Note the following: If you experience worsening of your admission symptoms, develop shortness of breath, life threatening emergency, suicidal or homicidal thoughts you must seek medical attention immediately by calling 911 or calling your MD immediately  if symptoms less severe.  You must read complete instructions/literature along with all the possible adverse reactions/side effects for all the Medicines you take and that have been prescribed to you. Take any new Medicines after you have completely understood and accpet all the possible adverse reactions/side effects.   Do not drive when taking Pain medications or sleeping medications (Benzodiazepines)  Do not take more than prescribed Pain, Sleep and Anxiety Medications. It is not advisable to combine anxiety,sleep and pain medications without talking with your primary care practitioner  Special Instructions: If you have smoked or chewed Tobacco  in the last 2 yrs please stop smoking, stop any regular Alcohol  and or any Recreational drug use.  Wear Seat belts while driving.  Do not drive if taking any narcotic, mind altering or controlled substances or recreational drugs or alcohol.

## 2019-03-25 NOTE — Discharge Summary (Addendum)
Physician Discharge Summary  Audrey [name] ZOX:[id number] DOB: 22-Feb-1986 DOA: 03/22/2019  PCP: Nelwyn Salisbury, MD  Admit date: 03/22/2019 Discharge date: 03/25/2019  Admitted From:  Home  Disposition: Home   Recommendations for Outpatient Follow-up:  1. Follow up with PCP in 1 weeks 2. Please obtain follow up CXR in 1 month to ensure full resolution of pneumonia 3. Please follow up final culture data for urine and blood 4. Please check CBC and BMP in 1 week.   Discharge Condition: STABLE   CODE STATUS: FULL    Brief Hospitalization Summary: Please see all hospital notes, images, labs for full details of the hospitalization. Brief History:  33 year old female with no documented chronic medical problems presenting with 1 week history of fever, cough, malaise, and shortness of breath.  The patient had some nausea without any vomiting.  She also complained of some right flank pain but denied any frank hematuria, dysuria.  She stated that her fevers were up to 101.7 F.  She denied headache, neck pain, hemoptysis, diarrhea, hematochezia, melena.  Patient stated that she has had multiple coworkers with positive Covid tests.  In the emergency department, the patient was afebrile hemodynamically stable albeit with soft blood pressures.  Oxygen saturation was 94% on room air.  The patient was ambulated and became tachycardic and short of breath.  As result, admission was advised.  CT angiogram chest was negative for pulmonary embolus, but showed consolidation right lower lobe and right upper lobe.  Assessment/Plan: Multifocal lobar pneumonia -Continue ceftriaxone and azithromycin -Urine Legionella antigen negative  -Urine Streptococcus pneumonia antigen negative -Check influenza PCR ---NEG -SARS-CoV2--neg -added delsym for cough symptoms  UTI / Pyuria/flank pain -Treated with IV ceftriaxone, follow up final urine culture with PCP  Leukopenia -Suspect this may be due to acute  infectious process, possibly viral -Lower respiratory panel negative  -Influenza PCR negative  -WBC trending up, recheck with PCP when over acute illness -check RMSF - negative -check Ehrlichia serology - negative  Hypokalemia -oral potassium given to replete, recheck BMP with PCP   Disposition Plan:   Home  10/29 Family Communication:   No Family at bedside  Consultants:  none  Code Status:  FULL  DVT Prophylaxis:  [place] Lovenox Discharge Diagnoses:  Principal Problem:   Multifocal pneumonia Active Problems:   Pyelonephritis   Lymphocytopenia   Lobar pneumonia (HCC)   Discharge Instructions:  Allergies as of 03/25/2019      Reactions   Bee Venom Shortness Of Breath   Codeine Itching   Penicillins Hives   Has patient had a PCN reaction causing immediate rash, facial/tongue/throat swelling, SOB or lightheadedness with hypotension: no Has patient had a PCN reaction causing severe rash involving mucus membranes or skin necrosis: no Has patient had a PCN reaction that required hospitalization no Has patient had a PCN reaction occurring within the last 10 years: no If all of the above answers are "NO", then may proceed with Cephalosporin use.   Tape Hives   Bactrim [sulfamethoxazole-trimethoprim] Itching, Rash      Medication List    TAKE these medications   acetaminophen 325 MG tablet Commonly known as: TYLENOL Take 2 tablets (650 mg total) by mouth every 6 (six) hours as needed for fever.   cefdinir 300 MG capsule Commonly known as: OMNICEF Take 2 capsules (600 mg total) by mouth daily for 2 days.   dextromethorphan 30 MG/5ML liquid Commonly known as: DELSYM Take 10 mLs (60 mg total) by mouth 2 (two)  times daily as needed for cough.   fluconazole 150 MG tablet Commonly known as: DIFLUCAN Take 1 tablet (150 mg total) by mouth daily as needed (yeast infection).   ibuprofen 200 MG tablet Commonly known as: ADVIL Take 200 mg by mouth every 6 (six) hours as  needed for mild pain or moderate pain.   levofloxacin 750 MG tablet Commonly known as: Levaquin Take 1 tablet (750 mg total) by mouth daily for 4 days.   ondansetron 4 MG tablet Commonly known as: ZOFRAN Take 1 tablet (4 mg total) by mouth every 8 (eight) hours as needed for nausea or vomiting.   potassium chloride 10 MEQ tablet Commonly known as: KLOR-CON Take 1 tablet (10 mEq total) by mouth daily for 5 days.       Allergies  Allergen Reactions  . Bee Venom Shortness Of Breath  . Codeine Itching  . Penicillins Hives    Has patient had a PCN reaction causing immediate rash, facial/tongue/throat swelling, SOB or lightheadedness with hypotension: no Has patient had a PCN reaction causing severe rash involving mucus membranes or skin necrosis: no Has patient had a PCN reaction that required hospitalization no Has patient had a PCN reaction occurring within the last 10 years: no If all of the above answers are "NO", then may proceed with Cephalosporin use.   . Tape Hives  . Bactrim [Sulfamethoxazole-Trimethoprim] Itching and Rash   Allergies as of 03/25/2019      Reactions   Bee Venom Shortness Of Breath   Codeine Itching   Penicillins Hives   Has patient had a PCN reaction causing immediate rash, facial/tongue/throat swelling, SOB or lightheadedness with hypotension: no Has patient had a PCN reaction causing severe rash involving mucus membranes or skin necrosis: no Has patient had a PCN reaction that required hospitalization no Has patient had a PCN reaction occurring within the last 10 years: no If all of the above answers are "NO", then may proceed with Cephalosporin use.   Tape Hives   Bactrim [sulfamethoxazole-trimethoprim] Itching, Rash      Medication List    TAKE these medications   acetaminophen 325 MG tablet Commonly known as: TYLENOL Take 2 tablets (650 mg total) by mouth every 6 (six) hours as needed for fever.   cefdinir 300 MG capsule Commonly known  as: OMNICEF Take 2 capsules (600 mg total) by mouth daily for 2 days.   dextromethorphan 30 MG/5ML liquid Commonly known as: DELSYM Take 10 mLs (60 mg total) by mouth 2 (two) times daily as needed for cough.   fluconazole 150 MG tablet Commonly known as: DIFLUCAN Take 1 tablet (150 mg total) by mouth daily as needed (yeast infection).   ibuprofen 200 MG tablet Commonly known as: ADVIL Take 200 mg by mouth every 6 (six) hours as needed for mild pain or moderate pain.   levofloxacin 750 MG tablet Commonly known as: Levaquin Take 1 tablet (750 mg total) by mouth daily for 4 days.   ondansetron 4 MG tablet Commonly known as: ZOFRAN Take 1 tablet (4 mg total) by mouth every 8 (eight) hours as needed for nausea or vomiting.   potassium chloride 10 MEQ tablet Commonly known as: KLOR-CON Take 1 tablet (10 mEq total) by mouth daily for 5 days.       Procedures/Studies: Ct Angio Chest Pe W And/or Wo Contrast  Result Date: 03/22/2019 CLINICAL DATA:  Fever. Shortness of breath. EXAM: CT ANGIOGRAPHY CHEST WITH CONTRAST TECHNIQUE: Multidetector CT imaging of the chest was  performed using the standard protocol during bolus administration of intravenous contrast. Multiplanar CT image reconstructions and MIPs were obtained to evaluate the vascular anatomy. CONTRAST:  OMNIPAQUE IOHEXOL 350 MG/ML SOLN COMPARISON:  01/06/2017 FINDINGS: Cardiovascular: Contrast injection is sufficient to demonstrate satisfactory opacification of the pulmonary arteries to the segmental level. There is no pulmonary embolus. The main pulmonary artery is within normal limits for size. There is no CT evidence of acute right heart strain. The visualized aorta is normal. Heart size is normal, without pericardial effusion. Mediastinum/Nodes: --No mediastinal or hilar lymphadenopathy. --No axillary lymphadenopathy. --No supraclavicular lymphadenopathy. --Normal thyroid gland. --The esophagus is unremarkable Lungs/Pleura:  There is consolidation throughout the right lower lobe and to a lesser degree the right upper lobe. The left lung field is clear. There is no significant pleural effusion. The trachea is unremarkable. Upper Abdomen: No acute abnormality. Musculoskeletal: No chest wall abnormality. No acute or significant osseous findings. Review of the MIP images confirms the above findings. IMPRESSION: 1. Multifocal pneumonia involving the right lower lobe and right upper lobe. 2. No evidence for pulmonary embolus. Electronically Signed   By: Katherine Mantle M.D.   On: 03/22/2019 23:03   Dg Chest Portable 1 View  Result Date: 03/22/2019 CLINICAL DATA:  Fever, cough, shortness of breath EXAM: PORTABLE CHEST 1 VIEW COMPARISON:  01/06/2017 FINDINGS: The heart size and mediastinal contours are within normal limits. Both lungs are clear. The visualized skeletal structures are unremarkable. IMPRESSION: No active disease. Electronically Signed   By: Duanne Guess M.D.   On: 03/22/2019 17:30     Subjective: Pt says she is feeling better and ambulating better, denies SOB, cough getting better.    Discharge Exam: Vitals:   03/24/19 2200 03/25/19 0520  BP:  115/73  Pulse:  (!) 101  Resp:  16  Temp: 99.4 F (37.4 C) 98.6 F (37 C)  SpO2:  92%   Vitals:   03/24/19 1508 03/24/19 2011 03/24/19 2200 03/25/19 0520  BP: 116/66 114/74  115/73  Pulse: 86 (!) 108  (!) 101  Resp: 20 17  16   Temp: 98.9 F (37.2 C) (!) 100.5 F (38.1 C) 99.4 F (37.4 C) 98.6 F (37 C)  TempSrc: Oral Oral Oral   SpO2: 100% 95%  92%  Weight:      Height:       General: Pt is alert, awake, not in acute distress Cardiovascular: RRR, S1/S2 +, no rubs, no gallops Respiratory: BBS no wheezing, no rhonchi Abdominal: Soft, NT, ND, bowel sounds + Extremities: no edema, no cyanosis   The results of significant diagnostics from this hospitalization (including imaging, microbiology, ancillary and laboratory) are listed below for  reference.     Microbiology: Recent Results (from the past 240 hour(s))  Urine Culture     Status: Abnormal   Collection Time: 03/22/19  4:23 PM   Specimen: Urine, Random  Result Value Ref Range Status   Specimen Description   Final    URINE, RANDOM Performed at [facility], [address], [place], [place]    Special Requests   Final    NONE Performed at [facility], [address], [place], [place]    Culture >=100,000 COLONIES/mL ESCHERICHIA COLI (A)  Final   Report Status 03/25/2019 FINAL  Final   Organism ID, Bacteria ESCHERICHIA COLI (A)  Final      Susceptibility   Escherichia coli - MIC*    AMPICILLIN <=2 SENSITIVE Sensitive     CEFAZOLIN <=  4 SENSITIVE Sensitive     CEFTRIAXONE <=1 SENSITIVE Sensitive     CIPROFLOXACIN >=4 RESISTANT Resistant     GENTAMICIN <=1 SENSITIVE Sensitive     IMIPENEM <=0.25 SENSITIVE Sensitive     NITROFURANTOIN <=16 SENSITIVE Sensitive     TRIMETH/SULFA <=20 SENSITIVE Sensitive     AMPICILLIN/SULBACTAM <=2 SENSITIVE Sensitive     Extended ESBL NEGATIVE Sensitive     * >=100,000 COLONIES/mL ESCHERICHIA COLI  SARS CORONAVIRUS 2 (TAT 6-24 HRS) Nasopharyngeal Nasopharyngeal Swab     Status: None   Collection Time: 03/22/19  4:36 PM   Specimen: Nasopharyngeal Swab  Result Value Ref Range Status   SARS Coronavirus 2 NEGATIVE NEGATIVE Final    Comment: (NOTE) SARS-CoV-2 target nucleic acids are NOT DETECTED. The SARS-CoV-2 RNA is generally detectable in upper and lower respiratory specimens during the acute phase of infection. Negative results do not preclude SARS-CoV-2 infection, do not rule out co-infections with other pathogens, and should not be used as the sole basis for treatment or other patient management decisions. Negative results must be combined with clinical observations, patient history, and epidemiological information. The expected result is Negative. Fact Sheet for  Patients: SugarRoll.be Fact Sheet for Healthcare Providers: https://www.woods-mathews.com/ This test is not yet approved or cleared by the [place] FDA and  has been authorized for detection and/or diagnosis of SARS-CoV-2 by FDA under an Emergency Use Authorization (EUA). This EUA will remain  in effect (meaning this test can be used) for the duration of the COVID-19 declaration under Section 56 4(b)(1) of the Act, 21 U.S.C. section 360bbb-3(b)(1), unless the authorization is terminated or revoked sooner. Performed at [facility], [place] [address] [place], [place] [address]   Respiratory Panel by PCR     Status: None   Collection Time: 03/23/19 10:14 AM   Specimen: Nasopharyngeal Swab; Respiratory  Result Value Ref Range Status   Adenovirus NOT DETECTED NOT DETECTED Final   Coronavirus 229E NOT DETECTED NOT DETECTED Final    Comment: (NOTE) The Coronavirus on the Respiratory Panel, DOES NOT test for the novel  Coronavirus (2019 nCoV)    Coronavirus HKU1 NOT DETECTED NOT DETECTED Final   Coronavirus NL63 NOT DETECTED NOT DETECTED Final   Coronavirus OC43 NOT DETECTED NOT DETECTED Final   Metapneumovirus NOT DETECTED NOT DETECTED Final   Rhinovirus / Enterovirus NOT DETECTED NOT DETECTED Final   Influenza A NOT DETECTED NOT DETECTED Final   Influenza B NOT DETECTED NOT DETECTED Final   Parainfluenza Virus 1 NOT DETECTED NOT DETECTED Final   Parainfluenza Virus 2 NOT DETECTED NOT DETECTED Final   Parainfluenza Virus 3 NOT DETECTED NOT DETECTED Final   Parainfluenza Virus 4 NOT DETECTED NOT DETECTED Final   Respiratory Syncytial Virus NOT DETECTED NOT DETECTED Final   Bordetella pertussis NOT DETECTED NOT DETECTED Final   Chlamydophila pneumoniae NOT DETECTED NOT DETECTED Final   Mycoplasma pneumoniae NOT DETECTED NOT DETECTED Final    Comment: Performed at [facility], [place]. [address], [place], [place] [address]      Labs: BNP (last 3 results) No results for input(s): BNP in the last 8760 hours. Basic Metabolic Panel: Recent Labs  Lab 03/22/19 1705 03/23/19 0250 03/25/19 0643  NA 136 137 139  K 3.4* 3.2* 3.1*  CL 103 106 104  CO2 22 23 24   GLUCOSE 95 107* 112*  BUN 14 11 8   CREATININE 0.77 0.82 0.83  CALCIUM 8.2* 7.6* 8.0*  MG  --  1.8  1.8   Liver Function Tests: Recent Labs  Lab 03/23/19 1033  AST 38  ALT 63*  ALKPHOS 63  BILITOT 0.5  PROT 6.5  ALBUMIN 3.7   No results for input(s): LIPASE, AMYLASE in the last 168 hours. No results for input(s): AMMONIA in the last 168 hours. CBC: Recent Labs  Lab 03/22/19 1705 03/23/19 0250 03/24/19 0619 03/25/19 0643  WBC 1.9* 2.0* 2.6* 2.6*  NEUTROABS 1.2* 1.3* 1.9 2.0  HGB 13.7 12.4 11.7* 12.2  HCT 42.5 39.1 37.2 38.5  MCV 86.9 88.3 88.4 88.1  PLT 226 216 223 220   Cardiac Enzymes: No results for input(s): CKTOTAL, CKMB, CKMBINDEX, TROPONINI in the last 168 hours. BNP: Invalid input(s): POCBNP CBG: No results for input(s): GLUCAP in the last 168 hours. D-Dimer No results for input(s): DDIMER in the last 72 hours. Hgb A1c No results for input(s): HGBA1C in the last 72 hours. Lipid Profile No results for input(s): CHOL, HDL, LDLCALC, TRIG, CHOLHDL, LDLDIRECT in the last 72 hours. Thyroid function studies No results for input(s): TSH, T4TOTAL, T3FREE, THYROIDAB in the last 72 hours.  Invalid input(s): FREET3 Anemia work up No results for input(s): VITAMINB12, FOLATE, FERRITIN, TIBC, IRON, RETICCTPCT in the last 72 hours. Urinalysis    Component Value Date/Time   COLORURINE YELLOW 03/22/2019 1637   APPEARANCEUR CLOUDY (A) 03/22/2019 1637   APPEARANCEUR Cloudy (A) 05/15/2016 1629   LABSPEC 1.029 03/22/2019 1637   PHURINE 5.0 03/22/2019 1637   GLUCOSEU NEGATIVE 03/22/2019 1637   HGBUR NEGATIVE 03/22/2019 1637   BILIRUBINUR NEGATIVE 03/22/2019 1637   BILIRUBINUR Negative 05/15/2016 1629   KETONESUR 5 (A) 03/22/2019  1637   PROTEINUR 30 (A) 03/22/2019 1637   UROBILINOGEN 1.0 03/05/2014 1010   NITRITE POSITIVE (A) 03/22/2019 1637   LEUKOCYTESUR MODERATE (A) 03/22/2019 1637   Sepsis Labs Invalid input(s): PROCALCITONIN,  WBC,  LACTICIDVEN Microbiology Recent Results (from the past 240 hour(s))  Urine Culture     Status: Abnormal   Collection Time: 03/22/19  4:23 PM   Specimen: Urine, Random  Result Value Ref Range Status   Specimen Description   Final    URINE, RANDOM Performed at [facility], [address], [place], [place] [address]    Special Requests   Final    NONE Performed at [facility], [address], [place], [place] [address]    Culture >=100,000 COLONIES/mL ESCHERICHIA COLI (A)  Final   Report Status 03/25/2019 FINAL  Final   Organism ID, Bacteria ESCHERICHIA COLI (A)  Final      Susceptibility   Escherichia coli - MIC*    AMPICILLIN <=2 SENSITIVE Sensitive     CEFAZOLIN <=4 SENSITIVE Sensitive     CEFTRIAXONE <=1 SENSITIVE Sensitive     CIPROFLOXACIN >=4 RESISTANT Resistant     GENTAMICIN <=1 SENSITIVE Sensitive     IMIPENEM <=0.25 SENSITIVE Sensitive     NITROFURANTOIN <=16 SENSITIVE Sensitive     TRIMETH/SULFA <=20 SENSITIVE Sensitive     AMPICILLIN/SULBACTAM <=2 SENSITIVE Sensitive     Extended ESBL NEGATIVE Sensitive     * >=100,000 COLONIES/mL ESCHERICHIA COLI  SARS CORONAVIRUS 2 (TAT 6-24 HRS) Nasopharyngeal Nasopharyngeal Swab     Status: None   Collection Time: 03/22/19  4:36 PM   Specimen: Nasopharyngeal Swab  Result Value Ref Range Status   SARS Coronavirus 2 NEGATIVE NEGATIVE Final    Comment: (NOTE) SARS-CoV-2 target nucleic acids are NOT DETECTED. The SARS-CoV-2 RNA is generally detectable in upper and lower respiratory specimens during  the acute phase of infection. Negative results do not preclude SARS-CoV-2 infection, do not rule out co-infections with other pathogens, and should not be used as the sole basis for treatment or other patient management  decisions. Negative results must be combined with clinical observations, patient history, and epidemiological information. The expected result is Negative. Fact Sheet for Patients: HairSlick.no Fact Sheet for Healthcare Providers: quierodirigir.com This test is not yet approved or cleared by the [place] FDA and  has been authorized for detection and/or diagnosis of SARS-CoV-2 by FDA under an Emergency Use Authorization (EUA). This EUA will remain  in effect (meaning this test can be used) for the duration of the COVID-19 declaration under Section 56 4(b)(1) of the Act, 21 U.S.C. section 360bbb-3(b)(1), unless the authorization is terminated or revoked sooner. Performed at [facility], [address], [place], [place] [address]   Respiratory Panel by PCR     Status: None   Collection Time: 03/23/19 10:14 AM   Specimen: Nasopharyngeal Swab; Respiratory  Result Value Ref Range Status   Adenovirus NOT DETECTED NOT DETECTED Final   Coronavirus 229E NOT DETECTED NOT DETECTED Final    Comment: (NOTE) The Coronavirus on the Respiratory Panel, DOES NOT test for the novel  Coronavirus (2019 nCoV)    Coronavirus HKU1 NOT DETECTED NOT DETECTED Final   Coronavirus NL63 NOT DETECTED NOT DETECTED Final   Coronavirus OC43 NOT DETECTED NOT DETECTED Final   Metapneumovirus NOT DETECTED NOT DETECTED Final   Rhinovirus / Enterovirus NOT DETECTED NOT DETECTED Final   Influenza A NOT DETECTED NOT DETECTED Final   Influenza B NOT DETECTED NOT DETECTED Final   Parainfluenza Virus 1 NOT DETECTED NOT DETECTED Final   Parainfluenza Virus 2 NOT DETECTED NOT DETECTED Final   Parainfluenza Virus 3 NOT DETECTED NOT DETECTED Final   Parainfluenza Virus 4 NOT DETECTED NOT DETECTED Final   Respiratory Syncytial Virus NOT DETECTED NOT DETECTED Final   Bordetella pertussis NOT DETECTED NOT DETECTED Final   Chlamydophila pneumoniae NOT DETECTED  NOT DETECTED Final   Mycoplasma pneumoniae NOT DETECTED NOT DETECTED Final    Comment: Performed at [facility], [address], [place], [place] [address]    Time coordinating discharge: 31 mins  SIGNED:  Standley Dakins, MD  [facility] 03/25/2019, 9:54 AM How to contact the [facility] Attending or Consulting provider 7A - 7P or covering provider during after hours 7P -7A, for this patient?  1. Check the care team in [facility] and look for a) attending/consulting [facility] provider listed and b) the [facility] team listed 2. Log into [email or web address] and use [facility] universal password to access. If you do not have the password, please contact the hospital operator. 3. Locate the [facility] provider you are looking for under [facility] and page to a number that you can be directly reached. 4. If you still have difficulty reaching the provider, please page the [facility] (Director on Call) for the Hospitalists listed on amion for assistance.

## 2019-03-26 ENCOUNTER — Telehealth: Payer: Self-pay | Admitting: *Deleted

## 2019-03-26 NOTE — Telephone Encounter (Signed)
Attempted to contact patient for TCM (hospital f/u). Unable to leave a message since voice mail is full.

## 2019-04-02 ENCOUNTER — Encounter: Payer: Self-pay | Admitting: Family Medicine

## 2019-04-02 ENCOUNTER — Other Ambulatory Visit: Payer: Self-pay

## 2019-04-02 ENCOUNTER — Ambulatory Visit (INDEPENDENT_AMBULATORY_CARE_PROVIDER_SITE_OTHER): Payer: Self-pay | Admitting: Family Medicine

## 2019-04-02 VITALS — BP 106/60 | HR 101 | Temp 98.2°F | Ht 65.0 in | Wt 267.4 lb

## 2019-04-02 DIAGNOSIS — J189 Pneumonia, unspecified organism: Secondary | ICD-10-CM

## 2019-04-02 DIAGNOSIS — N390 Urinary tract infection, site not specified: Secondary | ICD-10-CM

## 2019-04-02 LAB — POCT URINALYSIS DIPSTICK
Bilirubin, UA: NEGATIVE
Blood, UA: NEGATIVE
Glucose, UA: NEGATIVE
Ketones, UA: NEGATIVE
Leukocytes, UA: NEGATIVE
Nitrite, UA: NEGATIVE
Protein, UA: POSITIVE — AB
Spec Grav, UA: 1.025 (ref 1.010–1.025)
Urobilinogen, UA: 0.2 E.U./dL
pH, UA: 6 (ref 5.0–8.0)

## 2019-04-02 NOTE — Patient Instructions (Signed)
Health Maintenance Due  Topic Date Due  . TETANUS/TDAP  02/10/2005  . PAP SMEAR-Modifier  08/24/2018  . INFLUENZA VACCINE  12/26/2018    Depression screen [facility] 2/9 10/26/2018 12/09/2017 05/15/2016  Decreased Interest 0 0 0  Down, Depressed, Hopeless 0 0 0  PHQ - 2 Score 0 0 0  Altered sleeping - - 0  Tired, decreased energy - - 0  Change in appetite - - 0  Feeling bad or failure about yourself  - - 0  Trouble concentrating - - 0  Moving slowly or fidgety/restless - - 0  Suicidal thoughts - - 0  PHQ-9 Score - - 0

## 2019-04-02 NOTE — Progress Notes (Signed)
   Subjective:    Patient ID: Audrey [name], female    DOB: 1985/07/11, 33 y.o.   MRN: [id number]  HPI Here to follow up a hospital stay from 03-22-19 to 03-25-19 for pneumonia and a UTI. She presented with fever, malaise, a dry cough, and SOB. She had no urinary symptoms. A CXR was clear but a CT angiogram of the chest showed pneumonia in the RUL and the R:LL. She was also found to have an E coli UTI. She tested negative for the Covid-19 virus. She was treated with IV Ceftriaxone and Azithromycin, then she was sent home on oral Cefdinir and Levofloxacin. She finished the antibiotics 5 days ago. She now feels much better with no cough or SOB, but she is still fatigued. Her appetite is back.    Review of Systems  Constitutional: Positive for fatigue. Negative for chills, diaphoresis and fever.  HENT: Negative.   Eyes: Negative.   Respiratory: Negative.   Cardiovascular: Negative.   Gastrointestinal: Negative.   Genitourinary: Negative.        Objective:   Physical Exam Constitutional:      Appearance: Normal appearance. She is not ill-appearing.  Cardiovascular:     Rate and Rhythm: Normal rate and regular rhythm.     Pulses: Normal pulses.     Heart sounds: Normal heart sounds.  Pulmonary:     Effort: Pulmonary effort is normal. No respiratory distress.     Breath sounds: Normal breath sounds. No stridor. No wheezing, rhonchi or rales.  Abdominal:     General: Abdomen is flat. Bowel sounds are normal. There is no distension.     Palpations: Abdomen is soft. There is no mass.     Tenderness: There is no abdominal tenderness. There is no guarding or rebound.     Hernia: No hernia is present.  Neurological:     General: No focal deficit present.     Mental Status: She is alert and oriented to person, place, and time.           Assessment & Plan:  She is recovering from a multifocal pneumonia and a UTI. Her lungs and urine are both clear today. She will rest and drink plenty  of fluids. We will bring her back in 2 weeks for another exam and a CXR. We likely will check for Covid antibodies at that time as well, since there is a chance she had a false negative test.  Alysia Penna, MD

## 2019-04-08 ENCOUNTER — Encounter: Payer: Self-pay | Admitting: Family Medicine

## 2019-04-13 NOTE — Telephone Encounter (Signed)
Please advise 

## 2019-04-13 NOTE — Telephone Encounter (Signed)
This has been a recurring lab error, nothing to worry about

## 2019-04-16 ENCOUNTER — Ambulatory Visit: Payer: Medicaid Other | Admitting: Family Medicine

## 2019-08-05 ENCOUNTER — Telehealth: Payer: Self-pay | Admitting: *Deleted

## 2019-08-05 NOTE — Telephone Encounter (Signed)
Patient called office stating the she has been having feet and leg swelling, causing pain in legs. Patient stated that she woke up today with swelling in her hands. Patient schedule virtual with pcp for 08/06/19 to discuss.

## 2019-08-06 ENCOUNTER — Other Ambulatory Visit: Payer: Self-pay

## 2019-08-06 ENCOUNTER — Telehealth (INDEPENDENT_AMBULATORY_CARE_PROVIDER_SITE_OTHER): Payer: Self-pay | Admitting: Family Medicine

## 2019-08-06 DIAGNOSIS — R609 Edema, unspecified: Secondary | ICD-10-CM

## 2019-08-06 MED ORDER — FUROSEMIDE 20 MG PO TABS: 20.0000 mg | ORAL_TABLET | Freq: Every day | ORAL | 0 refills | Status: DC

## 2019-08-06 NOTE — Progress Notes (Signed)
Virtual Visit via Telephone Note  I connected with the patient on 08/06/19 at  1:00 PM EST by telephone and verified that I am speaking with the correct person using two identifiers.   I discussed the limitations, risks, security and privacy concerns of performing an evaluation and management service by telephone and the availability of in person appointments. I also discussed with the patient that there may be a patient responsible charge related to this service. The patient expressed understanding and agreed to proceed.  Location patient: home Location provider: work or home office Participants present for the call: patient, provider Patient did not have a visit in the prior 7 days to address this/these issue(s).   History of Present Illness: Here for 2 weeks of swelling in the hands, legs,and feet. No redness or warmth. No fever. She feels mildly SOB, but no chest pain. No recent medication changes. She had Covid pneumonia last October.    Observations/Objective: Patient sounds cheerful and well on the phone. I do not appreciate any SOB. Speech and thought processing are grossly intact. Patient reported vitals:  Assessment and Plan: Fluid retention of uncertain etiology. She will take Lasix 20 mg daily. We will see her in the office next Wednesday for a full evaluation.  Gershon Crane, MD   Follow Up Instructions:     [phone] 5-10 [phone] 11-20 [phone] 21-30 I did not refer this patient for an OV in the next 24 hours for this/these issue(s).  I discussed the assessment and treatment plan with the patient. The patient was provided an opportunity to ask questions and all were answered. The patient agreed with the plan and demonstrated an understanding of the instructions.   The patient was advised to call back or seek an in-person evaluation if the symptoms worsen or if the condition fails to improve as anticipated.  I provided 12 minutes of non-face-to-face time during this  encounter.   Gershon Crane, MD

## 2019-08-11 ENCOUNTER — Ambulatory Visit (INDEPENDENT_AMBULATORY_CARE_PROVIDER_SITE_OTHER): Payer: Self-pay | Admitting: Family Medicine

## 2019-08-11 ENCOUNTER — Other Ambulatory Visit: Payer: Self-pay

## 2019-08-11 ENCOUNTER — Encounter: Payer: Self-pay | Admitting: Family Medicine

## 2019-08-11 VITALS — BP 122/84 | HR 100 | Temp 97.9°F | Ht 65.0 in | Wt 285.4 lb

## 2019-08-11 DIAGNOSIS — G43901 Migraine, unspecified, not intractable, with status migrainosus: Secondary | ICD-10-CM

## 2019-08-11 DIAGNOSIS — R600 Localized edema: Secondary | ICD-10-CM

## 2019-08-11 DIAGNOSIS — Z209 Contact with and (suspected) exposure to unspecified communicable disease: Secondary | ICD-10-CM

## 2019-08-11 LAB — HEPATIC FUNCTION PANEL
ALT: 10 U/L (ref 0–35)
AST: 9 U/L (ref 0–37)
Albumin: 4 g/dL (ref 3.5–5.2)
Alkaline Phosphatase: 83 U/L (ref 39–117)
Bilirubin, Direct: 0.1 mg/dL (ref 0.0–0.3)
Total Bilirubin: 0.4 mg/dL (ref 0.2–1.2)
Total Protein: 6.7 g/dL (ref 6.0–8.3)

## 2019-08-11 LAB — CBC WITH DIFFERENTIAL/PLATELET
Basophils Absolute: 0 10*3/uL (ref 0.0–0.1)
Basophils Relative: 0.6 % (ref 0.0–3.0)
Eosinophils Absolute: 0.1 10*3/uL (ref 0.0–0.7)
Eosinophils Relative: 1.7 % (ref 0.0–5.0)
HCT: 39.1 % (ref 36.0–46.0)
Hemoglobin: 13.1 g/dL (ref 12.0–15.0)
Lymphocytes Relative: 22.4 % (ref 12.0–46.0)
Lymphs Abs: 1.3 10*3/uL (ref 0.7–4.0)
MCHC: 33.6 g/dL (ref 30.0–36.0)
MCV: 85.8 fl (ref 78.0–100.0)
Monocytes Absolute: 0.4 10*3/uL (ref 0.1–1.0)
Monocytes Relative: 7 % (ref 3.0–12.0)
Neutro Abs: 4 10*3/uL (ref 1.4–7.7)
Neutrophils Relative %: 68.3 % (ref 43.0–77.0)
Platelets: 383 10*3/uL (ref 150.0–400.0)
RBC: 4.55 Mil/uL (ref 3.87–5.11)
RDW: 13.7 % (ref 11.5–15.5)
WBC: 5.9 10*3/uL (ref 4.0–10.5)

## 2019-08-11 LAB — URINALYSIS
Bilirubin Urine: NEGATIVE
Ketones, ur: NEGATIVE
Leukocytes,Ua: NEGATIVE
Nitrite: NEGATIVE
Specific Gravity, Urine: 1.02 (ref 1.000–1.030)
Total Protein, Urine: NEGATIVE
Urine Glucose: NEGATIVE
Urobilinogen, UA: 0.2 (ref 0.0–1.0)
pH: 7 (ref 5.0–8.0)

## 2019-08-11 LAB — BASIC METABOLIC PANEL
BUN: 14 mg/dL (ref 6–23)
CO2: 25 mEq/L (ref 19–32)
Calcium: 8.6 mg/dL (ref 8.4–10.5)
Chloride: 106 mEq/L (ref 96–112)
Creatinine, Ser: 0.83 mg/dL (ref 0.40–1.20)
GFR: 78.93 mL/min (ref >60.00)
Glucose, Bld: 92 mg/dL (ref 70–99)
Potassium: 4.2 mEq/L (ref 3.5–5.1)
Sodium: 139 mEq/L (ref 135–145)

## 2019-08-11 LAB — T4, FREE: Free T4: 1.04 ng/dL (ref 0.60–1.60)

## 2019-08-11 LAB — TSH: TSH: 2.1 u[IU]/mL (ref 0.35–4.50)

## 2019-08-11 LAB — SARS-COV-2 IGG: SARS-COV-2 IgG: 17.52

## 2019-08-11 LAB — BRAIN NATRIURETIC PEPTIDE: Pro B Natriuretic peptide (BNP): 28 pg/mL (ref 0.0–100.0)

## 2019-08-11 LAB — T3, FREE: T3, Free: 3.7 pg/mL (ref 2.3–4.2)

## 2019-08-11 MED ORDER — FUROSEMIDE 20 MG PO TABS: 20.0000 mg | ORAL_TABLET | Freq: Every day | ORAL | 5 refills | Status: DC

## 2019-08-11 MED ORDER — SUMATRIPTAN SUCCINATE 100 MG PO TABS: 100.0000 mg | ORAL_TABLET | Freq: Once | ORAL | 11 refills | Status: DC | PRN

## 2019-08-11 NOTE — Progress Notes (Signed)
   Subjective:    Patient ID: Audrey [name], female    DOB: April 24, 1986, 34 y.o.   MRN: [id number]  HPI Here to discuss fluid retention. She was hospitalized in October with a multifocal pneumonia, and although she has improved she says she never quite got back to normal after that. She tested negative for the Covid-19 virus then, but she was tested only once. About 3 weeks ago she began to retain a lot of fluid and she was swelling in the hands, feet, and legs, and she felt mildly SOB. We started her on Lasix 20 mg daily, and she has lost 9 lbs in last 6 days. She feels better, her hands and feet are less swollen, and she feels less SOB. She also mentions that she has been having more headaches in the past few months. She has taken Ibuprofen 2-3 days a week for headaches, and she remembers that she took Imitrex when she was younger.    Review of Systems  Constitutional: Negative.   Respiratory: Positive for shortness of breath. Negative for cough, chest tightness and wheezing.   Cardiovascular: Positive for leg swelling. Negative for chest pain and palpitations.  Gastrointestinal: Negative.   Genitourinary: Negative.   Neurological: Positive for headaches.       Objective:   Physical Exam Constitutional:      Appearance: She is obese. She is not ill-appearing.  Cardiovascular:     Rate and Rhythm: Normal rate and regular rhythm.     Pulses: Normal pulses.     Heart sounds: Normal heart sounds.  Pulmonary:     Effort: Pulmonary effort is normal. No respiratory distress.     Breath sounds: Normal breath sounds. No stridor. No wheezing, rhonchi or rales.  Musculoskeletal:     Comments: 2+ edema in both ankles   Neurological:     Mental Status: She is alert.           Assessment & Plan:  She has been retaining fluid and it is not clear if this is related to the infection she had last October or not. I still think it likely that she had a Covid virus infection and that this has  somehow led to the fluid retention. She will stay on Lasix for now. Get labs today to check for Covid anitbodies, renal and thyroid function, etc. She will try Imitrex for the headaches, which are likely migraines.  Gershon Crane, MD

## 2019-08-12 ENCOUNTER — Encounter: Payer: Self-pay | Admitting: Family Medicine

## 2019-08-13 NOTE — Telephone Encounter (Signed)
Because of her concerns, I think she would benefit from meeting with an Endocrinologist. If she wishes, I will do a referral

## 2019-09-15 ENCOUNTER — Telehealth: Payer: Self-pay | Admitting: Family Medicine

## 2019-09-15 MED ORDER — ALBUTEROL SULFATE HFA 108 (90 BASE) MCG/ACT IN AERS: 2.0000 | INHALATION_SPRAY | Freq: Four times a day (QID) | RESPIRATORY_TRACT | 1 refills | Status: DC | PRN

## 2019-09-15 NOTE — Telephone Encounter (Signed)
Ok to send in albuterol inhaler

## 2019-09-15 NOTE — Telephone Encounter (Signed)
Rx sent in. Patient is aware.  

## 2019-09-15 NOTE — Telephone Encounter (Signed)
Pt was prescribed albuterol last October when she had covid. Her allergies are acting up causing shortness of breath and she would like to know if Dr.Fry could prescribe her another one.    Pharmacy: [facility]

## 2020-03-15 ENCOUNTER — Ambulatory Visit: Payer: Medicaid Other | Admitting: Family Medicine

## 2020-03-30 ENCOUNTER — Encounter: Payer: Self-pay | Admitting: Family Medicine

## 2020-03-30 ENCOUNTER — Ambulatory Visit: Payer: Medicaid Other | Admitting: Family Medicine

## 2020-03-30 ENCOUNTER — Other Ambulatory Visit: Payer: Self-pay

## 2020-03-30 VITALS — BP 132/84 | HR 110 | Temp 98.6°F | Ht 64.0 in | Wt 296.0 lb

## 2020-03-30 DIAGNOSIS — F419 Anxiety disorder, unspecified: Secondary | ICD-10-CM

## 2020-03-30 DIAGNOSIS — Z0000 Encounter for general adult medical examination without abnormal findings: Secondary | ICD-10-CM

## 2020-03-30 DIAGNOSIS — Z209 Contact with and (suspected) exposure to unspecified communicable disease: Secondary | ICD-10-CM

## 2020-03-30 DIAGNOSIS — R739 Hyperglycemia, unspecified: Secondary | ICD-10-CM

## 2020-03-30 MED ORDER — ESCITALOPRAM OXALATE 10 MG PO TABS: 10.0000 mg | ORAL_TABLET | Freq: Every day | ORAL | 2 refills | Status: DC

## 2020-03-30 NOTE — Progress Notes (Signed)
Subjective:    Patient ID: Audrey [name], female    DOB: 05-17-1986, 34 y.o.   MRN: [id number]  HPI Here for a well exam. She has a few issues to discuss. First she deals with a lot of anxiety daily, and this has been getting worse. She gets very anxious around people, and she finds it very stressful to go into a grocery store, etc. She has stopped going to church because of this. She finds herself worrying about things and she gets irritable with her family. She has some spells of sadness but she does not seem to be very depressed. She sleeps well. She has had frequent headaches, and her BP sometimes goes up a little at home. She keeps gaining weight and she has not changed her diet. She gets little exercise.    Review of Systems  Constitutional: Positive for unexpected weight change.  HENT: Negative.   Eyes: Negative.   Respiratory: Negative.   Cardiovascular: Negative.   Gastrointestinal: Negative.   Genitourinary: Negative for decreased urine volume, difficulty urinating, dyspareunia, dysuria, enuresis, flank pain, frequency, hematuria, pelvic pain and urgency.  Musculoskeletal: Negative.   Skin: Negative.   Neurological: Positive for headaches.  Psychiatric/Behavioral: Negative.        Objective:   Physical Exam Constitutional:      General: She is not in acute distress.    Appearance: She is well-developed. She is obese.  HENT:     Head: Normocephalic and atraumatic.     Right Ear: External ear normal.     Left Ear: External ear normal.     Nose: Nose normal.     Mouth/Throat:     Pharynx: No oropharyngeal exudate.  Eyes:     General: No scleral icterus.    Conjunctiva/sclera: Conjunctivae normal.     Pupils: Pupils are equal, round, and reactive to light.  Neck:     Thyroid: No thyromegaly.     Vascular: No JVD.  Cardiovascular:     Rate and Rhythm: Normal rate and regular rhythm.     Heart sounds: Normal heart sounds. No murmur heard.  No friction rub. No  gallop.   Pulmonary:     Effort: Pulmonary effort is normal. No respiratory distress.     Breath sounds: Normal breath sounds. No wheezing or rales.  Chest:     Chest wall: No tenderness.  Abdominal:     General: Bowel sounds are normal. There is no distension.     Palpations: Abdomen is soft. There is no mass.     Tenderness: There is no abdominal tenderness. There is no guarding or rebound.  Musculoskeletal:        General: No tenderness. Normal range of motion.     Cervical back: Normal range of motion and neck supple.  Lymphadenopathy:     Cervical: No cervical adenopathy.  Skin:    General: Skin is warm and dry.     Findings: No erythema or rash.  Neurological:     Mental Status: She is alert and oriented to person, place, and time.     Cranial Nerves: No cranial nerve deficit.     Motor: No abnormal muscle tone.     Coordination: Coordination normal.     Deep Tendon Reflexes: Reflexes are normal and symmetric. Reflexes normal.  Psychiatric:        Mood and Affect: Mood normal.        Behavior: Behavior normal.        Thought  Content: Thought content normal.        Judgment: Judgment normal.           Assessment & Plan:  Well exam. We discussed diet and exercise. Get fasting labs including a thyroid panel. Has has anxiety, and we will treat this with Lexapro 10 mg daily. Recheck in 3-4 weeks.  Gershon Crane, MD

## 2020-03-31 LAB — URINALYSIS
Bilirubin Urine: NEGATIVE
Glucose, UA: NEGATIVE
Hgb urine dipstick: NEGATIVE
Ketones, ur: NEGATIVE
Leukocytes,Ua: NEGATIVE
Nitrite: POSITIVE — AB
Protein, ur: NEGATIVE
Specific Gravity, Urine: 1.023 (ref 1.001–1.03)
pH: 6 (ref 5.0–8.0)

## 2020-03-31 LAB — CBC WITH DIFFERENTIAL/PLATELET
Absolute Monocytes: 421 cells/uL (ref 200–950)
Basophils Absolute: 18 cells/uL (ref 0–200)
Basophils Relative: 0.3 %
Eosinophils Absolute: 110 cells/uL (ref 15–500)
Eosinophils Relative: 1.8 %
HCT: 41 % (ref 35.0–45.0)
Hemoglobin: 13.7 g/dL (ref 11.7–15.5)
Lymphs Abs: 1324 cells/uL (ref 850–3900)
MCH: 28.8 pg (ref 27.0–33.0)
MCHC: 33.4 g/dL (ref 32.0–36.0)
MCV: 86.3 fL (ref 80.0–100.0)
MPV: 8.8 fL (ref 7.5–12.5)
Monocytes Relative: 6.9 %
Neutro Abs: 4227 cells/uL (ref 1500–7800)
Neutrophils Relative %: 69.3 %
Platelets: 371 10*3/uL (ref 140–400)
RBC: 4.75 10*6/uL (ref 3.80–5.10)
RDW: 12.7 % (ref 11.0–15.0)
Total Lymphocyte: 21.7 %
WBC: 6.1 10*3/uL (ref 3.8–10.8)

## 2020-03-31 LAB — HEPATIC FUNCTION PANEL
AG Ratio: 1.9 (calc) (ref 1.0–2.5)
ALT: 13 U/L (ref 6–29)
AST: 11 U/L (ref 10–30)
Albumin: 4.6 g/dL (ref 3.6–5.1)
Alkaline phosphatase (APISO): 72 U/L (ref 31–125)
Bilirubin, Direct: 0.1 mg/dL (ref 0.0–0.2)
Globulin: 2.4 g/dL (calc) (ref 1.9–3.7)
Indirect Bilirubin: 0.5 mg/dL (calc) (ref 0.2–1.2)
Total Bilirubin: 0.6 mg/dL (ref 0.2–1.2)
Total Protein: 7 g/dL (ref 6.1–8.1)

## 2020-03-31 LAB — LIPID PANEL
Cholesterol: 209 mg/dL — ABNORMAL HIGH (ref <200)
HDL: 37 mg/dL — ABNORMAL LOW (ref >=50)
LDL Cholesterol (Calc): 140 mg/dL (calc) — ABNORMAL HIGH
Non-HDL Cholesterol (Calc): 172 mg/dL (calc) — ABNORMAL HIGH (ref <130)
Total CHOL/HDL Ratio: 5.6 (calc) — ABNORMAL HIGH (ref <5.0)
Triglycerides: 183 mg/dL — ABNORMAL HIGH (ref <150)

## 2020-03-31 LAB — BASIC METABOLIC PANEL WITH GFR
BUN: 13 mg/dL (ref 7–25)
CO2: 23 mmol/L (ref 20–32)
Calcium: 9 mg/dL (ref 8.6–10.2)
Chloride: 103 mmol/L (ref 98–110)
Creat: 0.83 mg/dL (ref 0.50–1.10)
GFR, Est African American: 107 mL/min/{1.73_m2} (ref >=60)
GFR, Est Non African American: 92 mL/min/{1.73_m2} (ref >=60)
Glucose, Bld: 90 mg/dL (ref 65–99)
Potassium: 4 mmol/L (ref 3.5–5.3)
Sodium: 138 mmol/L (ref 135–146)

## 2020-03-31 LAB — SARS COV-2 SEROLOGY(COVID-19)AB(IGG,IGM),IMMUNOASSAY
SARS CoV-2 AB IgG: NEGATIVE
SARS CoV-2 IgM: NEGATIVE

## 2020-03-31 LAB — TSH: TSH: 2.43 mIU/L

## 2020-03-31 LAB — HEMOGLOBIN A1C
Hgb A1c MFr Bld: 4.9 % of total Hgb (ref <5.7)
Mean Plasma Glucose: 94 (calc)
eAG (mmol/L): 5.2 (calc)

## 2020-03-31 LAB — T3, FREE: T3, Free: 3.4 pg/mL (ref 2.3–4.2)

## 2020-03-31 LAB — T4, FREE: Free T4: 1.2 ng/dL (ref 0.8–1.8)

## 2020-04-04 ENCOUNTER — Other Ambulatory Visit: Payer: Self-pay

## 2020-04-04 MED ORDER — CIPROFLOXACIN HCL 500 MG PO TABS: 500.0000 mg | ORAL_TABLET | Freq: Two times a day (BID) | ORAL | 0 refills | Status: AC

## 2020-04-12 ENCOUNTER — Encounter: Payer: Self-pay | Admitting: Family Medicine

## 2020-04-12 NOTE — Telephone Encounter (Signed)
Yes I think Vertical Sleeve Gastrectomy would be a good choice for her to pursue. If she has a Careers adviser in mind, I would be happy to do a referral

## 2020-04-27 ENCOUNTER — Encounter: Payer: Self-pay | Admitting: Family Medicine

## 2020-05-01 NOTE — Telephone Encounter (Signed)
She would need to ask her GYN about this

## 2020-05-02 ENCOUNTER — Telehealth: Payer: Self-pay | Admitting: Adult Health

## 2020-05-02 NOTE — Telephone Encounter (Signed)
Patient  would like for Victorino Dike to call her in some progesterone. Pt states that she has been on her period for 14 days now and it has been very heavy. Pt states that she does not have cramps but she is not able to leave her home because of how heavy it is.

## 2020-05-04 ENCOUNTER — Telehealth: Payer: Self-pay | Admitting: Adult Health

## 2020-05-04 NOTE — Telephone Encounter (Signed)
mychart message sent to [facility] to reply to patient.

## 2020-05-04 NOTE — Telephone Encounter (Signed)
Patient wanted to know if she could be seen sooner than the 05/16/2020. Patient stated that she thought Dr. Valentina Lucks wanted to see her sooner. Front office informed patient of the 05/16/2020 afternoon appt. That was available with Dr. Valentina Lucks.

## 2020-05-05 ENCOUNTER — Other Ambulatory Visit: Payer: Self-pay

## 2020-05-05 ENCOUNTER — Ambulatory Visit (INDEPENDENT_AMBULATORY_CARE_PROVIDER_SITE_OTHER): Payer: Self-pay | Admitting: Adult Health

## 2020-05-05 ENCOUNTER — Encounter: Payer: Self-pay | Admitting: Adult Health

## 2020-05-05 VITALS — BP 132/87 | HR 108 | Ht 65.0 in | Wt 296.0 lb

## 2020-05-05 DIAGNOSIS — N921 Excessive and frequent menstruation with irregular cycle: Secondary | ICD-10-CM

## 2020-05-05 DIAGNOSIS — D500 Iron deficiency anemia secondary to blood loss (chronic): Secondary | ICD-10-CM

## 2020-05-05 LAB — POCT HEMOGLOBIN: Hemoglobin: 9.8 g/dL — AB (ref 11–14.6)

## 2020-05-05 LAB — POCT URINE PREGNANCY: Preg Test, Ur: NEGATIVE

## 2020-05-05 MED ORDER — MEGESTROL ACETATE 40 MG PO TABS: ORAL_TABLET | ORAL | 1 refills | Status: DC

## 2020-05-05 MED ORDER — FLINTSTONES COMPLETE 10 MG PO CHEW: 2.0000 | CHEWABLE_TABLET | Freq: Every day | ORAL | Status: DC

## 2020-05-05 NOTE — Progress Notes (Signed)
  Subjective:     Patient ID: Audrey [name], female   DOB: 23-Aug-1985, 34 y.o.   MRN: [id number]  HPI Audrey [name] is a 34 year old white female, married, [id number] in complaining of bleeding since 04/03/20, with clots, no cramps. She skipped her period in June and July and had 5 day period in August and then skipped September and October. It is lighter today. She is dizzy at times and tired. PCP is Audrey [name].  Review of Systems Bleeding since 04/13/20 See HPI for positives Reviewed past medical,surgical, social and family history. Reviewed medications and allergies.      Objective:   Physical Exam BP 132/87 (BP Location: Left Arm, Patient Position: Sitting, Cuff Size: Normal)   Pulse (!) 108   Ht 5\' 5"  (1.651 m)   Wt 296 lb (134.3 kg)   LMP 04/13/2020 Comment: bleeding since that time  BMI 49.26 kg/m UPT is negative HGB 9.8.   Skin warm and dry.. Lungs: clear to ausculation bilaterally. Cardiovascular: regular rate and rhythm. Pelvic: external genitalia is normal in appearance no lesions, vagina: pink blood without odor,urethra has no lesions or masses noted, cervix:smooth and bulbous, uterus: normal size, shape and contour, non tender, no masses felt, adnexa: no masses or tenderness noted. Bladder is non tender and no masses felt.   Examination chaperoned by 04/15/2020 LPN  [facility] - 05/05/20 1100      Pregnancy Intention Screening   Does the patient want to become pregnant in the next year? No    Does the patient's partner want to become pregnant in the next year? No    Would the patient like to discuss contraceptive options today? No      Contraception Wrap Up   Current Method Female Condom    End Method Female Condom    Contraception Counseling Provided Yes          Assessment:     1. Menorrhagia with irregular cycle Will try to stop bleeding with megace  Meds ordered this encounter  Medications  . megestrol (MEGACE) 40 MG tablet    Sig: Take 3 x 5 days then 2 x 5 days then daily     Dispense:  45 tablet    Refill:  1    Order Specific Question:   Supervising Provider    Answer:   14/10/21, LUTHER H [[id number]]  . Pediatric Multivitamins-Iron (FLINTSTONES COMPLETE) 10 MG CHEW    Sig: Chew 2 tablets by mouth daily.    Order Specific Question:   Supervising Provider    Answer:   Despina Hidden [[id number]]   Discussed ablation, and handout given   2. Iron deficiency anemia due to chronic blood loss take 2 Flintstones daily     Plan:     Return in 6 weeks for pap and physical and ROS

## 2020-06-21 ENCOUNTER — Other Ambulatory Visit: Payer: Medicaid Other | Admitting: Adult Health

## 2020-08-10 ENCOUNTER — Other Ambulatory Visit (HOSPITAL_COMMUNITY)
Admission: RE | Admit: 2020-08-10 | Discharge: 2020-08-10 | Disposition: A | Discharge status: Home / Self Care | Payer: Medicaid Other | Source: Ambulatory Visit | Attending: Adult Health | Admitting: Adult Health

## 2020-08-10 ENCOUNTER — Other Ambulatory Visit: Payer: Self-pay

## 2020-08-10 ENCOUNTER — Ambulatory Visit (INDEPENDENT_AMBULATORY_CARE_PROVIDER_SITE_OTHER): Payer: Medicaid Other | Admitting: Adult Health

## 2020-08-10 ENCOUNTER — Encounter: Payer: Self-pay | Admitting: Adult Health

## 2020-08-10 VITALS — BP 133/93 | HR 111 | Ht 64.0 in | Wt 294.0 lb

## 2020-08-10 DIAGNOSIS — Z3009 Encounter for other general counseling and advice on contraception: Secondary | ICD-10-CM

## 2020-08-10 DIAGNOSIS — Z3202 Encounter for pregnancy test, result negative: Secondary | ICD-10-CM

## 2020-08-10 DIAGNOSIS — Z0000 Encounter for general adult medical examination without abnormal findings: Secondary | ICD-10-CM | POA: Insufficient documentation

## 2020-08-10 DIAGNOSIS — R635 Abnormal weight gain: Secondary | ICD-10-CM | POA: Diagnosis not present

## 2020-08-10 DIAGNOSIS — Z01419 Encounter for gynecological examination (general) (routine) without abnormal findings: Secondary | ICD-10-CM

## 2020-08-10 DIAGNOSIS — N926 Irregular menstruation, unspecified: Secondary | ICD-10-CM | POA: Diagnosis not present

## 2020-08-10 DIAGNOSIS — Z113 Encounter for screening for infections with a predominantly sexual mode of transmission: Secondary | ICD-10-CM | POA: Diagnosis not present

## 2020-08-10 DIAGNOSIS — R030 Elevated blood-pressure reading, without diagnosis of hypertension: Secondary | ICD-10-CM | POA: Insufficient documentation

## 2020-08-10 DIAGNOSIS — L853 Xerosis cutis: Secondary | ICD-10-CM

## 2020-08-10 LAB — POCT URINE PREGNANCY: Preg Test, Ur: NEGATIVE

## 2020-08-10 MED ORDER — MEDROXYPROGESTERONE ACETATE 10 MG PO TABS: ORAL_TABLET | ORAL | 0 refills | Status: DC

## 2020-08-10 NOTE — Progress Notes (Signed)
Patient ID: Audrey [name], female   DOB: 1985-10-21, 35 y.o.   MRN: [id number] History of Present Illness: Audrey [name] is a 35 year old white female,married, [id number] in for a well woman gyn exam and pap. She is complaining of weight gain, no energy dry skin, irregular periods, no period in 3 months, and feels sleepy and feet swell.. She had labs with PCP in November, and has dyslipidemia. She wants thyroid antibodies checked today, too. She is Clinical research associate estate.  PCP is Dr Clent Ridges.  Current Medications, Allergies, Past Medical History, Past Surgical History, Family History and Social History were reviewed in [employer] record.     Review of Systems: Patient denies any headaches, hearing loss,  blurred vision, shortness of breath, chest pain, abdominal pain, problems with bowel movements, urination, or intercourse. No joint pain or mood swings. See HPI for positives.    Physical Exam:BP (!) 133/93 (BP Location: Right Arm, Patient Position: Sitting, Cuff Size: Large)   Pulse (!) 111   Ht 5\' 4"  (1.626 m)   Wt 294 lb (133.4 kg)   BMI 50.46 kg/m UPT is negative. General:  Well developed, well nourished, no acute distress Skin:  Warm and dry Neck:  Midline trachea, normal thyroid, good ROM, no lymphadenopathy Lungs; Clear to auscultation bilaterally Breast:  No dominant palpable mass, retraction, or nipple discharge Cardiovascular: Regular rate and rhythm Abdomen:  Soft, non tender, no hepatosplenomegaly Pelvic:  External genitalia is normal in appearance, no lesions.  The vagina is normal in appearance. Urethra has no lesions or masses. The cervix is bulbous.Pap with GC/CHL and HR HPV genotyping performed.  Uterus is felt to be normal size, shape, and contour.  No adnexal masses or tenderness noted.Bladder is non tender, no masses felt. Extremities/musculoskeletal:  No swelling or varicosities noted, no clubbing or cyanosis Psych:  No mood changes, alert and cooperative,seems  happy AA is 0  Fall risk is low PHQ 9 score is 10 GAD 7 score is 7  [facility] - 08/10/20 1048      Pregnancy Intention Screening   Does the patient want to become pregnant in the next year? No    Does the patient's partner want to become pregnant in the next year? No    Would the patient like to discuss contraceptive options today? No      Contraception Wrap Up   Current Method Female Condom    End Method Female Condom    Contraception Counseling Provided No         Examination chaperoned by 08/12/20 LPN   Impression and Plan: 1. Pregnancy examination or test, negative result  2. Routine medical exam Pap sent   3. Irregular periods Check thyroid antibody Will rx provera 10 mg x 10 days to get withdrawal bleed Meds ordered this encounter  Medications  . medroxyPROGESTERone (PROVERA) 10 MG tablet    Sig: Take 1 daily for 10 days    Dispense:  10 tablet    Refill:  0    Order Specific Question:   Supervising Provider    Answer:   Malachy Mood [[id number]]  Will follow up in 3 weeks   4. Encounter for gynecological examination with Papanicolaou smear of cervix Pap sent Physical in 1 year oao in 3 if normal   5. Family planning   6. Screening examination for STD (sexually transmitted disease) Check HIV,RPR,and hepatitis C anitbody  7. Weight gain Check thyroid antibody  8. Dry skin Check thyroid antibody  9. Elevated BP without diagnosis of hypertension Recheck in 3 weeks

## 2020-08-11 LAB — CYTOLOGY - PAP
Adequacy: ABSENT
Chlamydia: NEGATIVE
Comment: NEGATIVE
Comment: NEGATIVE
Comment: NORMAL
Diagnosis: NEGATIVE
High risk HPV: NEGATIVE
Neisseria Gonorrhea: NEGATIVE

## 2020-08-11 LAB — HEPATITIS C ANTIBODY: Hep C Virus Ab: <0.1 s/co ratio (ref 0.0–0.9)

## 2020-08-11 LAB — HIV ANTIBODY (ROUTINE TESTING W REFLEX): HIV Screen 4th Generation wRfx: NONREACTIVE

## 2020-08-11 LAB — THYROID PEROXIDASE ANTIBODY: Thyroperoxidase Ab SerPl-aCnc: <8 IU/mL (ref 0–34)

## 2020-08-11 LAB — RPR: RPR Ser Ql: NONREACTIVE

## 2020-08-31 ENCOUNTER — Ambulatory Visit: Payer: Medicaid Other | Admitting: Adult Health

## 2020-09-06 ENCOUNTER — Other Ambulatory Visit: Payer: Self-pay | Admitting: Adult Health

## 2020-09-06 MED ORDER — PROMETHAZINE HCL 25 MG PO TABS: 25.0000 mg | ORAL_TABLET | Freq: Four times a day (QID) | ORAL | 1 refills | Status: DC | PRN

## 2020-09-06 NOTE — Progress Notes (Signed)
Will rx phenergan and try 3 advil and 2 tylenol with caffeine drink for headache

## 2020-09-22 ENCOUNTER — Other Ambulatory Visit: Payer: Self-pay

## 2020-09-22 ENCOUNTER — Ambulatory Visit (INDEPENDENT_AMBULATORY_CARE_PROVIDER_SITE_OTHER): Payer: 59 | Admitting: Adult Health

## 2020-09-22 ENCOUNTER — Encounter: Payer: Self-pay | Admitting: Adult Health

## 2020-09-22 VITALS — BP 140/100 | HR 111 | Ht 64.0 in | Wt 294.0 lb

## 2020-09-22 DIAGNOSIS — I10 Essential (primary) hypertension: Secondary | ICD-10-CM | POA: Diagnosis not present

## 2020-09-22 MED ORDER — LOSARTAN POTASSIUM-HCTZ 50-12.5 MG PO TABS: 1.0000 | ORAL_TABLET | Freq: Every day | ORAL | 3 refills | Status: DC

## 2020-09-22 NOTE — Progress Notes (Signed)
  Subjective:     Patient ID: Audrey [name], female   DOB: 06/26/1985, 35 y.o.   MRN: [id number]  HPI Audrey [name] is a 35 year old white female, married, [id number], in for BP check and see if started period after taking provera and she did, still spotting. PCP is Dr Clent Ridges.  Review of Systems  Did get period after provera and still spotting Has had headache Has swelling in feet   Reviewed past medical,surgical, social and family history. Reviewed medications and allergies.     Objective:   Physical Exam BP (!) 140/100 (BP Location: Left Arm, Cuff Size: Large)   Pulse (!) 111   Ht 5\' 4"  (1.626 m)   Wt 294 lb (133.4 kg)   LMP 09/01/2020 Comment: took provera  BMI 50.46 kg/m    Skin warm and dry. Lungs: clear to ausculation bilaterally. Cardiovascular: regular rate and rhythm.  [facility] - 09/22/20 1041      Pregnancy Intention Screening   Does the patient want to become pregnant in the next year? No    Does the patient's partner want to become pregnant in the next year? No    Would the patient like to discuss contraceptive options today? No      Contraception Wrap Up   Current Method Female Condom    End Method Female Condom    Contraception Counseling Provided No          Assessment:     1. Hypertension, unspecified type Will try hyzaar Meds ordered this encounter  Medications  . losartan-hydrochlorothiazide (HYZAAR) 50-12.5 MG tablet    Sig: Take 1 tablet by mouth daily.    Dispense:  30 tablet    Refill:  3    Order Specific Question:   Supervising Provider    Answer:   09/24/20 H [[id number]]  Decrease salt and sugars Given DASH diet Get BP cuff for home and keep log of BP    Plan:     Follow up in 2 weeks for BP check and ROS

## 2020-09-22 NOTE — Patient Instructions (Signed)

## 2020-10-06 ENCOUNTER — Ambulatory Visit: Payer: 59 | Admitting: Adult Health

## 2020-12-07 ENCOUNTER — Encounter: Payer: Self-pay | Admitting: Family Medicine

## 2020-12-07 DIAGNOSIS — Z6841 Body Mass Index (BMI) 40.0 and over, adult: Secondary | ICD-10-CM

## 2020-12-08 NOTE — Telephone Encounter (Signed)
I did the referral to Dr. Olin Pia

## 2021-01-19 ENCOUNTER — Ambulatory Visit: Payer: 59 | Admitting: Family Medicine

## 2021-01-30 ENCOUNTER — Ambulatory Visit (INDEPENDENT_AMBULATORY_CARE_PROVIDER_SITE_OTHER): Payer: 59 | Admitting: Family Medicine

## 2021-01-30 ENCOUNTER — Other Ambulatory Visit: Payer: Self-pay

## 2021-01-30 ENCOUNTER — Encounter: Payer: Self-pay | Admitting: Family Medicine

## 2021-01-30 VITALS — BP 110/90 | HR 86 | Temp 98.0°F | Ht 64.0 in | Wt 291.0 lb

## 2021-01-30 DIAGNOSIS — Z0000 Encounter for general adult medical examination without abnormal findings: Secondary | ICD-10-CM

## 2021-01-30 LAB — LIPID PANEL
Cholesterol: 185 mg/dL (ref 0–200)
HDL: 32.4 mg/dL — ABNORMAL LOW (ref >39.00)
LDL Cholesterol: 127 mg/dL — ABNORMAL HIGH (ref 0–99)
NonHDL: 152.59
Total CHOL/HDL Ratio: 6
Triglycerides: 128 mg/dL (ref 0.0–149.0)
VLDL: 25.6 mg/dL (ref 0.0–40.0)

## 2021-01-30 LAB — TSH: TSH: 2.71 u[IU]/mL (ref 0.35–5.50)

## 2021-01-30 LAB — BASIC METABOLIC PANEL
BUN: 10 mg/dL (ref 6–23)
CO2: 25 mEq/L (ref 19–32)
Calcium: 8.9 mg/dL (ref 8.4–10.5)
Chloride: 104 mEq/L (ref 96–112)
Creatinine, Ser: 0.91 mg/dL (ref 0.40–1.20)
GFR: 82.04 mL/min (ref >60.00)
Glucose, Bld: 89 mg/dL (ref 70–99)
Potassium: 4 mEq/L (ref 3.5–5.1)
Sodium: 137 mEq/L (ref 135–145)

## 2021-01-30 LAB — CBC WITH DIFFERENTIAL/PLATELET
Basophils Absolute: 0 10*3/uL (ref 0.0–0.1)
Basophils Relative: 0.6 % (ref 0.0–3.0)
Eosinophils Absolute: 0.1 10*3/uL (ref 0.0–0.7)
Eosinophils Relative: 2 % (ref 0.0–5.0)
HCT: 34.2 % — ABNORMAL LOW (ref 36.0–46.0)
Hemoglobin: 10.6 g/dL — ABNORMAL LOW (ref 12.0–15.0)
Lymphocytes Relative: 28 % (ref 12.0–46.0)
Lymphs Abs: 1.4 10*3/uL (ref 0.7–4.0)
MCHC: 31 g/dL (ref 30.0–36.0)
MCV: 70.2 fl — ABNORMAL LOW (ref 78.0–100.0)
Monocytes Absolute: 0.4 10*3/uL (ref 0.1–1.0)
Monocytes Relative: 7.7 % (ref 3.0–12.0)
Neutro Abs: 3.2 10*3/uL (ref 1.4–7.7)
Neutrophils Relative %: 61.7 % (ref 43.0–77.0)
Platelets: 409 10*3/uL — ABNORMAL HIGH (ref 150.0–400.0)
RBC: 4.88 Mil/uL (ref 3.87–5.11)
RDW: 17.2 % — ABNORMAL HIGH (ref 11.5–15.5)
WBC: 5.2 10*3/uL (ref 4.0–10.5)

## 2021-01-30 LAB — HEPATIC FUNCTION PANEL
ALT: 11 U/L (ref 0–35)
AST: 11 U/L (ref 0–37)
Albumin: 4 g/dL (ref 3.5–5.2)
Alkaline Phosphatase: 80 U/L (ref 39–117)
Bilirubin, Direct: 0.1 mg/dL (ref 0.0–0.3)
Total Bilirubin: 0.4 mg/dL (ref 0.2–1.2)
Total Protein: 7.2 g/dL (ref 6.0–8.3)

## 2021-01-30 LAB — HEMOGLOBIN A1C: Hgb A1c MFr Bld: 5.3 % (ref 4.6–6.5)

## 2021-01-30 LAB — T4, FREE: Free T4: 0.93 ng/dL (ref 0.60–1.60)

## 2021-01-30 LAB — T3, FREE: T3, Free: 3.3 pg/mL (ref 2.3–4.2)

## 2021-01-30 NOTE — Addendum Note (Signed)
Addended by: Kandra Nicolas on: 01/30/2021 01:37 PM   Modules accepted: Orders

## 2021-01-30 NOTE — Progress Notes (Signed)
   Subjective:    Patient ID: Audrey [name], female    DOB: 10-Aug-1985, 35 y.o.   MRN: [id number]  HPI Here for a well exam. She feels fine. She is seeing a Teacher, English as a foreign language by the name of Dr. Olin Pia of [facility] in [place], and they are in the process of setting up a sleeve gastrectomy.    Review of Systems  Constitutional: Negative.   HENT: Negative.    Eyes: Negative.   Respiratory: Negative.    Cardiovascular: Negative.   Gastrointestinal: Negative.   Genitourinary:  Negative for decreased urine volume, difficulty urinating, dyspareunia, dysuria, enuresis, flank pain, frequency, hematuria, pelvic pain and urgency.  Musculoskeletal: Negative.   Skin: Negative.   Neurological: Negative.  Negative for headaches.  Psychiatric/Behavioral: Negative.        Objective:   Physical Exam Constitutional:      General: She is not in acute distress.    Appearance: She is well-developed. She is obese.  HENT:     Head: Normocephalic and atraumatic.     Right Ear: External ear normal.     Left Ear: External ear normal.     Nose: Nose normal.     Mouth/Throat:     Pharynx: No oropharyngeal exudate.  Eyes:     General: No scleral icterus.    Conjunctiva/sclera: Conjunctivae normal.     Pupils: Pupils are equal, round, and reactive to light.  Neck:     Thyroid: No thyromegaly.     Vascular: No JVD.  Cardiovascular:     Rate and Rhythm: Normal rate and regular rhythm.     Heart sounds: Normal heart sounds. No murmur heard.   No friction rub. No gallop.  Pulmonary:     Effort: Pulmonary effort is normal. No respiratory distress.     Breath sounds: Normal breath sounds. No wheezing or rales.  Chest:     Chest wall: No tenderness.  Abdominal:     General: Bowel sounds are normal. There is no distension.     Palpations: Abdomen is soft. There is no mass.     Tenderness: There is no abdominal tenderness. There is no guarding or rebound.  Musculoskeletal:        General: No  tenderness. Normal range of motion.     Cervical back: Normal range of motion and neck supple.  Lymphadenopathy:     Cervical: No cervical adenopathy.  Skin:    General: Skin is warm and dry.     Findings: No erythema or rash.  Neurological:     Mental Status: She is alert and oriented to person, place, and time.     Cranial Nerves: No cranial nerve deficit.     Motor: No abnormal muscle tone.     Coordination: Coordination normal.     Deep Tendon Reflexes: Reflexes are normal and symmetric. Reflexes normal.  Psychiatric:        Behavior: Behavior normal.        Thought Content: Thought content normal.        Judgment: Judgment normal.          Assessment & Plan:  Well exam. We discussed diet and exercise. Get fasting labs. I think she is a good candidate for a bariatric procedure.  Gershon Crane, MD

## 2021-02-02 ENCOUNTER — Other Ambulatory Visit: Payer: Self-pay

## 2021-02-02 MED ORDER — IRON (FERROUS SULFATE) 325 (65 FE) MG PO TABS: 325.0000 mg | ORAL_TABLET | Freq: Every day | ORAL | 3 refills | Status: DC

## 2021-02-14 ENCOUNTER — Encounter: Payer: Self-pay | Admitting: Emergency Medicine

## 2021-02-14 ENCOUNTER — Ambulatory Visit
Admission: EM | Admit: 2021-02-14 | Discharge: 2021-02-14 | Disposition: A | Discharge status: Home / Self Care | Payer: 59 | Attending: Physician Assistant | Admitting: Physician Assistant

## 2021-02-14 ENCOUNTER — Other Ambulatory Visit: Payer: Self-pay

## 2021-02-14 DIAGNOSIS — J069 Acute upper respiratory infection, unspecified: Secondary | ICD-10-CM

## 2021-02-14 MED ORDER — ALBUTEROL SULFATE HFA 108 (90 BASE) MCG/ACT IN AERS: 2.0000 | INHALATION_SPRAY | Freq: Four times a day (QID) | RESPIRATORY_TRACT | 2 refills | Status: DC | PRN

## 2021-02-14 NOTE — ED Triage Notes (Signed)
Pt reports Sx's started last Friday. Pt has a cough,congestion Lt /Rt ear pain and sore throat.

## 2021-02-15 LAB — COVID-19, FLU A+B NAA
Influenza A, NAA: NOT DETECTED
Influenza B, NAA: NOT DETECTED
SARS-CoV-2, NAA: NOT DETECTED

## 2021-02-21 ENCOUNTER — Encounter: Payer: Self-pay | Admitting: Family Medicine

## 2021-02-21 NOTE — Telephone Encounter (Signed)
The form is ready  

## 2021-02-21 NOTE — ED Provider Notes (Signed)
[facility]    CSN: [id number] Arrival date & time: 02/14/21  1430      History   Chief Complaint Chief Complaint  Patient presents with   Cough   Sore Throat   Nasal Congestion   Otalgia    HPI Audrey [name] is a 35 y.o. female.   The history is provided by the patient. No language interpreter was used.  Cough Cough characteristics:  Non-productive Severity:  Moderate Onset quality:  Gradual Duration:  3 days Chronicity:  New Smoker: no   Relieved by:  Nothing Associated symptoms: ear pain   Sore Throat  Otalgia Associated symptoms: cough    Past Medical History:  Diagnosis Date   Allergy    Anxiety    BV (bacterial vaginosis) 12/30/2012   Chicken pox    Chronic kidney disease    Contraceptive management 07/15/2013   Hay fever    History of kidney stones    Hypertension    Irregular bleeding 12/30/2012   Leukorrhea 10/07/2013   resolved s/p cryotherapy     Migraines    PONV (postoperative nausea and vomiting)    Postcoital bleeding 05/13/2013   Had [place] and IUD in place, bleeding stopped after doxycycline but started back, has normal period lite x 2 days but bleeds every time with sex, is bright red and has clots and cramps after sex    UTI (lower urinary tract infection) 08/24/2014   UTI (urinary tract infection)     Patient Active Problem List   Diagnosis Date Noted   Hypertension 09/22/2020   Dry skin 08/10/2020   Weight gain 08/10/2020   Encounter for gynecological examination with Papanicolaou smear of cervix 08/10/2020   Irregular periods 08/10/2020   Routine medical exam 08/10/2020   Pregnancy examination or test, negative result 08/10/2020   Elevated BP without diagnosis of hypertension 08/10/2020   Menorrhagia with irregular cycle 05/05/2020   Bilateral leg edema 08/11/2019   Multifocal pneumonia 03/23/2019   Lymphocytopenia 03/23/2019   Lobar pneumonia (HCC) 03/23/2019   BMI 45.0-49.9, adult (HCC) 10/26/2018   Weight loss  counseling, encounter for 10/26/2018   Irritable bowel syndrome with diarrhea 10/26/2018   Encounter for well woman exam with routine gynecological exam 12/09/2017   Family planning 12/09/2017   Screening examination for STD (sexually transmitted disease) 12/09/2017   Encounter for initial prescription of contraceptive pills 12/09/2017   SVD (spontaneous vaginal delivery) 01/01/2017   Pre-eclampsia 12/31/2016   Normal labor 12/31/2016   Preterm labor 12/04/2016   History of preterm delivery, currently pregnant 06/17/2016   History of cryosurgery of cervix affecting pregnancy 06/17/2016   BMI 40.0-44.9, adult (HCC) 06/15/2016   Pyelonephritis 06/15/2016   Attention deficit hyperactivity disorder (ADHD) 04/24/2015   BV (bacterial vaginosis) 12/30/2012   Anxiety 06/08/2012   Migraines 09/25/2010    Past Surgical History:  Procedure Laterality Date   CHOLECYSTECTOMY N/A 03/09/2014   Procedure: LAPAROSCOPIC CHOLECYSTECTOMY;  Surgeon: Dalia Heading, MD;  Location: [facility];  Service: General;  Laterality: N/A;   DILATION AND CURETTAGE OF UTERUS     TONSILLECTOMY      OB History     Gravida  5   Para  4   Term  3   Preterm  1   AB  1   Living  3      SAB  1   IAB      Ectopic      Multiple      Live Births  3            Home Medications    Prior to Admission medications   Medication Sig Start Date End Date Taking? Authorizing Provider  albuterol (VENTOLIN HFA) 108 (90 Base) MCG/ACT inhaler Inhale 2 puffs into the lungs every 6 (six) hours as needed for wheezing or shortness of breath. 02/14/21  Yes Elson Areas, PA-C  Iron, Ferrous Sulfate, 325 (65 Fe) MG TABS Take 325 mg by mouth daily. 02/02/21   Nelwyn Salisbury, MD  Multiple Vitamin (MULTIVITAMIN) tablet Take 1 tablet by mouth daily. Takes 2 gummies daily    [provider]    Family History Family History  Problem Relation Age of Onset   Arthritis Mother    Lung cancer Mother     Hyperlipidemia Mother    Atrial fibrillation Mother    Stroke Mother    Hypertension Mother    Diabetes Father    Dementia Father    Breast cancer Maternal Aunt    Diabetes Paternal Uncle     Social History Social History   Tobacco Use   Smoking status: Never   Smokeless tobacco: Never  Vaping Use   Vaping Use: Never used  Substance Use Topics   Alcohol use: No    Alcohol/week: 0.0 standard drinks   Drug use: No     Allergies   Bee venom, Codeine, Penicillins, Tape, and Bactrim [sulfamethoxazole-trimethoprim]   Review of Systems Review of Systems  HENT:  Positive for ear pain.   Respiratory:  Positive for cough.   All other systems reviewed and are negative.   Physical Exam Triage Vital Signs ED Triage Vitals  Enc Vitals Group     BP 02/14/21 1516 125/85     Pulse Rate 02/14/21 1516 (!) 105     Resp --      Temp 02/14/21 1516 99.2 F (37.3 C)     Temp src --      SpO2 02/14/21 1516 98 %     Weight --      Height --      Head Circumference --      Peak Flow --      Pain Score 02/14/21 1518 0     Pain Loc --      Pain Edu? --      Excl. in GC? --    No data found.  Updated Vital Signs BP 125/85   Pulse (!) 105   Temp 99.2 F (37.3 C)   LMP 01/28/2021 (Exact Date)   SpO2 98%   Visual Acuity Right Eye Distance:   Left Eye Distance:   Bilateral Distance:    Right Eye Near:   Left Eye Near:    Bilateral Near:     Physical Exam Vitals and nursing note reviewed.  Constitutional:      Appearance: She is well-developed.  HENT:     Head: Normocephalic.     Mouth/Throat:     Mouth: Mucous membranes are moist.  Cardiovascular:     Rate and Rhythm: Normal rate.  Pulmonary:     Effort: Pulmonary effort is normal.  Abdominal:     General: There is no distension.  Musculoskeletal:        General: Normal range of motion.     Cervical back: Normal range of motion.  Skin:    General: Skin is warm.  Neurological:     Mental Status: She is  alert and oriented to person, place, and time.  UC Treatments / Results  Labs (all labs ordered are listed, but only abnormal results are displayed) Labs Reviewed  COVID-19, FLU A+B NAA   Narrative:    Performed at:  [address] [place], [place]  [id number] Lab Director: Jolene Schimke MD, Phone:  [phone]    EKG   Radiology No results found.  Procedures Procedures (including critical care time)  Medications Ordered in UC Medications - No data to display  Initial Impression / Assessment and Plan / UC Course  I have reviewed the triage vital signs and the nursing notes.  Pertinent labs & imaging results that were available during my care of the patient were reviewed by me and considered in my medical decision making (see chart for details).      Final Clinical Impressions(s) / UC Diagnoses   Final diagnoses:  Viral URI   Discharge Instructions   None    ED Prescriptions     Medication Sig Dispense Auth. Provider   albuterol (VENTOLIN HFA) 108 (90 Base) MCG/ACT inhaler Inhale 2 puffs into the lungs every 6 (six) hours as needed for wheezing or shortness of breath. 8 g Elson Areas, [place]      PDMP not reviewed this encounter. An After Visit Summary was printed and given to the patient.    Elson Areas, [place] 02/21/21 [phone]

## 2021-02-21 NOTE — Telephone Encounter (Signed)
Pt form has been completed and faxed to the number provided on the form, pt was notified through [facility]

## 2021-03-12 ENCOUNTER — Inpatient Hospital Stay (HOSPITAL_COMMUNITY): Admission: RE | Admit: 2021-03-12 | Payer: 59 | Source: Ambulatory Visit

## 2021-03-12 ENCOUNTER — Encounter (HOSPITAL_COMMUNITY): Payer: Self-pay

## 2021-03-12 DIAGNOSIS — Z1231 Encounter for screening mammogram for malignant neoplasm of breast: Secondary | ICD-10-CM

## 2021-03-14 ENCOUNTER — Other Ambulatory Visit: Payer: Self-pay | Admitting: Family Medicine

## 2021-03-16 NOTE — Telephone Encounter (Signed)
Rx is not on pt list of medication left pt a detailed message to call the office with an update

## 2021-03-16 NOTE — Telephone Encounter (Signed)
Left detailed message for pt regarding the support clearance form, advised pt that the form received was not clear and pt might consider picking up the form from the office since we have no way to changed the original copy

## 2021-03-19 ENCOUNTER — Telehealth: Payer: Self-pay | Admitting: Family Medicine

## 2021-03-19 NOTE — Telephone Encounter (Signed)
Patient brought in paperwork that she needs Dr.Fry to complete. She stated that it was sent and done through MyChart but it was dark. Paperwork will be placed in the folder.  Paperwork could be faxed to [phone] when completed. It could be attn to Madge/ Nadine/ and/or Robin.  Please advise.

## 2021-03-20 NOTE — Telephone Encounter (Signed)
The form is ready  

## 2021-03-21 NOTE — Telephone Encounter (Signed)
Pt forms was faxed as requested, pt has been notified

## 2021-04-26 HISTORY — PX: LAPAROSCOPIC GASTRIC SLEEVE RESECTION: SHX5895

## 2021-05-10 ENCOUNTER — Telehealth: Payer: Self-pay | Admitting: Family Medicine

## 2021-05-10 NOTE — Telephone Encounter (Signed)
Left pt a message to call the office and schedule appointment with another Dr at the office since pt PCP is not in the office

## 2021-05-10 NOTE — Telephone Encounter (Signed)
Pt call and stated she need something for yeast infection sent to  [facility] [place] [place] [facility] - [address] Phone:  [phone]  Fax:  [phone]

## 2021-05-12 ENCOUNTER — Other Ambulatory Visit: Payer: Self-pay

## 2021-05-12 ENCOUNTER — Emergency Department (HOSPITAL_COMMUNITY): Payer: 59

## 2021-05-12 ENCOUNTER — Emergency Department (HOSPITAL_COMMUNITY)
Admission: EM | Admit: 2021-05-12 | Discharge: 2021-05-12 | Disposition: A | Discharge status: Home / Self Care | Payer: 59 | Attending: Emergency Medicine | Admitting: Emergency Medicine

## 2021-05-12 ENCOUNTER — Encounter (HOSPITAL_COMMUNITY): Payer: Self-pay

## 2021-05-12 ENCOUNTER — Ambulatory Visit
Admission: EM | Admit: 2021-05-12 | Discharge: 2021-05-12 | Disposition: A | Discharge status: Other Acute Inpatient Hospital | Payer: 59

## 2021-05-12 DIAGNOSIS — E876 Hypokalemia: Secondary | ICD-10-CM | POA: Insufficient documentation

## 2021-05-12 DIAGNOSIS — M79601 Pain in right arm: Secondary | ICD-10-CM

## 2021-05-12 DIAGNOSIS — N189 Chronic kidney disease, unspecified: Secondary | ICD-10-CM | POA: Diagnosis not present

## 2021-05-12 DIAGNOSIS — R0789 Other chest pain: Secondary | ICD-10-CM | POA: Diagnosis not present

## 2021-05-12 DIAGNOSIS — I809 Phlebitis and thrombophlebitis of unspecified site: Secondary | ICD-10-CM

## 2021-05-12 DIAGNOSIS — I129 Hypertensive chronic kidney disease with stage 1 through stage 4 chronic kidney disease, or unspecified chronic kidney disease: Secondary | ICD-10-CM | POA: Diagnosis not present

## 2021-05-12 DIAGNOSIS — R52 Pain, unspecified: Secondary | ICD-10-CM

## 2021-05-12 LAB — CBC WITH DIFFERENTIAL/PLATELET
Abs Immature Granulocytes: 0.01 10*3/uL (ref 0.00–0.07)
Basophils Absolute: 0 10*3/uL (ref 0.0–0.1)
Basophils Relative: 1 %
Eosinophils Absolute: 0.1 10*3/uL (ref 0.0–0.5)
Eosinophils Relative: 2 %
HCT: 44.2 % (ref 36.0–46.0)
Hemoglobin: 14 g/dL (ref 12.0–15.0)
Immature Granulocytes: 0 %
Lymphocytes Relative: 25 %
Lymphs Abs: 1.4 10*3/uL (ref 0.7–4.0)
MCH: 25.9 pg — ABNORMAL LOW (ref 26.0–34.0)
MCHC: 31.7 g/dL (ref 30.0–36.0)
MCV: 81.9 fL (ref 80.0–100.0)
Monocytes Absolute: 0.4 10*3/uL (ref 0.1–1.0)
Monocytes Relative: 7 %
Neutro Abs: 3.6 10*3/uL (ref 1.7–7.7)
Neutrophils Relative %: 65 %
Platelets: 364 10*3/uL (ref 150–400)
RBC: 5.4 MIL/uL — ABNORMAL HIGH (ref 3.87–5.11)
RDW: 17 % — ABNORMAL HIGH (ref 11.5–15.5)
WBC: 5.6 10*3/uL (ref 4.0–10.5)
nRBC: 0 % (ref 0.0–0.2)

## 2021-05-12 LAB — BASIC METABOLIC PANEL
Anion gap: 14 (ref 5–15)
BUN: 12 mg/dL (ref 6–20)
CO2: 21 mmol/L — ABNORMAL LOW (ref 22–32)
Calcium: 8.9 mg/dL (ref 8.9–10.3)
Chloride: 102 mmol/L (ref 98–111)
Creatinine, Ser: 0.72 mg/dL (ref 0.44–1.00)
GFR, Estimated: >60 mL/min (ref >60)
Glucose, Bld: 89 mg/dL (ref 70–99)
Potassium: 3.2 mmol/L — ABNORMAL LOW (ref 3.5–5.1)
Sodium: 137 mmol/L (ref 135–145)

## 2021-05-12 NOTE — ED Notes (Signed)
Rechecked pulse because too high during triage.

## 2021-05-12 NOTE — ED Notes (Signed)
Received GI cocktail IV to right arm on Monday from a facility and arm started hurting on Tuesday.  Rating pain 7/10 with pain radiating up to right shoulder.

## 2021-05-12 NOTE — ED Triage Notes (Signed)
Pt presents to ED with complaints of right arm pain after receiving IV infusion on Monday.

## 2021-05-12 NOTE — ED Provider Notes (Addendum)
[facility] EMERGENCY DEPARTMENT Provider Note   CSN: [id number] Arrival date & time: 05/12/21  1032     History Chief Complaint  Patient presents with   Arm Pain    Audrey [name] is a 35 y.o. 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  She was seen by her surgeon 5 days ago for staple removal at which time she was felt to be dehydrated and was given IV fluids.  Within hours after receiving this fluid she started to develop pain which started at the right elbow and now has intermittent muscle spasm like pains randomly throughout her arm but radiate as high as her right shoulder and collarbone region.  She denies shortness of breath, no fevers or chills, denies palpitations, weakness or dizziness.  She has no pleuritic symptoms.  These episodes will last for several minutes and then resolve spontaneously.  They are not associated with any particular activity, movement, muscle flexing etc.  She states the symptoms have woke her at night.  She was seen at an urgent care center and was sent here to evaluate for possible DVT.  The history is provided by the patient.      Past Medical History:  Diagnosis Date   Allergy    Anxiety    BV (bacterial vaginosis) 12/30/2012   Chicken pox    Chronic kidney disease    Contraceptive management 07/15/2013   Hay fever    History of kidney stones    Hypertension    Irregular bleeding 12/30/2012   Leukorrhea 10/07/2013   resolved s/p cryotherapy     Migraines    PONV (postoperative nausea and vomiting)    Postcoital bleeding 05/13/2013   Had [place] and IUD in place, bleeding stopped after doxycycline but started back, has normal period lite x 2 days but bleeds every time with sex, is bright red and has clots and cramps after sex    UTI (lower urinary tract infection) 08/24/2014   UTI  (urinary tract infection)     Patient Active Problem List   Diagnosis Date Noted   Hypertension 09/22/2020   Dry skin 08/10/2020   Weight gain 08/10/2020   Encounter for gynecological examination with Papanicolaou smear of cervix 08/10/2020   Irregular periods 08/10/2020   Routine medical exam 08/10/2020   Pregnancy examination or test, negative result 08/10/2020   Elevated BP without diagnosis of hypertension 08/10/2020   Menorrhagia with irregular cycle 05/05/2020   Bilateral leg edema 08/11/2019   Multifocal pneumonia 03/23/2019   Lymphocytopenia 03/23/2019   Lobar pneumonia (HCC) 03/23/2019   BMI 45.0-49.9, adult (HCC) 10/26/2018   Weight loss counseling, encounter for 10/26/2018   Irritable bowel syndrome with diarrhea 10/26/2018   Encounter for well woman exam with routine gynecological exam 12/09/2017   Family planning 12/09/2017   Screening examination for STD (sexually transmitted disease) 12/09/2017   Encounter for initial prescription of contraceptive pills 12/09/2017   SVD (spontaneous vaginal delivery) 01/01/2017   Pre-eclampsia 12/31/2016   Normal labor 12/31/2016   Preterm labor 12/04/2016   History of preterm delivery, currently pregnant 06/17/2016   History of cryosurgery of cervix affecting pregnancy 06/17/2016   BMI 40.0-44.9, adult (HCC) 06/15/2016   Pyelonephritis 06/15/2016   Attention deficit hyperactivity disorder (ADHD) 04/24/2015   BV (bacterial vaginosis) 12/30/2012   Anxiety 06/08/2012   Migraines 09/25/2010    Past  Surgical History:  Procedure Laterality Date   CHOLECYSTECTOMY N/A 03/09/2014   Procedure: LAPAROSCOPIC CHOLECYSTECTOMY;  Surgeon: Dalia Heading, MD;  Location: [facility];  Service: General;  Laterality: N/A;   DILATION AND CURETTAGE OF UTERUS     TONSILLECTOMY       OB History     Gravida  5   Para  4   Term  3   Preterm  1   AB  1   Living  3      SAB  1   IAB      Ectopic      Multiple      Live Births  3            Family History  Problem Relation Age of Onset   Arthritis Mother    Lung cancer Mother    Hyperlipidemia Mother    Atrial fibrillation Mother    Stroke Mother    Hypertension Mother    Diabetes Father    Dementia Father    Breast cancer Maternal Aunt    Diabetes Paternal Uncle     Social History   Tobacco Use   Smoking status: Never   Smokeless tobacco: Never  Vaping Use   Vaping Use: Never used  Substance Use Topics   Alcohol use: No    Alcohol/week: 0.0 standard drinks   Drug use: No    Home Medications Prior to Admission medications   Medication Sig Start Date End Date Taking? Authorizing Provider  albuterol (VENTOLIN HFA) 108 (90 Base) MCG/ACT inhaler Inhale 2 puffs into the lungs every 6 (six) hours as needed for wheezing or shortness of breath. Patient not taking: Reported on 05/12/2021 02/14/21   Elson Areas, PA-C  Iron, Ferrous Sulfate, 325 (65 Fe) MG TABS Take 325 mg by mouth daily. Patient not taking: Reported on 05/12/2021 02/02/21   Nelwyn Salisbury, MD  Multiple Vitamin (MULTIVITAMIN) tablet Take 1 tablet by mouth daily. Takes 2 gummies daily Patient not taking: Reported on 05/12/2021    [provider]    Allergies    Bee venom, Codeine, Penicillins, Tape, and Bactrim [sulfamethoxazole-trimethoprim]  Review of Systems   Review of Systems  Constitutional:  Negative for chills and fever.  HENT:  Negative for congestion and sore throat.   Eyes: Negative.   Respiratory:  Positive for chest tightness. Negative for shortness of breath.   Cardiovascular:  Negative for chest pain.  Gastrointestinal:  Negative for abdominal pain, nausea and vomiting.  Genitourinary: Negative.   Musculoskeletal:  Positive for arthralgias. Negative for joint swelling and neck pain.  Skin: Negative.  Negative for color change, rash and wound.  Neurological:  Negative for dizziness, weakness, light-headedness, numbness and headaches.   Psychiatric/Behavioral: Negative.     Physical Exam Updated Vital Signs BP (!) 112/98 (BP Location: Left Arm)    Pulse 83    Temp 98 F (36.7 C) (Oral)    Resp 16    Ht 5\' 4"  (1.626 m)    Wt 113.4 kg    SpO2 100%    BMI 42.91 kg/m   Physical Exam Vitals and nursing note reviewed.  Constitutional:      Appearance: She is well-developed.  HENT:     Head: Normocephalic and atraumatic.  Eyes:     Conjunctiva/sclera: Conjunctivae normal.  Cardiovascular:     Rate and Rhythm: Normal rate and regular rhythm.     Pulses:  Radial pulses are 2+ on the right side and 2+ on the left side.     Heart sounds: Normal heart sounds.    No friction rub.  Pulmonary:     Effort: Pulmonary effort is normal. No respiratory distress.     Breath sounds: Normal breath sounds. No stridor. No wheezing or rhonchi.  Abdominal:     General: Bowel sounds are normal.     Palpations: Abdomen is soft.     Tenderness: There is no abdominal tenderness. There is no guarding.  Musculoskeletal:        General: Swelling and tenderness present. Normal range of motion.     Cervical back: Normal range of motion.     Comments: Ttp inferior right bicep region with healing IV site right antecubital space.  No bruising at this site.  Old bruise right medial forearm from prior IV (from initial surgery).  No reproducible pain proximal bicep, triceps shoulder.  No erythema or red streaking.  Had initial patch of erythema over distal triceps region but resolved during initial interview (pt had been rubbing this site).  No edema in the arm.   Skin:    General: Skin is warm and dry.  Neurological:     Mental Status: She is alert.    ED Results / Procedures / Treatments   Labs (all labs ordered are listed, but only abnormal results are displayed) Labs Reviewed  BASIC METABOLIC PANEL - Abnormal; Notable for the following components:      Result Value   Potassium 3.2 (*)    CO2 21 (*)    All other components within  normal limits  CBC WITH DIFFERENTIAL/PLATELET - Abnormal; Notable for the following components:   RBC 5.40 (*)    MCH 25.9 (*)    RDW 17.0 (*)    All other components within normal limits    EKG None  Radiology US Venous Img Upper Uni Right(DVT)  Result Date: 05/12/2021 CLINICAL DATA:  Right upper extremity pain and edema for the past 5 days. Evaluate for DVT. EXAM: RIGHT UPPER EXTREMITY VENOUS DOPPLER ULTRASOUND TECHNIQUE: Gray-scale sonography with graded compression, as well as color Doppler and duplex ultrasound were performed to evaluate the upper extremity deep venous system from the level of the subclavian vein and including the jugular, axillary, basilic, radial, ulnar and upper cephalic vein. Spectral Doppler was utilized to evaluate flow at rest and with distal augmentation maneuvers. COMPARISON:  None. FINDINGS: Contralateral Subclavian Vein: Respiratory phasicity is normal and symmetric with the symptomatic side. No evidence of thrombus. Normal compressibility. Internal Jugular Vein: No evidence of thrombus. Normal compressibility, respiratory phasicity and response to augmentation. Subclavian Vein: No evidence of thrombus. Normal compressibility, respiratory phasicity and response to augmentation. Axillary Vein: No evidence of thrombus. Normal compressibility, respiratory phasicity and response to augmentation. Cephalic Vein: No evidence of thrombus. Normal compressibility, respiratory phasicity and response to augmentation. Basilic Vein: No evidence of thrombus. Normal compressibility, respiratory phasicity and response to augmentation. Brachial Veins: No evidence of thrombus. Normal compressibility, respiratory phasicity and response to augmentation. Radial Veins: No evidence of thrombus. Normal compressibility, respiratory phasicity and response to augmentation. Ulnar Veins: No evidence of thrombus. Normal compressibility, respiratory phasicity and response to augmentation. Venous  Reflux:  None visualized. Other Findings:  None visualized. IMPRESSION: No evidence of DVT within the right upper extremity. Electronically Signed   By: Simonne Come M.D.   On: 05/12/2021 12:07    Procedures Procedures   Medications Ordered in ED Medications -  No data to display  ED Course  I have reviewed the triage vital signs and the nursing notes.  Pertinent labs & imaging results that were available during my care of the patient were reviewed by me and considered in my medical decision making (see chart for details).    MDM Rules/Calculators/A&P                         Labs and imaging reviewed and discussed with patient.  She does not have a DVT, no evidence for superficial thrombophlebitis based on today's ultrasound study.  She may have had some extravasation of saline that was infused on Monday, possibly causing her irritation today.  She was encouraged to apply warm compresses to the site, plan to see her PCP for recheck, she has an appointment for follow-up in 3 days.  She does have a mild hypokalemia at 3.2.  She is currently on liquid only diet, is able to increase to pured starting tomorrow.  We are concerned that oral potassium supplementation may interfere with her healing process from her recent surgery.  She was encouraged to pure potassium rich foods to help replace this deficit, list of food choices were given.  Patient has no shortness of breath, her vital signs are stable normal pulse ox normal respiratory rate, doubt PE, denies pleuritic character of her symptoms.    Final Clinical Impression(s) / ED Diagnoses Final diagnoses:  Pain  Right arm pain  Hypokalemia    Rx / DC Orders ED Discharge Orders     None        Victoriano Lain 05/12/21 1550    Burgess Amor, PA-C 05/12/21 1551    Long, Arlyss Repress, MD 05/13/21 [phone]

## 2021-05-12 NOTE — ED Triage Notes (Signed)
Pt presents with right arm pain and swelling after she received an IV infusion of 2 L of fluids on Monday for dehydration.

## 2021-05-12 NOTE — Discharge Instructions (Signed)
Apply warm compresses to your arm - 20 minutes three times daily.  Refer to the list below for adding potassium to your diet starting tomorrow as a puree.  Keep your appointment with your primary MD this week.

## 2021-05-12 NOTE — ED Provider Notes (Signed)
[facility]    CSN: [id number] Arrival date & time: 05/12/21  0831      History   Chief Complaint Chief Complaint  Patient presents with   Arm Pain    Rt arm    HPI Audrey [name] is a 35 y.o. female.   Presenting today with progressively worsening right arm pain, swelling after receiving an IV infusion of normal saline on Monday for dehydration.  She states the area where the IV was inserted and the antecubital space has been progressively more swollen, painful and now the pain is radiating into severe waves of cramping up into her right shoulder and now extending into chest with resultant chest tightness off and on.  Has also had some dizzy spells as the pain becomes worse.  Denies redness, fever, chills, palpitations, syncope.  Has been using heat and cold to the arm with no relief of symptoms.   Past Medical History:  Diagnosis Date   Allergy    Anxiety    BV (bacterial vaginosis) 12/30/2012   Chicken pox    Chronic kidney disease    Contraceptive management 07/15/2013   Hay fever    History of kidney stones    Hypertension    Irregular bleeding 12/30/2012   Leukorrhea 10/07/2013   resolved s/p cryotherapy     Migraines    PONV (postoperative nausea and vomiting)    Postcoital bleeding 05/13/2013   Had [place] and IUD in place, bleeding stopped after doxycycline but started back, has normal period lite x 2 days but bleeds every time with sex, is bright red and has clots and cramps after sex    UTI (lower urinary tract infection) 08/24/2014   UTI (urinary tract infection)     Patient Active Problem List   Diagnosis Date Noted   Hypertension 09/22/2020   Dry skin 08/10/2020   Weight gain 08/10/2020   Encounter for gynecological examination with Papanicolaou smear of cervix 08/10/2020   Irregular periods 08/10/2020   Routine medical exam 08/10/2020   Pregnancy examination or test, negative result 08/10/2020   Elevated BP without diagnosis of hypertension  08/10/2020   Menorrhagia with irregular cycle 05/05/2020   Bilateral leg edema 08/11/2019   Multifocal pneumonia 03/23/2019   Lymphocytopenia 03/23/2019   Lobar pneumonia (HCC) 03/23/2019   BMI 45.0-49.9, adult (HCC) 10/26/2018   Weight loss counseling, encounter for 10/26/2018   Irritable bowel syndrome with diarrhea 10/26/2018   Encounter for well woman exam with routine gynecological exam 12/09/2017   Family planning 12/09/2017   Screening examination for STD (sexually transmitted disease) 12/09/2017   Encounter for initial prescription of contraceptive pills 12/09/2017   SVD (spontaneous vaginal delivery) 01/01/2017   Pre-eclampsia 12/31/2016   Normal labor 12/31/2016   Preterm labor 12/04/2016   History of preterm delivery, currently pregnant 06/17/2016   History of cryosurgery of cervix affecting pregnancy 06/17/2016   BMI 40.0-44.9, adult (HCC) 06/15/2016   Pyelonephritis 06/15/2016   Attention deficit hyperactivity disorder (ADHD) 04/24/2015   BV (bacterial vaginosis) 12/30/2012   Anxiety 06/08/2012   Migraines 09/25/2010    Past Surgical History:  Procedure Laterality Date   CHOLECYSTECTOMY N/A 03/09/2014   Procedure: LAPAROSCOPIC CHOLECYSTECTOMY;  Surgeon: Dalia Heading, MD;  Location: [facility];  Service: General;  Laterality: N/A;   DILATION AND CURETTAGE OF UTERUS     TONSILLECTOMY      OB History     Gravida  5   Para  4   Term  3  Preterm  1   AB  1   Living  3      SAB  1   IAB      Ectopic      Multiple      Live Births  3            Home Medications    Prior to Admission medications   Medication Sig Start Date End Date Taking? Authorizing Provider  albuterol (VENTOLIN HFA) 108 (90 Base) MCG/ACT inhaler Inhale 2 puffs into the lungs every 6 (six) hours as needed for wheezing or shortness of breath. Patient not taking: Reported on 05/12/2021 02/14/21   Elson Areas, PA-C  Iron, Ferrous Sulfate, 325 (65 Fe) MG TABS Take 325 mg  by mouth daily. Patient not taking: Reported on 05/12/2021 02/02/21   Nelwyn Salisbury, MD  Multiple Vitamin (MULTIVITAMIN) tablet Take 1 tablet by mouth daily. Takes 2 gummies daily Patient not taking: Reported on 05/12/2021    [provider]    Family History Family History  Problem Relation Age of Onset   Arthritis Mother    Lung cancer Mother    Hyperlipidemia Mother    Atrial fibrillation Mother    Stroke Mother    Hypertension Mother    Diabetes Father    Dementia Father    Breast cancer Maternal Aunt    Diabetes Paternal Uncle     Social History Social History   Tobacco Use   Smoking status: Never   Smokeless tobacco: Never  Vaping Use   Vaping Use: Never used  Substance Use Topics   Alcohol use: No    Alcohol/week: 0.0 standard drinks   Drug use: No     Allergies   Bee venom, Codeine, Penicillins, Tape, and Bactrim [sulfamethoxazole-trimethoprim]   Review of Systems Review of Systems Per HPI  Physical Exam Triage Vital Signs ED Triage Vitals  Enc Vitals Group     BP 05/12/21 0855 103/77     Pulse Rate 05/12/21 0855 (!) 107     Resp 05/12/21 0855 16     Temp 05/12/21 0855 98.4 F (36.9 C)     Temp Source 05/12/21 0855 Oral     SpO2 05/12/21 0855 96 %     Weight --      Height --      Head Circumference --      Peak Flow --      Pain Score 05/12/21 0858 4     Pain Loc --      Pain Edu? --      Excl. in GC? --    No data found.  Updated Vital Signs BP 103/77 (BP Location: Left Arm)    Pulse 98    Temp 98.4 F (36.9 C) (Oral)    Resp 16    SpO2 96%   Visual Acuity Right Eye Distance:   Left Eye Distance:   Bilateral Distance:    Right Eye Near:   Left Eye Near:    Bilateral Near:     Physical Exam Vitals and nursing note reviewed.  Constitutional:      Appearance: Normal appearance. She is not ill-appearing.  HENT:     Head: Atraumatic.     Mouth/Throat:     Mouth: Mucous membranes are moist.  Eyes:     Extraocular  Movements: Extraocular movements intact.     Conjunctiva/sclera: Conjunctivae normal.  Cardiovascular:     Rate and Rhythm: Normal rate and regular rhythm.  Heart sounds: Normal heart sounds.  Pulmonary:     Effort: Pulmonary effort is normal.     Breath sounds: Normal breath sounds. No wheezing or rales.  Musculoskeletal:        General: Swelling and tenderness present. Normal range of motion.     Cervical back: Normal range of motion and neck supple.     Comments: Edema and tenderness to palpation right antecubital space at site of IV insertion.  Tenderness extending up into right upper arm medially and into pectoral muscles  Skin:    General: Skin is warm and dry.     Findings: No erythema.  Neurological:     Mental Status: She is alert and oriented to person, place, and time.     Sensory: No sensory deficit.     Motor: No weakness.     Gait: Gait normal.  Psychiatric:        Mood and Affect: Mood normal.        Thought Content: Thought content normal.        Judgment: Judgment normal.     UC Treatments / Results  Labs (all labs ordered are listed, but only abnormal results are displayed) Labs Reviewed - No data to display  EKG   Radiology No results found.  Procedures Procedures (including critical care time)  Medications Ordered in UC Medications - No data to display  Initial Impression / Assessment and Plan / UC Course  I have reviewed the triage vital signs and the nursing notes.  Pertinent labs & imaging results that were available during my care of the patient were reviewed by me and considered in my medical decision making (see chart for details).     Suspect ongoing thrombophlebitis from IV insertion, however given progressively worsening course of symptoms and now chest pain chest tightness associated do recommend that she go to the emergency department for further imaging and rule out of more life-threatening causes of her symptoms.  She is readily  agreeable and her husband wishes to take her via private vehicle to the emergency department.  She is currently hemodynamically stable for transport via private vehicle.  Final Clinical Impressions(s) / UC Diagnoses   Final diagnoses:  Right arm pain  Chest tightness  Thrombophlebitis   Discharge Instructions   None    ED Prescriptions   None    PDMP not reviewed this encounter.   Particia Nearing, [place] 05/12/21 1154

## 2021-05-14 ENCOUNTER — Ambulatory Visit: Payer: 59 | Admitting: Family Medicine

## 2021-05-15 ENCOUNTER — Ambulatory Visit (INDEPENDENT_AMBULATORY_CARE_PROVIDER_SITE_OTHER): Payer: 59 | Admitting: Family Medicine

## 2021-05-15 ENCOUNTER — Encounter: Payer: Self-pay | Admitting: Family Medicine

## 2021-05-15 VITALS — BP 106/80 | HR 99 | Temp 97.6°F | Wt 262.0 lb

## 2021-05-15 DIAGNOSIS — I808 Phlebitis and thrombophlebitis of other sites: Secondary | ICD-10-CM | POA: Diagnosis not present

## 2021-05-15 DIAGNOSIS — Z9884 Bariatric surgery status: Secondary | ICD-10-CM | POA: Insufficient documentation

## 2021-05-15 DIAGNOSIS — Z6841 Body Mass Index (BMI) 40.0 and over, adult: Secondary | ICD-10-CM | POA: Diagnosis not present

## 2021-05-15 DIAGNOSIS — I10 Essential (primary) hypertension: Secondary | ICD-10-CM

## 2021-05-15 NOTE — Progress Notes (Signed)
° °  Subjective:    Patient ID: Audrey [name], female    DOB: [birth year], 35 y.o.   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 The surgery went well, but she developed some mild dehydration. She had some IV fluids in the surgery office on 05-07-21, and her right arm became swollen and very painful over the next 5 days. She went to [facility] ER on 05-12-21 and had a doppler US on the right arm, right chest , and right neck. This was negative for any thrombi. She was told to simply apply warm compresses and wait, and sure enough this has totally resolved. Today she feels fine. She has lost 34 lbs from her peak weight in December 2021. She is eating only strained foods and liquids at this point. She is taking Prilosec, Pepto-Bismol, and TUMS as directed. No nausea or fever. BMs are normal.    Review of Systems  Constitutional: Negative.   Respiratory: Negative.    Cardiovascular: Negative.   Gastrointestinal: Negative.       Objective:   Physical Exam Constitutional:      Appearance: Normal appearance.  Cardiovascular:     Rate and Rhythm: Normal rate and regular rhythm.     Pulses: Normal pulses.     Heart sounds: Normal heart sounds.  Pulmonary:     Effort: Pulmonary effort is normal.     Breath sounds: Normal breath sounds.  Abdominal:     General: Abdomen is flat. Bowel sounds are normal. There is no distension.     Palpations: Abdomen is soft. There is no mass.     Tenderness: There is no abdominal tenderness. There is no guarding or rebound.     Hernia: No hernia is present.  Musculoskeletal:     Comments: The right arm is normal, no swelling or tenderness   Neurological:     Mental Status: She is alert.          Assessment & Plan:  She is recovering nicely from bariatric surgery. She had a bout of phlebitis at an IV site, but this has resolved. She will follow up with Dr. Adolphus Birchwood on  06-06-21. We spent a total of ( 34  ) minutes reviewing records and discussing these issues.  Gershon Crane, MD

## 2021-05-23 ENCOUNTER — Telehealth: Payer: Self-pay

## 2021-05-23 NOTE — Telephone Encounter (Signed)
Transition Care Management Unsuccessful Follow-up Telephone Call  Date of discharge and from where: 05/01/2021 from [facility]  Attempts:  1st Attempt  Reason for unsuccessful TCM follow-up call:  Left voice message  Kathyrn Sheriff, RN, MSN, BSN, CCM [facility] Care Management Coordinator [phone]

## 2021-05-25 ENCOUNTER — Telehealth: Payer: Self-pay

## 2021-05-25 NOTE — Telephone Encounter (Signed)
Transition Care Management Unsuccessful Follow-up Telephone Call  Date of discharge and from where:  05/01/2021   [facility]  Attempts:  2nd Attempt  Reason for unsuccessful TCM follow-up call:  No answer/busy Rowe Pavy, RN, BSN, CEN [facility] Coordinator [phone]

## 2021-07-10 ENCOUNTER — Other Ambulatory Visit: Payer: Self-pay

## 2021-07-10 ENCOUNTER — Encounter (HOSPITAL_COMMUNITY): Payer: Self-pay

## 2021-07-10 ENCOUNTER — Encounter (HOSPITAL_COMMUNITY)
Admission: EM | Disposition: A | Discharge status: Home / Self Care | Payer: Self-pay | Source: Home / Self Care | Attending: Emergency Medicine

## 2021-07-10 ENCOUNTER — Emergency Department (HOSPITAL_COMMUNITY): Payer: 59

## 2021-07-10 ENCOUNTER — Emergency Department (HOSPITAL_COMMUNITY): Payer: 59 | Admitting: Anesthesiology

## 2021-07-10 ENCOUNTER — Ambulatory Visit: Payer: Medicaid Other | Admitting: Family Medicine

## 2021-07-10 ENCOUNTER — Emergency Department (HOSPITAL_BASED_OUTPATIENT_CLINIC_OR_DEPARTMENT_OTHER): Payer: 59 | Admitting: Anesthesiology

## 2021-07-10 ENCOUNTER — Observation Stay (HOSPITAL_COMMUNITY)
Admission: EM | Admit: 2021-07-10 | Discharge: 2021-07-11 | Disposition: A | Discharge status: Home / Self Care | Payer: 59 | Attending: Urology | Admitting: Urology

## 2021-07-10 DIAGNOSIS — R102 Pelvic and perineal pain unspecified side: Secondary | ICD-10-CM

## 2021-07-10 DIAGNOSIS — I129 Hypertensive chronic kidney disease with stage 1 through stage 4 chronic kidney disease, or unspecified chronic kidney disease: Secondary | ICD-10-CM | POA: Insufficient documentation

## 2021-07-10 DIAGNOSIS — N200 Calculus of kidney: Secondary | ICD-10-CM

## 2021-07-10 DIAGNOSIS — F419 Anxiety disorder, unspecified: Secondary | ICD-10-CM

## 2021-07-10 DIAGNOSIS — N132 Hydronephrosis with renal and ureteral calculous obstruction: Secondary | ICD-10-CM | POA: Diagnosis not present

## 2021-07-10 DIAGNOSIS — N201 Calculus of ureter: Secondary | ICD-10-CM

## 2021-07-10 DIAGNOSIS — N10 Acute tubulo-interstitial nephritis: Secondary | ICD-10-CM

## 2021-07-10 DIAGNOSIS — N189 Chronic kidney disease, unspecified: Secondary | ICD-10-CM | POA: Insufficient documentation

## 2021-07-10 DIAGNOSIS — Z20822 Contact with and (suspected) exposure to covid-19: Secondary | ICD-10-CM | POA: Insufficient documentation

## 2021-07-10 DIAGNOSIS — E872 Acidosis, unspecified: Secondary | ICD-10-CM | POA: Diagnosis not present

## 2021-07-10 DIAGNOSIS — R509 Fever, unspecified: Secondary | ICD-10-CM

## 2021-07-10 HISTORY — PX: CYSTOSCOPY W/ URETERAL STENT PLACEMENT: SHX1429

## 2021-07-10 LAB — COMPREHENSIVE METABOLIC PANEL
ALT: 20 U/L (ref 0–44)
AST: 17 U/L (ref 15–41)
Albumin: 4.2 g/dL (ref 3.5–5.0)
Alkaline Phosphatase: 59 U/L (ref 38–126)
Anion gap: 14 (ref 5–15)
BUN: 13 mg/dL (ref 6–20)
CO2: 15 mmol/L — ABNORMAL LOW (ref 22–32)
Calcium: 8.9 mg/dL (ref 8.9–10.3)
Chloride: 107 mmol/L (ref 98–111)
Creatinine, Ser: 0.92 mg/dL (ref 0.44–1.00)
GFR, Estimated: >60 mL/min (ref >60)
Glucose, Bld: 121 mg/dL — ABNORMAL HIGH (ref 70–99)
Potassium: 3.3 mmol/L — ABNORMAL LOW (ref 3.5–5.1)
Sodium: 136 mmol/L (ref 135–145)
Total Bilirubin: 1 mg/dL (ref 0.3–1.2)
Total Protein: 6.9 g/dL (ref 6.5–8.1)

## 2021-07-10 LAB — CBC WITH DIFFERENTIAL/PLATELET
Abs Immature Granulocytes: 0.01 10*3/uL (ref 0.00–0.07)
Basophils Absolute: 0 10*3/uL (ref 0.0–0.1)
Basophils Relative: 0 %
Eosinophils Absolute: 0.1 10*3/uL (ref 0.0–0.5)
Eosinophils Relative: 1 %
HCT: 38.9 % (ref 36.0–46.0)
Hemoglobin: 12.5 g/dL (ref 12.0–15.0)
Immature Granulocytes: 0 %
Lymphocytes Relative: 24 %
Lymphs Abs: 1.2 10*3/uL (ref 0.7–4.0)
MCH: 26.8 pg (ref 26.0–34.0)
MCHC: 32.1 g/dL (ref 30.0–36.0)
MCV: 83.3 fL (ref 80.0–100.0)
Monocytes Absolute: 0.3 10*3/uL (ref 0.1–1.0)
Monocytes Relative: 6 %
Neutro Abs: 3.4 10*3/uL (ref 1.7–7.7)
Neutrophils Relative %: 69 %
Platelets: 317 10*3/uL (ref 150–400)
RBC: 4.67 MIL/uL (ref 3.87–5.11)
RDW: 14.8 % (ref 11.5–15.5)
WBC: 4.9 10*3/uL (ref 4.0–10.5)
nRBC: 0 % (ref 0.0–0.2)

## 2021-07-10 LAB — URINALYSIS, ROUTINE W REFLEX MICROSCOPIC
Bilirubin Urine: NEGATIVE
Glucose, UA: NEGATIVE mg/dL
Ketones, ur: 20 mg/dL — AB
Nitrite: POSITIVE — AB
Protein, ur: NEGATIVE mg/dL
RBC / HPF: >50 RBC/hpf — ABNORMAL HIGH (ref 0–5)
Specific Gravity, Urine: 1.035 — ABNORMAL HIGH (ref 1.005–1.030)
pH: 5 (ref 5.0–8.0)

## 2021-07-10 LAB — RESP PANEL BY RT-PCR (FLU A&B, COVID) ARPGX2
Influenza A by PCR: NEGATIVE
Influenza B by PCR: NEGATIVE
SARS Coronavirus 2 by RT PCR: NEGATIVE

## 2021-07-10 LAB — I-STAT BETA HCG BLOOD, ED (MC, WL, AP ONLY): I-stat hCG, quantitative: <5 m[IU]/mL (ref <5)

## 2021-07-10 LAB — LACTIC ACID, PLASMA
Lactic Acid, Venous: 2.6 mmol/L (ref 0.5–1.9)
Lactic Acid, Venous: 1.8 mmol/L (ref 0.5–1.9)

## 2021-07-10 LAB — LIPASE, BLOOD: Lipase: 27 U/L (ref 11–51)

## 2021-07-10 LAB — MAGNESIUM: Magnesium: 1.6 mg/dL — ABNORMAL LOW (ref 1.7–2.4)

## 2021-07-10 SURGERY — CYSTOSCOPY, WITH RETROGRADE PYELOGRAM AND URETERAL STENT INSERTION
Anesthesia: General | Site: Ureter | Laterality: Right

## 2021-07-10 MED ORDER — LIDOCAINE HCL (PF) 2 % IJ SOLN
INTRAMUSCULAR | Status: AC
  Filled 2021-07-10: qty 5

## 2021-07-10 MED ORDER — POTASSIUM CHLORIDE CRYS ER 20 MEQ PO TBCR: 40.0000 meq | EXTENDED_RELEASE_TABLET | Freq: Once | ORAL | Status: DC

## 2021-07-10 MED ORDER — SODIUM CHLORIDE 0.9 % IV SOLN
25.0000 mg | Freq: Once | INTRAVENOUS | Status: DC
  Filled 2021-07-10: qty 1

## 2021-07-10 MED ORDER — DIATRIZOATE MEGLUMINE 30 % UR SOLN
URETHRAL | Status: AC
  Filled 2021-07-10: qty 100

## 2021-07-10 MED ORDER — OXYBUTYNIN CHLORIDE 5 MG PO TABS
5.0000 mg | ORAL_TABLET | Freq: Three times a day (TID) | ORAL | Status: DC | PRN
  Administered 2021-07-11: 5 mg via ORAL
  Filled 2021-07-10: qty 1

## 2021-07-10 MED ORDER — FENTANYL CITRATE (PF) 100 MCG/2ML IJ SOLN
INTRAMUSCULAR | Status: DC | PRN
  Administered 2021-07-10: 100 ug via INTRAVENOUS

## 2021-07-10 MED ORDER — PROMETHAZINE HCL 25 MG/ML IJ SOLN
INTRAMUSCULAR | Status: AC
  Filled 2021-07-10: qty 1

## 2021-07-10 MED ORDER — ONDANSETRON HCL 4 MG/2ML IJ SOLN: 4.0000 mg | Freq: Once | INTRAMUSCULAR | Status: DC | PRN

## 2021-07-10 MED ORDER — SUCCINYLCHOLINE CHLORIDE 200 MG/10ML IV SOSY
PREFILLED_SYRINGE | INTRAVENOUS | Status: AC
  Filled 2021-07-10: qty 10

## 2021-07-10 MED ORDER — MAGNESIUM SULFATE 2 GM/50ML IV SOLN
2.0000 g | Freq: Once | INTRAVENOUS | Status: AC
  Administered 2021-07-10: 2 g via INTRAVENOUS
  Filled 2021-07-10: qty 50

## 2021-07-10 MED ORDER — ACETAMINOPHEN 325 MG PO TABS
650.0000 mg | ORAL_TABLET | ORAL | Status: DC | PRN
  Administered 2021-07-11: 650 mg via ORAL
  Filled 2021-07-10: qty 2

## 2021-07-10 MED ORDER — LACTATED RINGERS IV BOLUS
1000.0000 mL | Freq: Once | INTRAVENOUS | Status: AC
  Administered 2021-07-10: 10 mL via INTRAVENOUS

## 2021-07-10 MED ORDER — TAMSULOSIN HCL 0.4 MG PO CAPS
0.4000 mg | ORAL_CAPSULE | Freq: Once | ORAL | Status: AC
  Administered 2021-07-10: 0.4 mg via ORAL
  Filled 2021-07-10: qty 1

## 2021-07-10 MED ORDER — SODIUM CHLORIDE 0.9 % IV SOLN
2.0000 g | Freq: Once | INTRAVENOUS | Status: AC
  Administered 2021-07-10: 2 g via INTRAVENOUS
  Filled 2021-07-10: qty 20

## 2021-07-10 MED ORDER — SCOPOLAMINE 1 MG/3DAYS TD PT72
MEDICATED_PATCH | TRANSDERMAL | Status: DC | PRN
  Administered 2021-07-10: 3 mg via TRANSDERMAL

## 2021-07-10 MED ORDER — FENTANYL CITRATE PF 50 MCG/ML IJ SOSY: 25.0000 ug | PREFILLED_SYRINGE | INTRAMUSCULAR | Status: DC | PRN

## 2021-07-10 MED ORDER — HYDROMORPHONE HCL 1 MG/ML IJ SOLN
1.0000 mg | Freq: Once | INTRAMUSCULAR | Status: AC
  Administered 2021-07-10: 1 mg via INTRAVENOUS
  Filled 2021-07-10: qty 1

## 2021-07-10 MED ORDER — DIPHENHYDRAMINE HCL 12.5 MG/5ML PO ELIX: 12.5000 mg | ORAL_SOLUTION | Freq: Four times a day (QID) | ORAL | Status: DC | PRN

## 2021-07-10 MED ORDER — MIDAZOLAM HCL 2 MG/2ML IJ SOLN
INTRAMUSCULAR | Status: AC
  Filled 2021-07-10: qty 2

## 2021-07-10 MED ORDER — HYDROMORPHONE HCL 1 MG/ML IJ SOLN
0.5000 mg | INTRAMUSCULAR | Status: DC | PRN
  Administered 2021-07-10 – 2021-07-11 (×2): 0.5 mg via INTRAVENOUS
  Filled 2021-07-10 (×2): qty 0.5

## 2021-07-10 MED ORDER — LACTATED RINGERS IV SOLN: INTRAVENOUS | Status: DC

## 2021-07-10 MED ORDER — ONDANSETRON HCL 4 MG/2ML IJ SOLN
INTRAMUSCULAR | Status: DC | PRN
Start: 2021-07-10 — End: 2021-07-10
  Administered 2021-07-10: 4 mg via INTRAVENOUS

## 2021-07-10 MED ORDER — CHLORHEXIDINE GLUCONATE 0.12 % MT SOLN
OROMUCOSAL | Status: AC
  Filled 2021-07-10: qty 15

## 2021-07-10 MED ORDER — CHLORHEXIDINE GLUCONATE 0.12 % MT SOLN
15.0000 mL | Freq: Once | OROMUCOSAL | Status: AC
  Administered 2021-07-10: 15 mL via OROMUCOSAL

## 2021-07-10 MED ORDER — ROCURONIUM 10MG/ML (10ML) SYRINGE FOR MEDFUSION PUMP - OPTIME
INTRAVENOUS | Status: DC | PRN
  Administered 2021-07-10: 10 mg via INTRAVENOUS

## 2021-07-10 MED ORDER — MIDAZOLAM HCL 5 MG/5ML IJ SOLN
INTRAMUSCULAR | Status: DC | PRN
  Administered 2021-07-10: 2 mg via INTRAVENOUS

## 2021-07-10 MED ORDER — DEXAMETHASONE SODIUM PHOSPHATE 10 MG/ML IJ SOLN
INTRAMUSCULAR | Status: DC | PRN
  Administered 2021-07-10: 8 mg via INTRAVENOUS

## 2021-07-10 MED ORDER — DIATRIZOATE MEGLUMINE 30 % UR SOLN
URETHRAL | Status: DC | PRN
  Administered 2021-07-10: 4 mL via URETHRAL

## 2021-07-10 MED ORDER — IOHEXOL 300 MG/ML  SOLN
100.0000 mL | Freq: Once | INTRAMUSCULAR | Status: AC | PRN
  Administered 2021-07-10: 100 mL via INTRAVENOUS

## 2021-07-10 MED ORDER — SUGAMMADEX SODIUM 200 MG/2ML IV SOLN
INTRAVENOUS | Status: DC | PRN
  Administered 2021-07-10: 200 mg via INTRAVENOUS

## 2021-07-10 MED ORDER — ONDANSETRON HCL 4 MG/2ML IJ SOLN
INTRAMUSCULAR | Status: AC
  Filled 2021-07-10: qty 2

## 2021-07-10 MED ORDER — STERILE WATER FOR IRRIGATION IR SOLN
Status: DC | PRN
  Administered 2021-07-10: 500 mL

## 2021-07-10 MED ORDER — SUCCINYLCHOLINE 20MG/ML (10ML) SYRINGE FOR MEDFUSION PUMP - OPTIME
INTRAMUSCULAR | Status: DC | PRN
  Administered 2021-07-10: 120 mg via INTRAVENOUS

## 2021-07-10 MED ORDER — ONDANSETRON HCL 4 MG/2ML IJ SOLN: 4.0000 mg | INTRAMUSCULAR | Status: DC | PRN

## 2021-07-10 MED ORDER — DIPHENHYDRAMINE HCL 50 MG/ML IJ SOLN: 12.5000 mg | Freq: Four times a day (QID) | INTRAMUSCULAR | Status: DC | PRN

## 2021-07-10 MED ORDER — FENTANYL CITRATE (PF) 250 MCG/5ML IJ SOLN
INTRAMUSCULAR | Status: AC
  Filled 2021-07-10: qty 5

## 2021-07-10 MED ORDER — DEXAMETHASONE SODIUM PHOSPHATE 10 MG/ML IJ SOLN
INTRAMUSCULAR | Status: AC
  Filled 2021-07-10: qty 1

## 2021-07-10 MED ORDER — SCOPOLAMINE 1 MG/3DAYS TD PT72
MEDICATED_PATCH | TRANSDERMAL | Status: AC
  Filled 2021-07-10: qty 1

## 2021-07-10 MED ORDER — ONDANSETRON HCL 4 MG/2ML IJ SOLN
4.0000 mg | Freq: Once | INTRAMUSCULAR | Status: AC
  Administered 2021-07-10: 4 mg via INTRAVENOUS
  Filled 2021-07-10: qty 2

## 2021-07-10 MED ORDER — METOCLOPRAMIDE HCL 5 MG/ML IJ SOLN
10.0000 mg | Freq: Once | INTRAMUSCULAR | Status: AC
  Administered 2021-07-10: 10 mg via INTRAVENOUS
  Filled 2021-07-10: qty 2

## 2021-07-10 MED ORDER — OXYCODONE-ACETAMINOPHEN 5-325 MG PO TABS
1.0000 | ORAL_TABLET | ORAL | Status: DC | PRN
  Filled 2021-07-10: qty 1

## 2021-07-10 MED ORDER — SODIUM CHLORIDE 0.9 % IR SOLN
Status: DC | PRN
  Administered 2021-07-10: 3000 mL

## 2021-07-10 MED ORDER — LACTATED RINGERS IV BOLUS
1000.0000 mL | Freq: Once | INTRAVENOUS | Status: AC
  Administered 2021-07-10: 1000 mL via INTRAVENOUS

## 2021-07-10 MED ORDER — LIDOCAINE HCL (CARDIAC) PF 50 MG/5ML IV SOSY
PREFILLED_SYRINGE | INTRAVENOUS | Status: DC | PRN
  Administered 2021-07-10: 80 mg via INTRAVENOUS

## 2021-07-10 MED ORDER — PROPOFOL 10 MG/ML IV BOLUS
INTRAVENOUS | Status: AC
  Filled 2021-07-10: qty 20

## 2021-07-10 MED ORDER — PROPOFOL 10 MG/ML IV BOLUS
INTRAVENOUS | Status: DC | PRN
Start: 2021-07-10 — End: 2021-07-10
  Administered 2021-07-10: 180 mg via INTRAVENOUS

## 2021-07-10 MED ORDER — SODIUM CHLORIDE 0.9 % IV SOLN
2.0000 g | INTRAVENOUS | Status: DC
  Administered 2021-07-11: 2 g via INTRAVENOUS
  Filled 2021-07-10: qty 20

## 2021-07-10 MED ORDER — ZOLPIDEM TARTRATE 5 MG PO TABS: 5.0000 mg | ORAL_TABLET | Freq: Every evening | ORAL | Status: DC | PRN

## 2021-07-10 MED ORDER — ROCURONIUM BROMIDE 10 MG/ML (PF) SYRINGE
PREFILLED_SYRINGE | INTRAVENOUS | Status: AC
  Filled 2021-07-10: qty 10

## 2021-07-10 MED ORDER — ORAL CARE MOUTH RINSE: 15.0000 mL | Freq: Once | OROMUCOSAL | Status: AC

## 2021-07-10 MED ORDER — PROMETHAZINE HCL 25 MG/ML IJ SOLN
12.5000 mg | Freq: Once | INTRAMUSCULAR | Status: AC
  Administered 2021-07-10: 12.5 mg via INTRAVENOUS

## 2021-07-10 MED ORDER — ACETAMINOPHEN 325 MG PO TABS
650.0000 mg | ORAL_TABLET | Freq: Once | ORAL | Status: AC
  Administered 2021-07-10: 650 mg via ORAL
  Filled 2021-07-10: qty 2

## 2021-07-10 SURGICAL SUPPLY — 22 items
BAG DRAIN URO TABLE W/ADPT NS (BAG) ×3 IMPLANT
BAG DRN 8 ADPR NS SKTRN CSTL (BAG) ×1
BAG HAMPER (MISCELLANEOUS) ×3 IMPLANT
CATH INTERMIT  6FR 70CM (CATHETERS) ×3 IMPLANT
CLOTH BEACON ORANGE TIMEOUT ST (SAFETY) ×3 IMPLANT
EXTRACTOR STONE NITINOL NGAGE (UROLOGICAL SUPPLIES) ×2 IMPLANT
GLOVE SURG POLYISO LF SZ8 (GLOVE) ×3 IMPLANT
GLOVE SURG UNDER POLY LF SZ7 (GLOVE) ×6 IMPLANT
GOWN STRL REUS W/TWL LRG LVL3 (GOWN DISPOSABLE) ×4 IMPLANT
GOWN STRL REUS W/TWL XL LVL3 (GOWN DISPOSABLE) ×3 IMPLANT
GUIDEWIRE STR ZIPWIRE 035X150 (MISCELLANEOUS) ×3 IMPLANT
IV NS IRRIG 3000ML ARTHROMATIC (IV SOLUTION) ×3 IMPLANT
KIT TURNOVER CYSTO (KITS) ×3 IMPLANT
MANIFOLD NEPTUNE II (INSTRUMENTS) ×3 IMPLANT
PACK CYSTO (CUSTOM PROCEDURE TRAY) ×3 IMPLANT
PAD ARMBOARD 7.5X6 YLW CONV (MISCELLANEOUS) ×3 IMPLANT
STENT URET 6FRX26 CONTOUR (STENTS) ×1 IMPLANT
SYR 10ML LL (SYRINGE) ×3 IMPLANT
TOWEL OR 17X26 4PK STRL BLUE (TOWEL DISPOSABLE) ×3 IMPLANT
TRAY FOL W/BAG SLVR 16FR STRL (SET/KITS/TRAYS/PACK) IMPLANT
TRAY FOLEY W/BAG SLVR 16FR LF (SET/KITS/TRAYS/PACK) ×2
WATER STERILE IRR 500ML POUR (IV SOLUTION) ×3 IMPLANT

## 2021-07-10 NOTE — ED Triage Notes (Signed)
Pt arrived with complaints of right sided abdominal pain that radiates around the back. Pt states pain is worse than labor pains. Pt stated she is losing feeling in bilateral lower extremities. Hands are noted to have contracted hands bilateral. Pt states she had gastric sleeve surgery 04/30/21.

## 2021-07-10 NOTE — Anesthesia Preprocedure Evaluation (Signed)
Anesthesia Evaluation  Patient identified by MRN, date of birth, ID band Patient awake    Reviewed: Allergy & Precautions, H&P , NPO status , Patient's Chart, lab work & pertinent test results, reviewed documented beta blocker date and time   History of Anesthesia Complications (+) PONV and history of anesthetic complications  Airway Mallampati: II  TM Distance: >3 FB Neck ROM: full    Dental no notable dental hx.    Pulmonary neg pulmonary ROS,    Pulmonary exam normal breath sounds clear to auscultation       Cardiovascular Exercise Tolerance: Good hypertension, negative cardio ROS   Rhythm:regular Rate:Normal     Neuro/Psych  Headaches, PSYCHIATRIC DISORDERS Anxiety    GI/Hepatic negative GI ROS, Neg liver ROS,   Endo/Other  Morbid obesity  Renal/GU CRFRenal disease  negative genitourinary   Musculoskeletal   Abdominal   Peds  Hematology negative hematology ROS (+)   Anesthesia Other Findings   Reproductive/Obstetrics negative OB ROS                             Anesthesia Physical Anesthesia Plan  ASA: 3 and emergent  Anesthesia Plan: General and General ETT   Post-op Pain Management:    Induction:   PONV Risk Score and Plan:   Airway Management Planned:   Additional Equipment:   Intra-op Plan:   Post-operative Plan:   Informed Consent: I have reviewed the patients History and Physical, chart, labs and discussed the procedure including the risks, benefits and alternatives for the proposed anesthesia with the patient or authorized representative who has indicated his/her understanding and acceptance.     Dental Advisory Given  Plan Discussed with: CRNA  Anesthesia Plan Comments:         Anesthesia Quick Evaluation

## 2021-07-10 NOTE — ED Provider Notes (Signed)
[facility] EMERGENCY DEPARTMENT Provider Note   CSN: [id number] Arrival date & time: 07/10/21  [id number]     History  Chief Complaint  Patient presents with   Abdominal Pain    Audrey [name] is a 36 y.o. female.   Abdominal Pain Associated symptoms: nausea   Patient presents for abdominal pain.  Medical history is notable for migraines, anxiety, ADHD, HTN.  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  She reports urinary frequency 2 days ago.  She did not have any associated dysuria.  She did take over-the-counter Azo and frequency resolved yesterday.  This morning, she awoke with severe pain in the area of her right lower quadrant radiating around her right flank into her right lower back.  Pain is severe in intensity.  She has had associated nausea.  She has not had any vomiting.  Her LMP was 3 weeks ago.  Last bowel movement was 2 days ago.  Since her bariatric surgery, she typically has bowel movements every other day.  Although she has been experiencing nausea, she has not had any vomiting this morning.  Currently she has mild nausea and severe pain.  She has a remote history of nephrolithiasis and pyelonephritis.  These were in the setting of pregnancy 16 years ago.    Home Medications Prior to Admission medications   Medication Sig Start Date End Date Taking? Authorizing Provider  Calcium Citrate 1040 MG TABS Take 1 tablet by mouth daily.   Yes [provider]  cefUROXime (CEFTIN) 500 MG tablet Take 1 tablet (500 mg total) by mouth 2 (two) times daily with a meal. 07/11/21  Yes McKenzie, Candee Furbish, MD  Cholecalciferol 25 MCG (1000 UT) tablet Take 1 tablet by mouth daily.   Yes [provider]  Iron, Ferrous Sulfate, 325 (65 Fe) MG TABS Take 325 mg by mouth daily. 02/02/21  Yes Laurey Morale, MD  Multiple Vitamin (MULTIVITAMIN) tablet Take 1 tablet by mouth daily. Takes 2 gummies daily   Yes [provider]   oxybutynin (DITROPAN) 5 MG tablet Take 1 tablet (5 mg total) by mouth 3 (three) times daily. 07/11/21  Yes McKenzie, Candee Furbish, MD  oxyCODONE-acetaminophen (PERCOCET) 5-325 MG tablet Take 1 tablet by mouth every 4 (four) hours as needed for severe pain. 07/11/21 07/11/22 Yes McKenzie, Candee Furbish, MD  tamsulosin (FLOMAX) 0.4 MG CAPS capsule Take 1 capsule (0.4 mg total) by mouth daily after supper. 07/11/21  Yes McKenzie, Candee Furbish, MD      Allergies    Bee venom, Codeine, Penicillins, Percocet [oxycodone-acetaminophen], Tape, and Bactrim [sulfamethoxazole-trimethoprim]    Review of Systems   Review of Systems  Gastrointestinal:  Positive for abdominal pain and nausea.  Genitourinary:  Positive for flank pain and frequency.  Musculoskeletal:  Positive for back pain.  All other systems reviewed and are negative.  Physical Exam Updated Vital Signs BP 97/68 (BP Location: Right Arm)    Pulse (!) 57    Temp (!) 97.4 F (36.3 C) (Oral)    Resp 19    Ht 5\' 4"  (1.626 m)    Wt 110.2 kg    LMP 06/14/2021 Comment: Beta HCg negative 07/10/2021   SpO2 98%    BMI 41.71 kg/m  Physical Exam Vitals and nursing note reviewed.  Constitutional:      General: She is not in acute distress.    Appearance: She is well-developed. She is ill-appearing. She is not toxic-appearing.  HENT:  Head: Normocephalic and atraumatic.     Mouth/Throat:     Mouth: Mucous membranes are moist.     Pharynx: Oropharynx is clear.  Eyes:     Conjunctiva/sclera: Conjunctivae normal.  Cardiovascular:     Rate and Rhythm: Normal rate and regular rhythm.     Heart sounds: No murmur heard. Pulmonary:     Effort: Pulmonary effort is normal. No respiratory distress.  Abdominal:     Palpations: Abdomen is soft.     Tenderness: There is abdominal tenderness in the right lower quadrant. There is right CVA tenderness. There is no left CVA tenderness, guarding or rebound.  Musculoskeletal:        General: No swelling.     Cervical  back: Neck supple.  Skin:    General: Skin is warm and dry.     Capillary Refill: Capillary refill takes less than 2 seconds.     Coloration: Skin is not jaundiced or pale.  Neurological:     General: No focal deficit present.     Mental Status: She is alert and oriented to person, place, and time.  Psychiatric:        Mood and Affect: Mood normal.        Behavior: Behavior normal.    ED Results / Procedures / Treatments   Labs (all labs ordered are listed, but only abnormal results are displayed) Labs Reviewed  COMPREHENSIVE METABOLIC PANEL - Abnormal; Notable for the following components:      Result Value   Potassium 3.3 (*)    CO2 15 (*)    Glucose, Bld 121 (*)    All other components within normal limits  URINALYSIS, ROUTINE W REFLEX MICROSCOPIC - Abnormal; Notable for the following components:   APPearance HAZY (*)    Specific Gravity, Urine 1.035 (*)    Hgb urine dipstick MODERATE (*)    Ketones, ur 20 (*)    Nitrite POSITIVE (*)    Leukocytes,Ua TRACE (*)    RBC / HPF >50 (*)    Bacteria, UA RARE (*)    All other components within normal limits  MAGNESIUM - Abnormal; Notable for the following components:   Magnesium 1.6 (*)    All other components within normal limits  LACTIC ACID, PLASMA - Abnormal; Notable for the following components:   Lactic Acid, Venous 2.6 (*)    All other components within normal limits  CBC - Abnormal; Notable for the following components:   WBC 10.9 (*)    Hemoglobin 10.9 (*)    HCT 35.8 (*)    All other components within normal limits  BASIC METABOLIC PANEL - Abnormal; Notable for the following components:   Glucose, Bld 119 (*)    Calcium 8.7 (*)    All other components within normal limits  RESP PANEL BY RT-PCR (FLU A&B, COVID) ARPGX2  LIPASE, BLOOD  CBC WITH DIFFERENTIAL/PLATELET  LACTIC ACID, PLASMA  CALCULI, WITH PHOTOGRAPH (CLINICAL LAB)  I-STAT BETA HCG BLOOD, ED ([facility], [facility], [facility] ONLY)    EKG None  Radiology DG C-Arm  1-60 Min-No Report  Result Date: 07/10/2021 Fluoroscopy was utilized by the requesting physician.  No radiographic interpretation.    Procedures Procedures    Medications Ordered in ED Medications  chlorhexidine (PERIDEX) 0.12 % solution (has no administration in time range)  HYDROmorphone (DILAUDID) injection 1 mg (1 mg Intravenous Given 07/10/21 0848)  ondansetron (ZOFRAN) injection 4 mg (4 mg Intravenous Given 07/10/21 0847)  lactated ringers bolus 1,000 mL (0  mLs Intravenous Stopped 07/10/21 1003)  magnesium sulfate IVPB 2 g 50 mL (0 g Intravenous Stopped 07/10/21 1142)  HYDROmorphone (DILAUDID) injection 1 mg (1 mg Intravenous Given 07/10/21 1023)  metoCLOPramide (REGLAN) injection 10 mg (10 mg Intravenous Given 07/10/21 1019)  iohexol (OMNIPAQUE) 300 MG/ML solution 100 mL (100 mLs Intravenous Contrast Given 07/10/21 1051)  acetaminophen (TYLENOL) tablet 650 mg (650 mg Oral Given 07/10/21 1143)  lactated ringers bolus 1,000 mL ( Intravenous MAR Unhold 07/10/21 1502)  cefTRIAXone (ROCEPHIN) 2 g in sodium chloride 0.9 % 100 mL IVPB (0 g Intravenous Stopped 07/10/21 1250)  tamsulosin (FLOMAX) capsule 0.4 mg (0.4 mg Oral Given 07/10/21 1143)  chlorhexidine (PERIDEX) 0.12 % solution 15 mL (15 mLs Mouth/Throat Given 07/10/21 1315)    Or  MEDLINE mouth rinse ( Mouth Rinse See Alternative 07/10/21 1315)  scopolamine (TRANSDERM-SCOP) 1 MG/3DAYS (  Override pull for Anesthesia 07/10/21 1338)  promethazine (PHENERGAN) injection 12.5 mg (12.5 mg Intravenous Given 07/10/21 1421)    ED Course/ Medical Decision Making/ A&P                           Medical Decision Making Amount and/or Complexity of Data Reviewed Labs: ordered. Radiology: ordered. ECG/medicine tests: ordered.  Risk OTC drugs. Prescription drug management.   This patient presents to the ED for concern of right lower abdominal pain radiating to right flank and back, this involves an extensive number of treatment options, and is a  complaint that carries with it a high risk of complications and morbidity.  The differential diagnosis includes pyelonephritis, nephrolithiasis, appendicitis, small bowel obstruction, ovarian torsion   Co morbidities that complicate the patient evaluation  migraines, anxiety, ADHD, HTN.  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  The pertinent results include: Metabolic acidosis, lactic acidosis, hypomagnesemia, UTI   Imaging Studies ordered:  I ordered imaging studies including pelvic ultrasound, CT of abdomen and pelvis I independently visualized and interpreted imaging which showed right-sided UVJ stone with upstream hydronephrosis and hydroureter I agree with the radiologist interpretation   Cardiac Monitoring:  The patient was maintained on a cardiac monitor.  I personally viewed and interpreted the cardiac monitored which showed an underlying rhythm of: Sinus rhythm   Medicines ordered and prescription drug management:  I ordered medication including IVF, Zofran, Dilaudid, Reglan for symptomatic relief; magnesium sulfate for hypomagnesemia; additional IV fluids, ceftriaxone, and tamsulosin for treatment of infected stone; Tylenol for antipyresis Reevaluation of the patient after these medicines showed that the patient improved I have reviewed the patients home medicines and have made adjustments as needed  Critical Interventions:  Identification obstructive uropathy with infection present, initiation of antibiotics, consultation with urology   Consultations Obtained:  I requested consultation with the urologist,  and discussed lab and imaging findings as well as pertinent plan - they recommend: Operative  management   Problem List / ED Course:  Healthy 36 year old female presenting for the acute onset of right lower abdominal pain radiating to right flank and back this morning.  Symptoms were present upon waking up.  Pain has been severe.  She has had associated nausea.  On arrival in the ED, she does appear quite uncomfortable.  Blood pressure and heart rate are normal at  this time.  On exam, she does have tenderness without guarding in the areas of right lower quadrant, right flank, and right CVA.  Dilaudid and Zofran given for analgesia.  Patient undergo laboratory work-up.  Due to concern of adnexal etiology, pelvic ultrasound was ordered.  CT scan of abdomen pelvis ordered as well.  Patient had persistent pain and additional Dilaudid was ordered.  Reglan was given for persistent nausea.  She underwent pelvic ultrasound which showed normal findings in the right adnexa.  On return to the ED, she did have a low-grade fever.  She underwent a CT scan of abdomen pelvis which did show a right-sided obstructive UVJ stone with upstream hydronephrosis and hydroureter.  Urinalysis showed presence of infection.  Patient was given additional IV fluids and ceftriaxone.  Urology was consulted.  Urology evaluated the patient in the ED and took her to the OR for operative management.   Reevaluation:  After the interventions noted above, I reevaluated the patient and found that they have :improved   Social Determinants of Health:  No negative social determinants of health identified   Dispostion:  After consideration of the diagnostic results and the patients response to treatment, I feel that the patent would benefit from operative management.   CRITICAL CARE Performed by: Godfrey Pick   Total critical care time: 32 minutes  Critical care time was exclusive of separately billable procedures and treating other patients.  Critical care was necessary to treat or prevent imminent or life-threatening  deterioration.  Critical care was time spent personally by me on the following activities: development of treatment plan with patient and/or surrogate as well as nursing, discussions with consultants, evaluation of patient's response to treatment, examination of patient, obtaining history from patient or surrogate, ordering and performing treatments and interventions, ordering and review of laboratory studies, ordering and review of radiographic studies, pulse oximetry and re-evaluation of patient's condition.          Final Clinical Impression(s) / ED Diagnoses Final diagnoses:  Pelvic pain  Hydronephrosis concurrent with and due to calculi of kidney and ureter  Acute pyelonephritis  Lactic acidosis    Rx / DC Orders ED Discharge Orders          Ordered    oxyCODONE-acetaminophen (PERCOCET) 5-325 MG tablet  Every 4 hours PRN        07/11/21 1533    tamsulosin (FLOMAX) 0.4 MG CAPS capsule  Daily after supper        07/11/21 1533    cefUROXime (CEFTIN) 500 MG tablet  2 times daily with meals        07/11/21 1533    oxybutynin (DITROPAN) 5 MG tablet  3 times daily        07/11/21 1533    Discharge patient        07/11/21 1533              Godfrey Pick, MD 07/12/21 1123

## 2021-07-10 NOTE — ED Notes (Addendum)
Istat beta hcg <5.0....Marland Kitchenresults not crossing over at this time.

## 2021-07-10 NOTE — Consult Note (Addendum)
Urology Consult  Referring physician: Dr Durwin Noraixon Reason for referral: Right ureteral calculus, fever  Chief Complaint: right flank pain  History of Present Illness: [name] is a 35yo who presented to the ER with a 2 day history of right flank pain. The pain is sharp, constant, moderate to severe and nonraditing. She has associated nausea and vomiting. UA is concerning for infection. She has been taking AZO for 2 days. She has a fever of 100.6. This is her second stone event. Her last stone event was 16 years ago. No other associated symptoms. No exacerbating/alleviaitng events. Ct shows a 4mm right distal ureteral calculus with moderate right hydronephrosis.   Past Medical History:  Diagnosis Date   Allergy    Anxiety    BV (bacterial vaginosis) 12/30/2012   Chicken pox    Chronic kidney disease    Contraceptive management 07/15/2013   Hay fever    History of kidney stones    Hypertension    Irregular bleeding 12/30/2012   Leukorrhea 10/07/2013   resolved s/p cryotherapy     Migraines    PONV (postoperative nausea and vomiting)    Postcoital bleeding 05/13/2013   Had US and IUD in place, bleeding stopped after doxycycline but started back, has normal period lite x 2 days but bleeds every time with sex, is bright red and has clots and cramps after sex    UTI (lower urinary tract infection) 08/24/2014   UTI (urinary tract infection)    Past Surgical History:  Procedure Laterality Date   CHOLECYSTECTOMY N/A 03/09/2014   Procedure: LAPAROSCOPIC CHOLECYSTECTOMY;  Surgeon: Dalia HeadingMark A Jenkins, MD;  Location: [facility];  Service: General;  Laterality: N/A;   DILATION AND CURETTAGE OF UTERUS     LAPAROSCOPIC GASTRIC SLEEVE RESECTION     TONSILLECTOMY      Medications: I have reviewed the patient's current medications. Allergies:  Allergies  Allergen Reactions   Bee Venom Shortness Of Breath   Codeine Itching   Penicillins Hives    Has patient had a PCN reaction causing immediate rash,  facial/tongue/throat swelling, SOB or lightheadedness with hypotension: no Has patient had a PCN reaction causing severe rash involving mucus membranes or skin necrosis: no Has patient had a PCN reaction that required hospitalization no Has patient had a PCN reaction occurring within the last 10 years: no If all of the above answers are "NO", then may proceed with Cephalosporin use.    Tape Hives   Bactrim [Sulfamethoxazole-Trimethoprim] Itching and Rash    Family History  Problem Relation Age of Onset   Arthritis Mother    Lung cancer Mother    Hyperlipidemia Mother    Atrial fibrillation Mother    Stroke Mother    Hypertension Mother    Diabetes Father    Dementia Father    Breast cancer Maternal Aunt    Diabetes Paternal Uncle    Social History:  reports that she has never smoked. She has never used smokeless tobacco. She reports that she does not drink alcohol and does not use drugs.  Review of Systems  Genitourinary:  Positive for dysuria, flank pain, frequency and urgency.  All other systems reviewed and are negative.  Physical Exam:  Vital signs in last 24 hours: Temp:  [99.1 F (37.3 C)-100.6 F (38.1 C)] 100.6 F (38.1 C) (02/14 1001) Pulse Rate:  [87-121] 118 (02/14 1130) Resp:  [19-33] 19 (02/14 1130) BP: (101-137)/(55-92) 101/62 (02/14 1130) SpO2:  [93 %-100 %] 93 % (02/14 1130) Weight:  [  110.2 kg] 110.2 kg (02/14 0824) Physical Exam Vitals reviewed.  Constitutional:      Appearance: She is well-developed.  HENT:     Head: Normocephalic and atraumatic.  Eyes:     Extraocular Movements: Extraocular movements intact.     Pupils: Pupils are equal, round, and reactive to light.  Cardiovascular:     Rate and Rhythm: Normal rate and regular rhythm.  Pulmonary:     Effort: Pulmonary effort is normal. No respiratory distress.  Abdominal:     General: Abdomen is flat. There is no distension.  Skin:    General: Skin is warm and dry.  Neurological:      General: No focal deficit present.     Mental Status: She is alert and oriented to person, place, and time.    Laboratory Data:  Results for orders placed or performed during the hospital encounter of 07/10/21 (from the past 72 hour(s))  Comprehensive metabolic panel     Status: Abnormal   Collection Time: 07/10/21  8:34 AM  Result Value Ref Range   Sodium 136 135 - 145 mmol/L   Potassium 3.3 (L) 3.5 - 5.1 mmol/L   Chloride 107 98 - 111 mmol/L   CO2 15 (L) 22 - 32 mmol/L   Glucose, Bld 121 (H) 70 - 99 mg/dL    Comment: Glucose reference range applies only to samples taken after fasting for at least 8 hours.   BUN 13 6 - 20 mg/dL   Creatinine, Ser 1.09 0.44 - 1.00 mg/dL   Calcium 8.9 8.9 - 60.4 mg/dL   Total Protein 6.9 6.5 - 8.1 g/dL   Albumin 4.2 3.5 - 5.0 g/dL   AST 17 15 - 41 U/L   ALT 20 0 - 44 U/L   Alkaline Phosphatase 59 38 - 126 U/L   Total Bilirubin 1.0 0.3 - 1.2 mg/dL   GFR, Estimated >54 >09 mL/min    Comment: (NOTE) Calculated using the CKD-EPI Creatinine Equation (2021)    Anion gap 14 5 - 15    Comment: Performed at [facility], [address], [place], [place] [address]  Lipase, blood     Status: None   Collection Time: 07/10/21  8:34 AM  Result Value Ref Range   Lipase 27 11 - 51 U/L    Comment: Performed at [facility], [address], [place], [place] [address]  CBC with Diff     Status: None   Collection Time: 07/10/21  8:34 AM  Result Value Ref Range   WBC 4.9 4.0 - 10.5 K/uL   RBC 4.67 3.87 - 5.11 MIL/uL   Hemoglobin 12.5 12.0 - 15.0 g/dL   HCT 56.2 13.0 - 86.5 %   MCV 83.3 80.0 - 100.0 fL   MCH 26.8 26.0 - 34.0 pg   MCHC 32.1 30.0 - 36.0 g/dL   RDW 78.4 69.6 - 29.5 %   Platelets 317 150 - 400 K/uL   nRBC 0.0 0.0 - 0.2 %   Neutrophils Relative % 69 %   Neutro Abs 3.4 1.7 - 7.7 K/uL   Lymphocytes Relative 24 %   Lymphs Abs 1.2 0.7 - 4.0 K/uL   Monocytes Relative 6 %   Monocytes Absolute 0.3 0.1 - 1.0 K/uL   Eosinophils Relative 1 %    Eosinophils Absolute 0.1 0.0 - 0.5 K/uL   Basophils Relative 0 %   Basophils Absolute 0.0 0.0 - 0.1 K/uL   Immature Granulocytes 0 %   Abs Immature Granulocytes  0.01 0.00 - 0.07 K/uL    Comment: Performed at [facility], [address] [place], [place] [address]  Urinalysis, Routine w reflex microscopic Urine, Clean Catch     Status: Abnormal   Collection Time: 07/10/21  8:34 AM  Result Value Ref Range   Color, Urine YELLOW YELLOW   APPearance HAZY (A) CLEAR   Specific Gravity, Urine 1.035 (H) 1.005 - 1.030   pH 5.0 5.0 - 8.0   Glucose, UA NEGATIVE NEGATIVE mg/dL   Hgb urine dipstick MODERATE (A) NEGATIVE   Bilirubin Urine NEGATIVE NEGATIVE   Ketones, ur 20 (A) NEGATIVE mg/dL   Protein, ur NEGATIVE NEGATIVE mg/dL   Nitrite POSITIVE (A) NEGATIVE   Leukocytes,Ua TRACE (A) NEGATIVE   RBC / HPF >50 (H) 0 - 5 RBC/hpf   WBC, UA 11-20 0 - 5 WBC/hpf   Bacteria, UA RARE (A) NONE SEEN   Squamous Epithelial / LPF 0-5 0 - 5   Mucus PRESENT     Comment: Performed at [facility], [address] [place], [place] [address]  Magnesium     Status: Abnormal   Collection Time: 07/10/21  8:34 AM  Result Value Ref Range   Magnesium 1.6 (L) 1.7 - 2.4 mg/dL    Comment: Performed at [facility], [address] [place], [place] [address]  I-Stat Beta hCG blood, ED ([facility], [facility], [facility] only)     Status: None   Collection Time: 07/10/21 10:13 AM  Result Value Ref Range   I-stat hCG, quantitative <5.0 <5 mIU/mL   Comment 3            Comment:   GEST. AGE      CONC.  (mIU/mL)   <=1 WEEK        5 - 50     2 WEEKS       50 - 500     3 WEEKS       100 - 10,000     4 WEEKS     1,000 - 30,000        FEMALE AND NON-PREGNANT FEMALE:     LESS THAN 5 mIU/mL   Resp Panel by RT-PCR (Flu A&B, Covid) Nasopharyngeal Swab     Status: None   Collection Time: 07/10/21 10:26 AM   Specimen: Nasopharyngeal Swab; Nasopharyngeal(NP) swabs in vial transport medium  Result Value Ref Range   SARS Coronavirus 2 by RT PCR NEGATIVE  NEGATIVE    Comment: (NOTE) SARS-CoV-2 target nucleic acids are NOT DETECTED.  The SARS-CoV-2 RNA is generally detectable in upper respiratory specimens during the acute phase of infection. The lowest concentration of SARS-CoV-2 viral copies this assay can detect is 138 copies/mL. A negative result does not preclude SARS-Cov-2 infection and should not be used as the sole basis for treatment or other patient management decisions. A negative result may occur with  improper specimen collection/handling, submission of specimen other than nasopharyngeal swab, presence of viral mutation(s) within the areas targeted by this assay, and inadequate number of viral copies(<138 copies/mL). A negative result must be combined with clinical observations, patient history, and epidemiological information. The expected result is Negative.  Fact Sheet for Patients:  BloggerCourse.comhttps://www.fda.gov/media/152166/download  Fact Sheet for Healthcare Providers:  SeriousBroker.ithttps://www.fda.gov/media/152162/download  This test is no t yet approved or cleared by the [place] FDA and  has been authorized for detection and/or diagnosis of SARS-CoV-2 by FDA under an Emergency Use Authorization (EUA). This EUA will remain  in effect (meaning this test can  be used) for the duration of the COVID-19 declaration under Section 564(b)(1) of the Act, 21 U.S.C.section 360bbb-3(b)(1), unless the authorization is terminated  or revoked sooner.       Influenza A by PCR NEGATIVE NEGATIVE   Influenza B by PCR NEGATIVE NEGATIVE    Comment: (NOTE) The Xpert Xpress SARS-CoV-2/FLU/RSV plus assay is intended as an aid in the diagnosis of influenza from Nasopharyngeal swab specimens and should not be used as a sole basis for treatment. Nasal washings and aspirates are unacceptable for Xpert Xpress SARS-CoV-2/FLU/RSV testing.  Fact Sheet for Patients: BloggerCourse.com  Fact Sheet for Healthcare  Providers: SeriousBroker.it  This test is not yet approved or cleared by the [place] FDA and has been authorized for detection and/or diagnosis of SARS-CoV-2 by FDA under an Emergency Use Authorization (EUA). This EUA will remain in effect (meaning this test can be used) for the duration of the COVID-19 declaration under Section 564(b)(1) of the Act, 21 U.S.C. section 360bbb-3(b)(1), unless the authorization is terminated or revoked.  Performed at [facility], [address], [place], [place] [address]   Lactic acid, plasma     Status: Abnormal   Collection Time: 07/10/21 10:35 AM  Result Value Ref Range   Lactic Acid, Venous 2.6 (HH) 0.5 - 1.9 mmol/L    Comment: CRITICAL RESULT CALLED TO, READ BACK BY AND VERIFIED WITH: GOSS,M AT 11:05AM ON 07/10/21 BY [place] Performed at [facility], [address], [place], [place] [address]    Recent Results (from the past 240 hour(s))  Resp Panel by RT-PCR (Flu A&B, Covid) Nasopharyngeal Swab     Status: None   Collection Time: 07/10/21 10:26 AM   Specimen: Nasopharyngeal Swab; Nasopharyngeal(NP) swabs in vial transport medium  Result Value Ref Range Status   SARS Coronavirus 2 by RT PCR NEGATIVE NEGATIVE Final    Comment: (NOTE) SARS-CoV-2 target nucleic acids are NOT DETECTED.  The SARS-CoV-2 RNA is generally detectable in upper respiratory specimens during the acute phase of infection. The lowest concentration of SARS-CoV-2 viral copies this assay can detect is 138 copies/mL. A negative result does not preclude SARS-Cov-2 infection and should not be used as the sole basis for treatment or other patient management decisions. A negative result may occur with  improper specimen collection/handling, submission of specimen other than nasopharyngeal swab, presence of viral mutation(s) within the areas targeted by this assay, and inadequate number of viral copies(<138 copies/mL). A negative result must be  combined with clinical observations, patient history, and epidemiological information. The expected result is Negative.  Fact Sheet for Patients:  BloggerCourse.com  Fact Sheet for Healthcare Providers:  SeriousBroker.it  This test is no t yet approved or cleared by the [place] FDA and  has been authorized for detection and/or diagnosis of SARS-CoV-2 by FDA under an Emergency Use Authorization (EUA). This EUA will remain  in effect (meaning this test can be used) for the duration of the COVID-19 declaration under Section 564(b)(1) of the Act, 21 U.S.C.section 360bbb-3(b)(1), unless the authorization is terminated  or revoked sooner.       Influenza A by PCR NEGATIVE NEGATIVE Final   Influenza B by PCR NEGATIVE NEGATIVE Final    Comment: (NOTE) The Xpert Xpress SARS-CoV-2/FLU/RSV plus assay is intended as an aid in the diagnosis of influenza from Nasopharyngeal swab specimens and should not be used as a sole basis for treatment. Nasal washings and aspirates are unacceptable for Xpert Xpress SARS-CoV-2/FLU/RSV testing.  Fact Sheet for Patients: BloggerCourse.com  Fact Sheet for Healthcare Providers: SeriousBroker.it  This test is not yet approved or cleared by the [place] FDA and has been authorized for detection and/or diagnosis of SARS-CoV-2 by FDA under an Emergency Use Authorization (EUA). This EUA will remain in effect (meaning this test can be used) for the duration of the COVID-19 declaration under Section 564(b)(1) of the Act, 21 U.S.C. section 360bbb-3(b)(1), unless the authorization is terminated or revoked.  Performed at [facility], [address] [place], [place] [address]    Creatinine: Recent Labs    07/10/21 0834  CREATININE 0.92   Baseline Creatinine: 0.92  Impression/Assessment:  35yo with right ureteral calculus, fever  Plan:  -We  discussed the management of kidney stones. These options include observation, ureteroscopy, shockwave lithotripsy (ESWL) and percutaneous nephrolithotomy (PCNL). We discussed which options are relevant to the patient's stone(s). We discussed the natural history of kidney stones as well as the complications of untreated stones and the impact on quality of life without treatment as well as with each of the above listed treatments. We also discussed the efficacy of each treatment in its ability to clear the stone burden. With any of these management options I discussed the signs and symptoms of infection and the need for emergent treatment should these be experienced. For each option we discussed the ability of each procedure to clear the patient of their stone burden.   For observation I described the risks which include but are not limited to silent renal damage, life-threatening infection, need for emergent surgery, failure to pass stone and pain.   For ureteroscopy I described the risks which include bleeding, infection, damage to contiguous structures, positioning injury, ureteral stricture, ureteral avulsion, ureteral injury, need for prolonged ureteral stent, inability to perform ureteroscopy, need for an interval procedure, inability to clear stone burden, stent discomfort/pain, heart attack, stroke, pulmonary embolus and the inherent risks with general anesthesia.   For shockwave lithotripsy I described the risks which include arrhythmia, kidney contusion, kidney hemorrhage, need for transfusion, pain, inability to adequately break up stone, inability to pass stone fragments, Steinstrasse, infection associated with obstructing stones, need for alternate surgical procedure, need for repeat shockwave lithotripsy, MI, CVA, PE and the inherent risks with anesthesia/conscious sedation.   For PCNL I described the risks including positioning injury, pneumothorax, hydrothorax, need for chest tube, inability to  clear stone burden, renal laceration, arterial venous fistula or malformation, need for embolization of kidney, loss of kidney or renal function, need for repeat procedure, need for prolonged nephrostomy tube, ureteral avulsion, MI, CVA, PE and the inherent risks of general anesthesia.   - The patient would like to proceed with right ureteral stent placement.  Audrey [name] 07/10/2021, 12:14 PM

## 2021-07-10 NOTE — ED Notes (Signed)
Patient transported to CT 

## 2021-07-10 NOTE — ED Notes (Signed)
Pt ambulated to restroom without assistance.

## 2021-07-10 NOTE — ED Notes (Signed)
Pt vomiting at this time. EDP notified 

## 2021-07-10 NOTE — Brief Op Note (Signed)
07/10/2021  1:52 PM  PATIENT:  Hospital doctor N [place]  36 y.o. female  PRE-OPERATIVE DIAGNOSIS:  Right calculi, Fever  POST-OPERATIVE DIAGNOSIS:  Right calculi, Fever  PROCEDURE:  Procedure(s): CYSTOSCOPY WITH RETROGRADE PYELOGRAM/URETERAL STENT PLACEMENT, URETERAL STONE EXTRACTION (Right)  SURGEON:  Surgeon(s) and Role:    * Teyonna Plaisted, Mardene Celeste, MD - Primary  PHYSICIAN ASSISTANT:   ASSISTANTS: none   ANESTHESIA:   general  EBL:  minimal   BLOOD ADMINISTERED:none  DRAINS: Urinary Catheter (Foley)   LOCAL MEDICATIONS USED:  NONE  SPECIMEN:  Source of Specimen:  right ureteral stone  DISPOSITION OF SPECIMEN:  PATHOLOGY  COUNTS:  YES  TOURNIQUET:  * No tourniquets in log *  DICTATION: .Note written in EPIC  PLAN OF CARE: Admit for overnight observation  PATIENT DISPOSITION:  PACU - hemodynamically stable.   Delay start of Pharmacological VTE agent (>24hrs) due to surgical blood loss or risk of bleeding: not applicable

## 2021-07-10 NOTE — Transfer of Care (Signed)
Immediate Anesthesia Transfer of Care Note  Patient: Audrey [name]  Procedure(s) Performed: CYSTOSCOPY WITH RETROGRADE PYELOGRAM/URETERAL STENT PLACEMENT, URETERAL STONE EXTRACTION (Right: Ureter)  Patient Location: PACU  Anesthesia Type:General  Level of Consciousness: awake  Airway & Oxygen Therapy: Patient Spontanous Breathing  Post-op Assessment: Report given to RN  Post vital signs: Reviewed and stable  Last Vitals:  Vitals Value Taken Time  BP 121/83 07/10/21 1410  Temp    Pulse 110 07/10/21 1412  Resp 20 07/10/21 1412  SpO2 96 % 07/10/21 1412  Vitals shown include unvalidated device data.  Last Pain:  Vitals:   07/10/21 1230  TempSrc:   PainSc: 4          Complications: No notable events documented.

## 2021-07-10 NOTE — ED Notes (Signed)
EDP made aware of temperature. 

## 2021-07-10 NOTE — Anesthesia Procedure Notes (Addendum)
Date/Time: 07/10/2021 1:30 PM Performed by: Ollen Bowl, CRNA Pre-anesthesia Checklist: Patient identified, Patient being monitored, Timeout performed, Emergency Drugs available and Suction available Patient Re-evaluated:Patient Re-evaluated prior to induction Oxygen Delivery Method: Circle system utilized Preoxygenation: Pre-oxygenation with 100% oxygen Induction Type: IV induction Ventilation: Mask ventilation without difficulty Laryngoscope Size: Mac and 3 Grade View: Grade I Tube type: Oral Tube size: 7.0 mm Number of attempts: 1 Airway Equipment and Method: Stylet Placement Confirmation: ETT inserted through vocal cords under direct vision, positive ETCO2 and breath sounds checked- equal and bilateral Secured at: 21 cm Tube secured with: Tape Dental Injury: Teeth and Oropharynx as per pre-operative assessment

## 2021-07-11 LAB — CBC
HCT: 35.8 % — ABNORMAL LOW (ref 36.0–46.0)
Hemoglobin: 10.9 g/dL — ABNORMAL LOW (ref 12.0–15.0)
MCH: 26.5 pg (ref 26.0–34.0)
MCHC: 30.4 g/dL (ref 30.0–36.0)
MCV: 86.9 fL (ref 80.0–100.0)
Platelets: 246 10*3/uL (ref 150–400)
RBC: 4.12 MIL/uL (ref 3.87–5.11)
RDW: 15.4 % (ref 11.5–15.5)
WBC: 10.9 10*3/uL — ABNORMAL HIGH (ref 4.0–10.5)
nRBC: 0 % (ref 0.0–0.2)

## 2021-07-11 LAB — BASIC METABOLIC PANEL
Anion gap: 8 (ref 5–15)
BUN: 13 mg/dL (ref 6–20)
CO2: 22 mmol/L (ref 22–32)
Calcium: 8.7 mg/dL — ABNORMAL LOW (ref 8.9–10.3)
Chloride: 108 mmol/L (ref 98–111)
Creatinine, Ser: 0.82 mg/dL (ref 0.44–1.00)
GFR, Estimated: >60 mL/min (ref >60)
Glucose, Bld: 119 mg/dL — ABNORMAL HIGH (ref 70–99)
Potassium: 4.1 mmol/L (ref 3.5–5.1)
Sodium: 138 mmol/L (ref 135–145)

## 2021-07-11 MED ORDER — OXYBUTYNIN CHLORIDE 5 MG PO TABS: 5.0000 mg | ORAL_TABLET | Freq: Three times a day (TID) | ORAL | 1 refills | Status: DC

## 2021-07-11 MED ORDER — CHLORHEXIDINE GLUCONATE CLOTH 2 % EX PADS
6.0000 | MEDICATED_PAD | Freq: Every day | CUTANEOUS | Status: DC
  Administered 2021-07-11: 6 via TOPICAL

## 2021-07-11 MED ORDER — CEFUROXIME AXETIL 500 MG PO TABS
500.0000 mg | ORAL_TABLET | Freq: Two times a day (BID) | ORAL | 0 refills | Status: DC
Start: 2021-07-11 — End: 2021-09-27

## 2021-07-11 MED ORDER — OXYCODONE-ACETAMINOPHEN 5-325 MG PO TABS: 1.0000 | ORAL_TABLET | ORAL | 0 refills | Status: DC | PRN

## 2021-07-11 MED ORDER — TAMSULOSIN HCL 0.4 MG PO CAPS: 0.4000 mg | ORAL_CAPSULE | Freq: Every day | ORAL | 1 refills | Status: DC

## 2021-07-11 NOTE — Anesthesia Postprocedure Evaluation (Signed)
Anesthesia Post Note  Patient: Audrey [name]  Procedure(s) Performed: CYSTOSCOPY WITH RETROGRADE PYELOGRAM/URETERAL STENT PLACEMENT, URETERAL STONE EXTRACTION (Right: Ureter)  Patient location during evaluation: PACU Anesthesia Type: General Pain management: pain level controlled Vital Signs Assessment: post-procedure vital signs reviewed and stable Respiratory status: spontaneous breathing Cardiovascular status: blood pressure returned to baseline Postop Assessment: no headache Anesthetic complications: no   No notable events documented.   Last Vitals:  Vitals:   07/11/21 0016 07/11/21 0527  BP: 100/62 98/61  Pulse: 77 79  Resp: 19 19  Temp: 36.7 C 36.8 C  SpO2: 95% 99%    Last Pain:  Vitals:   07/11/21 0527  TempSrc: Oral  PainSc:                  Tressie Stalker

## 2021-07-11 NOTE — Progress Notes (Signed)
°  Transition of Care (TOC) Screening Note   Patient Details  Name: Audrey [name] Date of Birth: 09-12-1985   Transition of Care [facility]) CM/SW Contact:    Villa Herb, LCSWA Phone Number: 07/11/2021, 11:46 AM    Transition of Care Department [facility]) has reviewed patient and no TOC needs have been identified at this time. We will continue to monitor patient advancement through interdisciplinary progression rounds. If new patient transition needs arise, please place a TOC consult.

## 2021-07-12 ENCOUNTER — Encounter (HOSPITAL_COMMUNITY): Payer: Self-pay | Admitting: Urology

## 2021-07-16 LAB — CALCULI, WITH PHOTOGRAPH (CLINICAL LAB)
Ammonium Acid Urate Calculi: 100 %
Weight Calculi: 10 mg

## 2021-07-22 DIAGNOSIS — R509 Fever, unspecified: Secondary | ICD-10-CM | POA: Diagnosis not present

## 2021-07-22 DIAGNOSIS — N1330 Unspecified hydronephrosis: Secondary | ICD-10-CM | POA: Diagnosis not present

## 2021-07-22 DIAGNOSIS — N201 Calculus of ureter: Secondary | ICD-10-CM | POA: Diagnosis not present

## 2021-07-22 NOTE — Discharge Summary (Signed)
Physician Discharge Summary  Patient ID: Audrey [name] MRN: [id number] DOB/AGE: August 20, 1985 35 y.o.  Admit date: 07/10/2021 Discharge date: 07/22/2021  Admission Diagnoses:  Hydronephrosis concurrent with and due to calculi of kidney and ureter  Discharge Diagnoses:  Principal Problem:   Hydronephrosis concurrent with and due to calculi of kidney and ureter   Past Medical History:  Diagnosis Date   Allergy    Anxiety    BV (bacterial vaginosis) 12/30/2012   Chicken pox    Chronic kidney disease    Contraceptive management 07/15/2013   Hay fever    History of kidney stones    Hypertension    Irregular bleeding 12/30/2012   Leukorrhea 10/07/2013   resolved s/p cryotherapy     Migraines    PONV (postoperative nausea and vomiting)    Postcoital bleeding 05/13/2013   Had [place] and IUD in place, bleeding stopped after doxycycline but started back, has normal period lite x 2 days but bleeds every time with sex, is bright red and has clots and cramps after sex    UTI (lower urinary tract infection) 08/24/2014   UTI (urinary tract infection)     Surgeries: Procedure(s): CYSTOSCOPY WITH RETROGRADE PYELOGRAM/URETERAL STENT PLACEMENT, URETERAL STONE EXTRACTION on 07/10/2021   Consultants (if any): Treatment Team:  Malen Gauze, MD  Discharged Condition: Improved  Hospital Course: Audrey [name] is an 36 y.o. female who was admitted 07/10/2021 with a diagnosis of Hydronephrosis concurrent with and due to calculi of kidney and ureter and went to the operating room on 07/10/2021 and underwent the above named procedures.    She was given perioperative antibiotics:  Anti-infectives (From admission, onward)    Start     Dose/Rate Route Frequency Ordered Stop   07/11/21 1000  cefTRIAXone (ROCEPHIN) 2 g in sodium chloride 0.9 % 100 mL IVPB  Status:  Discontinued        2 g 200 mL/hr over 30 Minutes Intravenous Every 24 hours 07/10/21 1453 07/11/21 2142   07/11/21 0000  cefUROXime (CEFTIN)  500 MG tablet        500 mg Oral 2 times daily with meals 07/11/21 1533     07/10/21 1145  cefTRIAXone (ROCEPHIN) 2 g in sodium chloride 0.9 % 100 mL IVPB        2 g 200 mL/hr over 30 Minutes Intravenous  Once 07/10/21 1132 07/10/21 1250     .  She was given sequential compression devices, early ambulation, for DVT prophylaxis.  She benefited maximally from the hospital stay and there were no complications.    Recent vital signs:  Vitals:   07/11/21 1221 07/11/21 1426  BP: 107/71 97/68  Pulse: 87 (!) 57  Resp:  19  Temp:  (!) 97.4 F (36.3 C)  SpO2: 97% 98%    Recent laboratory studies:  Lab Results  Component Value Date   HGB 10.9 (L) 07/11/2021   HGB 12.5 07/10/2021   HGB 14.0 05/12/2021   Lab Results  Component Value Date   WBC 10.9 (H) 07/11/2021   PLT 246 07/11/2021   No results found for: INR Lab Results  Component Value Date   NA 138 07/11/2021   K 4.1 07/11/2021   CL 108 07/11/2021   CO2 22 07/11/2021   BUN 13 07/11/2021   CREATININE 0.82 07/11/2021   GLUCOSE 119 (H) 07/11/2021    Discharge Medications:   Allergies as of 07/11/2021       Reactions   Bee Venom Shortness Of  Breath   Codeine Itching   Penicillins Hives   Has patient had a PCN reaction causing immediate rash, facial/tongue/throat swelling, SOB or lightheadedness with hypotension: no Has patient had a PCN reaction causing severe rash involving mucus membranes or skin necrosis: no Has patient had a PCN reaction that required hospitalization no Has patient had a PCN reaction occurring within the last 10 years: no If all of the above answers are "NO", then may proceed with Cephalosporin use.   Percocet [oxycodone-acetaminophen] Hives, Itching   Tape Hives   Bactrim [sulfamethoxazole-trimethoprim] Itching, Rash        Medication List     TAKE these medications    Calcium Citrate 1040 MG Tabs Take 1 tablet by mouth daily.   cefUROXime 500 MG tablet Commonly known as:  CEFTIN Take 1 tablet (500 mg total) by mouth 2 (two) times daily with a meal.   Cholecalciferol 25 MCG (1000 UT) tablet Take 1 tablet by mouth daily.   Iron (Ferrous Sulfate) 325 (65 Fe) MG Tabs Take 325 mg by mouth daily.   multivitamin tablet Take 1 tablet by mouth daily. Takes 2 gummies daily   oxybutynin 5 MG tablet Commonly known as: DITROPAN Take 1 tablet (5 mg total) by mouth 3 (three) times daily.   oxyCODONE-acetaminophen 5-325 MG tablet Commonly known as: Percocet Take 1 tablet by mouth every 4 (four) hours as needed for severe pain.   tamsulosin 0.4 MG Caps capsule Commonly known as: Flomax Take 1 capsule (0.4 mg total) by mouth daily after supper.        Diagnostic Studies: CT ABDOMEN PELVIS W CONTRAST  Result Date: 07/10/2021 CLINICAL DATA:  Right lower quadrant abdominal pain starting at 4 a.m. today EXAM: CT ABDOMEN AND PELVIS WITH CONTRAST TECHNIQUE: Multidetector CT imaging of the abdomen and pelvis was performed using the standard protocol following bolus administration of intravenous contrast. RADIATION DOSE REDUCTION: This exam was performed according to the departmental dose-optimization program which includes automated exposure control, adjustment of the mA and/or kV according to patient size and/or use of iterative reconstruction technique. CONTRAST:  OMNIPAQUE IOHEXOL 300 MG/ML  SOLN COMPARISON:  Pelvic ultrasound 07/10/2021 and upper abdominal ultrasound from 03/05/2014 FINDINGS: Lower chest: Unremarkable Hepatobiliary: 0.9 cm in diameter rim calcified lesion in segment 2 of the liver on image 8 of series 2, no change from 01/06/2017, likely a benign rim calcified cyst or similar benign incidental lesion. Local hypodensity in segment 3 of the liver partially adjacent to the falciform ligament, nonspecific although I favor focal fatty infiltration given the morphology and appearance. Cholecystectomy noted. Pancreas: Unremarkable Spleen: Unremarkable  Adrenals/Urinary Tract: Punctate calcification in the medial limb left adrenal gland is unchanged from 2018. No associated soft tissue mass. Moderate right hydronephrosis with prominent proximal and moderate distal right hydroureter extending down to a 5 by 3 mm right UVJ stone. Mildly delayed phase of right renal enhancement compared to the left. No other stones. No left hydronephrosis. Stomach/Bowel: Prior sleeve partial gastrectomy. No complicating features are appreciable. Small ring density along adipose tissue anterior to the descending colon on image 44 of series 2 but without surrounding inflammatory stranding, this could reflect a small remote focus of fat necrosis or residua from remote inflammation of the appendage epiploica. Normal appendix. Vascular/Lymphatic: Unremarkable Reproductive: 3.1 cm mildly complex right ovarian cyst as shown on recent ultrasound. Other: Trace free pelvic fluid in the cul-de-sac surrounding a 1.0 cm rim calcified structure on image 76 series 2. Musculoskeletal: Transitional S1  vertebra. Levoconvex lumbar scoliosis although some of this may be due to rightward splinting. IMPRESSION: 1. Substantial right hydronephrosis and hydroureter extending down to a 5 by 3 mm right UVJ stone. Mildly delayed phase of right renal enhancement compared to the left. No other stones identified. This is highly likely to be the cause of the patient's right-sided abdominal pain. 2. Trace free pelvic fluid in the cul-de-sac surrounding a 1.0 cm rim calcified structure favoring peritoneal loose body. 3. Local hypodensity in segment 3 of the liver is technically nonspecific although most likely to be due to focal steatosis. If the patient has abnormal liver enzymes or if otherwise clinically warranted, this could be further worked up with dedicated hepatic protocol MRI with and without contrast. 4. Prior sleeve partial gastrectomy without complicating feature identified. Electronically Signed   By:  Gaylyn Rong M.D.   On: 07/10/2021 11:24   DG C-Arm 1-60 Min-No Report  Result Date: 07/10/2021 Fluoroscopy was utilized by the requesting physician.  No radiographic interpretation.   US PELVIC COMPLETE W TRANSVAGINAL AND TORSION R/O  Result Date: 07/10/2021 CLINICAL DATA:  Right pelvic pain EXAM: TRANSABDOMINAL AND TRANSVAGINAL ULTRASOUND OF PELVIS DOPPLER ULTRASOUND OF OVARIES TECHNIQUE: Both transabdominal and transvaginal ultrasound examinations of the pelvis were performed. Transabdominal technique was performed for global imaging of the pelvis including uterus, ovaries, adnexal regions, and pelvic cul-de-sac. It was necessary to proceed with endovaginal exam following the transabdominal exam to visualize the endometrium and ovaries. Color and duplex Doppler ultrasound was utilized to evaluate blood flow to the ovaries. COMPARISON:  None. FINDINGS: Uterus Measurements: 9.5 x 5.2 x 6.8 = volume: 170 mL. No fibroids or other mass visualized. Multiple nabothian cysts. Endometrium Thickness: 1.7 cm.  No focal abnormality visualized. Right ovary Measurements: 4.2 x 3.8 x 3.4 = volume: 28 mL. Normal blood flow to the right ovary. Heterogeneous cystic structure measuring 3.2 x 2.7 x 3.2 cm. Left ovary Not visualized. Pulsed Doppler evaluation of right ovary demonstrates normal low-resistance arterial and venous waveforms. Other findings No abnormal free fluid. IMPRESSION: 1. Prominent endometrium measuring up to 1.7 cm without appreciable focal abnormality. 2. Normal blood flow to the right ovary without evidence of torsion. Left ovary not visualized. Electronically Signed   By: Larose Hires D.O.   On: 07/10/2021 10:31    Disposition: Discharge disposition: 01-Home or Self Care       Discharge Instructions     Discharge patient   Complete by: As directed    Discharge disposition: 01-Home or Self Care   Discharge patient date: 07/11/2021        Follow-up Information     Kynleigh Artz,  Mardene Celeste, MD. Call in 2 week(s).   Specialty: Urology Contact information: [address]  [place] [address] [phone]                  Signed: Wilkie Aye 07/22/2021, 7:12 PM

## 2021-07-22 NOTE — Op Note (Signed)
.  Preoperative diagnosis: right UVJ stone, fever  Postoperative diagnosis: Same  Procedure: 1 cystoscopy 2. right retrograde pyelography 3.  Intraoperative fluoroscopy, under one hour, with interpretation 4. right 6 x 26 JJ stent placement 5. Right ureteroscopic stone manipulation with basket extraction  Attending: Nicolette Bang  Anesthesia: General  Estimated blood loss: None  Drains: Right 6 x 26 JJ ureteral stent without tether, 16 French foley catheter  Specimens: stone for analysis  Antibiotics: rocephin  Findings: crowning right ureteral calculus successfully removed. Moderate right hydronephrosis. No masses/lesions in the bladder. Ureteral orifices in normal anatomic location.  Indications: Patient is a 36 year old female with a history of right ureteral stone and concern for sepsis.  After discussing treatment options, they decided proceed with right stent placement.  Procedure in detail: The patient was brought to the operating room and a brief timeout was done to ensure correct patient, correct procedure, correct site.  General anesthesia was administered patient was placed in dorsal lithotomy position.  Their genitalia was then prepped and draped in usual sterile fashion.  A rigid 27 French cystoscope was passed in the urethra and the bladder.  Bladder was inspected free masses or lesions.  the ureteral orifices were in the normal orthotopic locations.  We noted a crowning right ureteral calculus and using a NGage basket the stone was removed from the right ureter. a 6 french ureteral catheter was then instilled into the right ureteral orifice.  a gentle retrograde was obtained and findings noted above.  we then placed a zip wire through the ureteral catheter and advanced up to the renal pelvis.    We then placed a 6 x 26 double-j ureteral stent over the original zip wire.  We then removed the wire and good coil was noted in the the renal pelvis under fluoroscopy and the  bladder under direct vision.  A foley catheter was then placed. the bladder was then drained and this concluded the procedure which was well tolerated by patient.  Complications: None  Condition: Stable, extubated, transferred to PACU  Plan: Patient is to be admitted for IV antibiotics.

## 2021-07-24 ENCOUNTER — Ambulatory Visit (INDEPENDENT_AMBULATORY_CARE_PROVIDER_SITE_OTHER): Payer: 59 | Admitting: Urology

## 2021-07-24 ENCOUNTER — Encounter: Payer: Medicaid Other | Admitting: Urology

## 2021-07-24 ENCOUNTER — Encounter: Payer: Self-pay | Admitting: Urology

## 2021-07-24 ENCOUNTER — Other Ambulatory Visit: Payer: Self-pay

## 2021-07-24 VITALS — BP 110/82 | HR 92

## 2021-07-24 DIAGNOSIS — N201 Calculus of ureter: Secondary | ICD-10-CM | POA: Diagnosis not present

## 2021-07-24 MED ORDER — CIPROFLOXACIN HCL 500 MG PO TABS
500.0000 mg | ORAL_TABLET | Freq: Once | ORAL | Status: AC
  Administered 2021-07-24: 500 mg via ORAL

## 2021-07-24 NOTE — Progress Notes (Signed)
° °  07/24/21  CC: nephrolithiasis   HPI: Ms Bubier is a 36yo here for right ureteral stent removal Blood pressure 110/82, pulse 92. NED. A&Ox3.   No respiratory distress   Abd soft, NT, ND Normal external genitalia with patent urethral meatus  Cystoscopy Procedure Note  Patient identification was confirmed, informed consent was obtained, and patient was prepped using Betadine solution.  Lidocaine jelly was administered per urethral meatus.    Procedure: - Flexible cystoscope introduced, without any difficulty.   - Thorough search of the bladder revealed:    normal urethral meatus    normal urothelium    no stones    no ulcers     no tumors    no urethral polyps    no trabeculation Using a grasper the right ureteral stent was removed intact. Significant calcification was on the proximal and distal end of the stent  - Ureteral orifices were normal in position and appearance.  Post-Procedure: - Patient tolerated the procedure well  Assessment/ Plan: Metabolic evaluation. RTC 6 weeks with a renal US   No follow-ups on file.  Nicolette Bang, MD

## 2021-07-24 NOTE — Patient Instructions (Signed)
Dietary Guidelines to Help Prevent Kidney Stones Kidney stones are deposits of minerals and salts that form inside your kidneys. Your risk of developing kidney stones may be greater depending on your diet, your lifestyle, the medicines you take, and whether you have certain medical conditions. Most people can lower their chances of developing kidney stones by following the instructions below. Your dietitian may give you more specific instructions depending on your overall health and the type of kidney stones you tend to develop. What are tips for following this plan? Reading food labels  Choose foods with "no salt added" or "low-salt" labels. Limit your salt (sodium) intake to less than 1,500 mg a day. Choose foods with calcium for each meal and snack. Try to eat about 300 mg of calcium at each meal. Foods that contain 200-500 mg of calcium a serving include: 8 oz (237 mL) of milk, calcium-fortifiednon-dairy milk, and calcium-fortifiedfruit juice. Calcium-fortified means that calcium has been added to these drinks. 8 oz (237 mL) of kefir, yogurt, and soy yogurt. 4 oz (114 g) of tofu. 1 oz (28 g) of cheese. 1 cup (150 g) of dried figs. 1 cup (91 g) of cooked broccoli. One 3 oz (85 g) can of sardines or mackerel. Most people need 1,000-1,500 mg of calcium a day. Talk to your dietitian about how much calcium is recommended for you. Shopping Buy plenty of fresh fruits and vegetables. Most people do not need to avoid fruits and vegetables, even if these foods contain nutrients that may contribute to kidney stones. When shopping for convenience foods, choose: Whole pieces of fruit. Pre-made salads with dressing on the side. Low-fat fruit and yogurt smoothies. Avoid buying frozen meals or prepared deli foods. These can be high in sodium. Look for foods with live cultures, such as yogurt and kefir. Choose high-fiber grains, such as whole-wheat breads, oat bran, and wheat cereals. Cooking Do not add  salt to food when cooking. Place a salt shaker on the table and allow each person to add his or her own salt to taste. Use vegetable protein, such as beans, textured vegetable protein (TVP), or tofu, instead of meat in pasta, casseroles, and soups. Meal planning Eat less salt, if told by your dietitian. To do this: Avoid eating processed or pre-made food. Avoid eating fast food. Eat less animal protein, including cheese, meat, poultry, or fish, if told by your dietitian. To do this: Limit the number of times you have meat, poultry, fish, or cheese each week. Eat a diet free of meat at least 2 days a week. Eat only one serving each day of meat, poultry, fish, or seafood. When you prepare animal protein, cut pieces into small portion sizes. For most meat and fish, one serving is about the size of the palm of your hand. Eat at least five servings of fresh fruits and vegetables each day. To do this: Keep fruits and vegetables on hand for snacks. Eat one piece of fruit or a handful of berries with breakfast. Have a salad and fruit at lunch. Have two kinds of vegetables at dinner. Limit foods that are high in a substance called oxalate. These include: Spinach (cooked), rhubarb, beets, sweet potatoes, and Swiss chard. Peanuts. Potato chips, french fries, and baked potatoes with skin on. Nuts and nut products. Chocolate. If you regularly take a diuretic medicine, make sure to eat at least 1 or 2 servings of fruits or vegetables that are high in potassium each day. These include: Avocado. Banana. Orange, prune,   carrot, or tomato juice. Baked potato. Cabbage. Beans and split peas. Lifestyle  Drink enough fluid to keep your urine pale yellow. This is the most important thing you can do. Spread your fluid intake throughout the day. If you drink alcohol: Limit how much you use to: 0-1 drink a day for women who are not pregnant. 0-2 drinks a day for men. Be aware of how much alcohol is in your  drink. In the [place] one drink equals one 12 oz bottle of beer (355 mL), one 5 oz glass of wine (148 mL), or one 1 oz glass of hard liquor (44 mL). Lose weight if told by your health care provider. Work with your dietitian to find an eating plan and weight loss strategies that work best for you. General information Talk to your health care provider and dietitian about taking daily supplements. You may be told the following depending on your health and the cause of your kidney stones: Not to take supplements with vitamin C. To take a calcium supplement. To take a daily probiotic supplement. To take other supplements such as magnesium, fish oil, or vitamin B6. Take over-the-counter and prescription medicines only as told by your health care provider. These include supplements. What foods should I limit? Limit your intake of the following foods, or eat them as told by your dietitian. Vegetables Spinach. Rhubarb. Beets. Canned vegetables. Pickles. Olives. Baked potatoes with skin. Grains Wheat bran. Baked goods. Salted crackers. Cereals high in sugar. Meats and other proteins Nuts. Nut butters. Large portions of meat, poultry, or fish. Salted, precooked, or cured meats, such as sausages, meat loaves, and hot dogs. Dairy Cheese. Beverages Regular soft drinks. Regular vegetable juice. Seasonings and condiments Seasoning blends with salt. Salad dressings. Soy sauce. Ketchup. Barbecue sauce. Other foods Canned soups. Canned pasta sauce. Casseroles. Pizza. Lasagna. Frozen meals. Potato chips. French fries. The items listed above may not be a complete list of foods and beverages you should limit. Contact a dietitian for more information. What foods should I avoid? Talk to your dietitian about specific foods you should avoid based on the type of kidney stones you have and your overall health. Fruits Grapefruit. The item listed above may not be a complete list of foods and beverages you should  avoid. Contact a dietitian for more information. Summary Kidney stones are deposits of minerals and salts that form inside your kidneys. You can lower your risk of kidney stones by making changes to your diet. The most important thing you can do is drink enough fluid. Drink enough fluid to keep your urine pale yellow. Talk to your dietitian about how much calcium you should have each day, and eat less salt and animal protein as told by your dietitian. This information is not intended to replace advice given to you by your health care provider. Make sure you discuss any questions you have with your health care provider. Document Revised: 05/06/2019 Document Reviewed: 05/06/2019 [employer] Patient Education  2022 [employer]  

## 2021-07-25 LAB — BASIC METABOLIC PANEL
BUN/Creatinine Ratio: 12 (ref 9–23)
BUN: 11 mg/dL (ref 6–20)
CO2: 22 mmol/L (ref 20–29)
Calcium: 9.5 mg/dL (ref 8.7–10.2)
Chloride: 104 mmol/L (ref 96–106)
Creatinine, Ser: 0.95 mg/dL (ref 0.57–1.00)
Glucose: 95 mg/dL (ref 70–99)
Potassium: 4 mmol/L (ref 3.5–5.2)
Sodium: 141 mmol/L (ref 134–144)
eGFR: 80 mL/min/{1.73_m2} (ref >59)

## 2021-07-25 LAB — PTH, INTACT AND CALCIUM: PTH: 62 pg/mL (ref 15–65)

## 2021-07-25 LAB — URIC ACID: Uric Acid: 6.4 mg/dL — ABNORMAL HIGH (ref 2.6–6.2)

## 2021-08-15 ENCOUNTER — Encounter: Payer: Medicaid Other | Admitting: Urology

## 2021-08-21 ENCOUNTER — Other Ambulatory Visit: Payer: Self-pay

## 2021-08-21 ENCOUNTER — Ambulatory Visit (HOSPITAL_COMMUNITY)
Admission: RE | Admit: 2021-08-21 | Discharge: 2021-08-21 | Disposition: A | Discharge status: Home / Self Care | Payer: 59 | Source: Ambulatory Visit | Attending: Urology | Admitting: Urology

## 2021-08-21 DIAGNOSIS — N201 Calculus of ureter: Secondary | ICD-10-CM | POA: Diagnosis not present

## 2021-09-10 ENCOUNTER — Ambulatory Visit: Payer: 59 | Admitting: Urology

## 2021-09-10 DIAGNOSIS — N201 Calculus of ureter: Secondary | ICD-10-CM

## 2021-09-13 ENCOUNTER — Other Ambulatory Visit: Payer: Self-pay | Admitting: Urology

## 2021-09-26 ENCOUNTER — Ambulatory Visit (INDEPENDENT_AMBULATORY_CARE_PROVIDER_SITE_OTHER): Payer: 59 | Admitting: Urology

## 2021-09-26 ENCOUNTER — Encounter: Payer: Self-pay | Admitting: Urology

## 2021-09-26 VITALS — BP 112/78 | HR 90

## 2021-09-26 DIAGNOSIS — N201 Calculus of ureter: Secondary | ICD-10-CM

## 2021-09-26 LAB — URINALYSIS, ROUTINE W REFLEX MICROSCOPIC
Bilirubin, UA: NEGATIVE
Glucose, UA: NEGATIVE
Leukocytes,UA: NEGATIVE
Nitrite, UA: NEGATIVE
Protein,UA: NEGATIVE
RBC, UA: NEGATIVE
Specific Gravity, UA: >1.03 — ABNORMAL HIGH (ref 1.005–1.030)
Urobilinogen, Ur: 2 mg/dL — ABNORMAL HIGH (ref 0.2–1.0)
pH, UA: 5.5 (ref 5.0–7.5)

## 2021-09-26 MED ORDER — ALLOPURINOL 300 MG PO TABS: 300.0000 mg | ORAL_TABLET | Freq: Every day | ORAL | 11 refills | Status: DC

## 2021-09-26 NOTE — Patient Instructions (Signed)
Dietary Guidelines to Help Prevent Kidney Stones Kidney stones are deposits of minerals and salts that form inside your kidneys. Your risk of developing kidney stones may be greater depending on your diet, your lifestyle, the medicines you take, and whether you have certain medical conditions. Most people can lower their chances of developing kidney stones by following the instructions below. Your dietitian may give you more specific instructions depending on your overall health and the type of kidney stones you tend to develop. What are tips for following this plan? Reading food labels  Choose foods with "no salt added" or "low-salt" labels. Limit your salt (sodium) intake to less than 1,500 mg a day. Choose foods with calcium for each meal and snack. Try to eat about 300 mg of calcium at each meal. Foods that contain 200-500 mg of calcium a serving include: 8 oz (237 mL) of milk, calcium-fortifiednon-dairy milk, and calcium-fortifiedfruit juice. Calcium-fortified means that calcium has been added to these drinks. 8 oz (237 mL) of kefir, yogurt, and soy yogurt. 4 oz (114 g) of tofu. 1 oz (28 g) of cheese. 1 cup (150 g) of dried figs. 1 cup (91 g) of cooked broccoli. One 3 oz (85 g) can of sardines or mackerel. Most people need 1,000-1,500 mg of calcium a day. Talk to your dietitian about how much calcium is recommended for you. Shopping Buy plenty of fresh fruits and vegetables. Most people do not need to avoid fruits and vegetables, even if these foods contain nutrients that may contribute to kidney stones. When shopping for convenience foods, choose: Whole pieces of fruit. Pre-made salads with dressing on the side. Low-fat fruit and yogurt smoothies. Avoid buying frozen meals or prepared deli foods. These can be high in sodium. Look for foods with live cultures, such as yogurt and kefir. Choose high-fiber grains, such as whole-wheat breads, oat bran, and wheat cereals. Cooking Do not add  salt to food when cooking. Place a salt shaker on the table and allow each person to add his or her own salt to taste. Use vegetable protein, such as beans, textured vegetable protein (TVP), or tofu, instead of meat in pasta, casseroles, and soups. Meal planning Eat less salt, if told by your dietitian. To do this: Avoid eating processed or pre-made food. Avoid eating fast food. Eat less animal protein, including cheese, meat, poultry, or fish, if told by your dietitian. To do this: Limit the number of times you have meat, poultry, fish, or cheese each week. Eat a diet free of meat at least 2 days a week. Eat only one serving each day of meat, poultry, fish, or seafood. When you prepare animal protein, cut pieces into small portion sizes. For most meat and fish, one serving is about the size of the palm of your hand. Eat at least five servings of fresh fruits and vegetables each day. To do this: Keep fruits and vegetables on hand for snacks. Eat one piece of fruit or a handful of berries with breakfast. Have a salad and fruit at lunch. Have two kinds of vegetables at dinner. Limit foods that are high in a substance called oxalate. These include: Spinach (cooked), rhubarb, beets, sweet potatoes, and Swiss chard. Peanuts. Potato chips, french fries, and baked potatoes with skin on. Nuts and nut products. Chocolate. If you regularly take a diuretic medicine, make sure to eat at least 1 or 2 servings of fruits or vegetables that are high in potassium each day. These include: Avocado. Banana. Orange, prune,   carrot, or tomato juice. Baked potato. Cabbage. Beans and split peas. Lifestyle  Drink enough fluid to keep your urine pale yellow. This is the most important thing you can do. Spread your fluid intake throughout the day. If you drink alcohol: Limit how much you use to: 0-1 drink a day for women who are not pregnant. 0-2 drinks a day for men. Be aware of how much alcohol is in your  drink. In the [place] one drink equals one 12 oz bottle of beer (355 mL), one 5 oz glass of wine (148 mL), or one 1 oz glass of hard liquor (44 mL). Lose weight if told by your health care provider. Work with your dietitian to find an eating plan and weight loss strategies that work best for you. General information Talk to your health care provider and dietitian about taking daily supplements. You may be told the following depending on your health and the cause of your kidney stones: Not to take supplements with vitamin C. To take a calcium supplement. To take a daily probiotic supplement. To take other supplements such as magnesium, fish oil, or vitamin B6. Take over-the-counter and prescription medicines only as told by your health care provider. These include supplements. What foods should I limit? Limit your intake of the following foods, or eat them as told by your dietitian. Vegetables Spinach. Rhubarb. Beets. Canned vegetables. Pickles. Olives. Baked potatoes with skin. Grains Wheat bran. Baked goods. Salted crackers. Cereals high in sugar. Meats and other proteins Nuts. Nut butters. Large portions of meat, poultry, or fish. Salted, precooked, or cured meats, such as sausages, meat loaves, and hot dogs. Dairy Cheese. Beverages Regular soft drinks. Regular vegetable juice. Seasonings and condiments Seasoning blends with salt. Salad dressings. Soy sauce. Ketchup. Barbecue sauce. Other foods Canned soups. Canned pasta sauce. Casseroles. Pizza. Lasagna. Frozen meals. Potato chips. French fries. The items listed above may not be a complete list of foods and beverages you should limit. Contact a dietitian for more information. What foods should I avoid? Talk to your dietitian about specific foods you should avoid based on the type of kidney stones you have and your overall health. Fruits Grapefruit. The item listed above may not be a complete list of foods and beverages you should  avoid. Contact a dietitian for more information. Summary Kidney stones are deposits of minerals and salts that form inside your kidneys. You can lower your risk of kidney stones by making changes to your diet. The most important thing you can do is drink enough fluid. Drink enough fluid to keep your urine pale yellow. Talk to your dietitian about how much calcium you should have each day, and eat less salt and animal protein as told by your dietitian. This information is not intended to replace advice given to you by your health care provider. Make sure you discuss any questions you have with your health care provider. Document Revised: 01/22/2021 Document Reviewed: 01/22/2021 Elsevier Patient Education  2023 Elsevier Inc.  

## 2021-09-26 NOTE — Progress Notes (Signed)
? ?09/26/2021 ?2:55 PM  ? ?[facility] ?[birth year] ?[id number] ? ?Referring provider: Nelwyn Salisbury, MD ?[phone] ?[place],  [place] [address] ? ?Nephrolithiasis ? ? ?HPI: ?Audrey [name] is a 35yo here for followup for nephrolithiasis. 24 hour urine shows low volume 360cc, normal citrate, high SS Uric acid. No stone events since last visit. Renal US 3/28 shows no calculi and no hydronephrosis. NO other complaints today.  ? ? ?PMH: ?Past Medical History:  ?Diagnosis Date  ? Allergy   ? Anxiety   ? BV (bacterial vaginosis) 12/30/2012  ? Chicken pox   ? Chronic kidney disease   ? Contraceptive management 07/15/2013  ? Hay fever   ? History of kidney stones   ? Hypertension   ? Irregular bleeding 12/30/2012  ? Leukorrhea 10/07/2013  ? resolved s/p cryotherapy    ? Migraines   ? PONV (postoperative nausea and vomiting)   ? Postcoital bleeding 05/13/2013  ? Had [place] and IUD in place, bleeding stopped after doxycycline but started back, has normal period lite x 2 days but bleeds every time with sex, is bright red and has clots and cramps after sex   ? UTI (lower urinary tract infection) 08/24/2014  ? UTI (urinary tract infection)   ? ? ?Surgical History: ?Past Surgical History:  ?Procedure Laterality Date  ? CHOLECYSTECTOMY N/A 03/09/2014  ? Procedure: LAPAROSCOPIC CHOLECYSTECTOMY;  Surgeon: Dalia Heading, MD;  Location: [facility];  Service: General;  Laterality: N/A;  ? CYSTOSCOPY W/ URETERAL STENT PLACEMENT Right 07/10/2021  ? Procedure: CYSTOSCOPY WITH RETROGRADE PYELOGRAM/URETERAL STENT PLACEMENT, URETERAL STONE EXTRACTION;  Surgeon: Malen Gauze, MD;  Location: [facility];  Service: Urology;  Laterality: Right;  ? DILATION AND CURETTAGE OF UTERUS    ? LAPAROSCOPIC GASTRIC SLEEVE RESECTION    ? TONSILLECTOMY    ? ? ?Home Medications:  ?Allergies as of 09/26/2021   ? ?   Reactions  ? Bee Venom Shortness Of Breath  ? Codeine Itching  ? Penicillins Hives  ? Has patient had a PCN reaction causing immediate rash,  facial/tongue/throat swelling, SOB or lightheadedness with hypotension: no ?Has patient had a PCN reaction causing severe rash involving mucus membranes or skin necrosis: no ?Has patient had a PCN reaction that required hospitalization no ?Has patient had a PCN reaction occurring within the last 10 years: no ?If all of the above answers are "NO", then may proceed with Cephalosporin use.  ? Percocet [oxycodone-acetaminophen] Hives, Itching  ? Tape Hives  ? Bactrim [sulfamethoxazole-trimethoprim] Itching, Rash  ? ?  ? ?  ?Medication List  ?  ? ?  ? Accurate as of Sep 26, 2021  2:55 PM. If you have any questions, ask your nurse or doctor.  ?  ?  ? ?  ? ?Calcium Citrate 1040 MG Tabs ?Take 1 tablet by mouth daily. ?  ?cefUROXime 500 MG tablet ?Commonly known as: CEFTIN ?Take 1 tablet (500 mg total) by mouth 2 (two) times daily with a meal. ?  ?Cholecalciferol 25 MCG (1000 UT) tablet ?Take 1 tablet by mouth daily. ?  ?Iron (Ferrous Sulfate) 325 (65 Fe) MG Tabs ?Take 325 mg by mouth daily. ?  ?multivitamin tablet ?Take 1 tablet by mouth daily. Takes 2 gummies daily ?  ?oxybutynin 5 MG tablet ?Commonly known as: DITROPAN ?Take 1 tablet (5 mg total) by mouth 3 (three) times daily. ?  ?oxyCODONE-acetaminophen 5-325 MG tablet ?Commonly known as: Percocet ?Take 1 tablet by mouth every 4 (four) hours as  needed for severe pain. ?  ?tamsulosin 0.4 MG Caps capsule ?Commonly known as: Flomax ?Take 1 capsule (0.4 mg total) by mouth daily after supper. ?  ? ?  ? ? ?Allergies:  ?Allergies  ?Allergen Reactions  ? Bee Venom Shortness Of Breath  ? Codeine Itching  ? Penicillins Hives  ?  Has patient had a PCN reaction causing immediate rash, facial/tongue/throat swelling, SOB or lightheadedness with hypotension: no ?Has patient had a PCN reaction causing severe rash involving mucus membranes or skin necrosis: no ?Has patient had a PCN reaction that required hospitalization no ?Has patient had a PCN reaction occurring within the last 10  years: no ?If all of the above answers are "NO", then may proceed with Cephalosporin use. ?  ? Percocet [Oxycodone-Acetaminophen] Hives and Itching  ? Tape Hives  ? Bactrim [Sulfamethoxazole-Trimethoprim] Itching and Rash  ? ? ?Family History: ?Family History  ?Problem Relation Age of Onset  ? Arthritis Mother   ? Lung cancer Mother   ? Hyperlipidemia Mother   ? Atrial fibrillation Mother   ? Stroke Mother   ? Hypertension Mother   ? Diabetes Father   ? Dementia Father   ? Breast cancer Maternal Aunt   ? Diabetes Paternal Uncle   ? ? ?Social History:  reports that she has never smoked. She has never used smokeless tobacco. She reports that she does not drink alcohol and does not use drugs. ? ?ROS: ?All other review of systems were reviewed and are negative except what is noted above in HPI ? ?Physical Exam: ?BP 112/78   Pulse 90   ?Constitutional:  Alert and oriented, No acute distress. ?HEENT: [place] AT, moist mucus membranes.  Trachea midline, no masses. ?Cardiovascular: No clubbing, cyanosis, or edema. ?Respiratory: Normal respiratory effort, no increased work of breathing. ?GI: Abdomen is soft, nontender, nondistended, no abdominal masses ?GU: No CVA tenderness.  ?Lymph: No cervical or inguinal lymphadenopathy. ?Skin: No rashes, bruises or suspicious lesions. ?Neurologic: Grossly intact, no focal deficits, moving all 4 extremities. ?Psychiatric: Normal mood and affect. ? ?Laboratory Data: ?Lab Results  ?Component Value Date  ? WBC 10.9 (H) 07/11/2021  ? HGB 10.9 (L) 07/11/2021  ? HCT 35.8 (L) 07/11/2021  ? MCV 86.9 07/11/2021  ? PLT 246 07/11/2021  ? ? ?Lab Results  ?Component Value Date  ? CREATININE 0.95 07/24/2021  ? ? ?No results found for: PSA ? ?No results found for: TESTOSTERONE ? ?Lab Results  ?Component Value Date  ? HGBA1C 5.3 01/30/2021  ? ? ?Urinalysis ?   ?Component Value Date/Time  ? COLORURINE YELLOW 07/10/2021 0834  ? APPEARANCEUR HAZY (A) 07/10/2021 0834  ? APPEARANCEUR Cloudy (A) 05/15/2016 1629   ? LABSPEC 1.035 (H) 07/10/2021 [id number]  ? PHURINE 5.0 07/10/2021 0834  ? GLUCOSEU NEGATIVE 07/10/2021 0834  ? GLUCOSEU NEGATIVE 08/11/2019 1332  ? HGBUR MODERATE (A) 07/10/2021 0834  ? BILIRUBINUR NEGATIVE 07/10/2021 0834  ? BILIRUBINUR neg 04/02/2019 1204  ? BILIRUBINUR Negative 05/15/2016 1629  ? KETONESUR 20 (A) 07/10/2021 [id number]  ? PROTEINUR NEGATIVE 07/10/2021 0834  ? UROBILINOGEN 0.2 08/11/2019 1332  ? NITRITE POSITIVE (A) 07/10/2021 0834  ? LEUKOCYTESUR TRACE (A) 07/10/2021 0834  ? ? ?Lab Results  ?Component Value Date  ? LABMICR See below: 05/15/2016  ? WBCUA 11-30 (A) 05/15/2016  ? RBCUA 0-2 05/15/2016  ? LABEPIT >10 (A) 05/15/2016  ? MUCUS Present 05/15/2016  ? BACTERIA RARE (A) 07/10/2021  ? ? ?Pertinent Imaging: ?Renal US 08/21/2021: Images reviewed and discussed with the  patient ?No results found for this or any previous visit. ? ?No results found for this or any previous visit. ? ?No results found for this or any previous visit. ? ?No results found for this or any previous visit. ? ?Results for orders placed during the hospital encounter of 08/21/21 ? ?Ultrasound renal complete ? ?Narrative ?CLINICAL DATA:  Follow-up examination for nephrolithiasis. ? ?EXAM: ?RENAL / URINARY TRACT ULTRASOUND COMPLETE ? ?COMPARISON:  Prior CT from 07/10/2021. ? ?FINDINGS: ?Right Kidney: ? ?Renal measurements: 11.5 x 4.9 x 4.7 cm = volume: 137.5 mL. Renal ?echogenicity within normal limits. Mild focal cortical thinning ?noted at the interpolar region. Previously seen hydronephrosis has ?resolved. No visible nephrolithiasis. No focal renal mass. ? ?Left Kidney: ? ?Renal measurements: 12.2 x 4.7 x 4.9 cm = volume: 146.5 mL. Renal ?echogenicity within normal limits. No nephrolithiasis or ?hydronephrosis. No focal renal mass. ? ?Bladder: ? ?Bladder poorly distended and not well evaluated. No obvious ?abnormality. ? ?Other: ? ?None. ? ?IMPRESSION: ?Interval resolution of previously seen right-sided hydronephrosis. ?No  nephrolithiasis or obstructive uropathy now seen about either ?kidney. ? ? ?Electronically Signed ?By: Rise Mu M.D. ?On: 08/22/2021 05:31 ? ?No results found for this or any previous visit. ? ?No results found for [facility]

## 2021-09-27 ENCOUNTER — Ambulatory Visit (INDEPENDENT_AMBULATORY_CARE_PROVIDER_SITE_OTHER): Payer: 59 | Admitting: Adult Health

## 2021-09-27 ENCOUNTER — Encounter: Payer: Self-pay | Admitting: Adult Health

## 2021-09-27 VITALS — BP 129/92 | HR 72 | Ht 63.0 in | Wt 227.0 lb

## 2021-09-27 DIAGNOSIS — Z01411 Encounter for gynecological examination (general) (routine) with abnormal findings: Secondary | ICD-10-CM

## 2021-09-27 DIAGNOSIS — N83291 Other ovarian cyst, right side: Secondary | ICD-10-CM | POA: Diagnosis not present

## 2021-09-27 DIAGNOSIS — R102 Pelvic and perineal pain: Secondary | ICD-10-CM | POA: Diagnosis not present

## 2021-09-27 DIAGNOSIS — N941 Unspecified dyspareunia: Secondary | ICD-10-CM | POA: Diagnosis not present

## 2021-09-27 DIAGNOSIS — Z01419 Encounter for gynecological examination (general) (routine) without abnormal findings: Secondary | ICD-10-CM

## 2021-09-27 NOTE — Progress Notes (Signed)
Patient ID: Audrey [name], female   DOB: [birth year], 36 y.o.   MRN: [id number] ?History of Present Illness: ?Audrey [name] is a 36 year old white female,married, [id number], in for a well woman gyn exam. She had sleeve surgery in December and has lost 35+lbs for total of 70 lbs  and she feels better. She had kidney stone 07/10/21 and had CT showing complex cyst right ovary, 3.1 cm. She has pain with sex and pelvic pressure at times.  ?She had stent and is on allopurinol now.  ?Lab Results  ?Component Value Date  ? DIAGPAP  08/10/2020  ?  - Negative for intraepithelial lesion or malignancy (NILM)  ? HPVHIGH Negative 08/10/2020  ? PCP Dr. Clent Ridges. ? ?Current Medications, Allergies, Past Medical History, Past Surgical History, Family History and Social History were reviewed in [employer] record.   ? ? ?Review of Systems: ?Patient denies any headaches, hearing loss, fatigue, blurred vision, shortness of breath, chest pain, abdominal pain, problems with bowel movements, urination. No joint pain or mood swings.  ?See HPI for positives. ? ? ?Physical Exam:BP (!) 129/92 (BP Location: Right Arm, Patient Position: Sitting, Cuff Size: Normal)   Pulse 72   Ht 5\' 3"  (1.6 m)   Wt 227 lb (103 kg)   LMP 08/31/2021   BMI 40.21 kg/m?   ?General:  Well developed, well nourished, no acute distress ?Skin:  Warm and dry,tan ?Neck:  Midline trachea, normal thyroid, good ROM, no lymphadenopathy ?Lungs; Clear to auscultation bilaterally ?Breast:  No dominant palpable mass, retraction, or nipple discharge ?Cardiovascular: Regular rate and rhythm ?Abdomen:  Soft, non tender, no hepatosplenomegaly ?Pelvic:  External genitalia is normal in appearance, no lesions.  The vagina is normal in appearance. Urethra has no lesions or masses. The cervix is bulbous.  Uterus is felt to be normal size, shape, and contour.  No adnexal masses or tenderness noted.Bladder is non tender, no masses felt. ?Extremities/musculoskeletal:  No swelling or  varicosities noted, no clubbing or cyanosis ?Psych:  No mood changes, alert and cooperative,seems happy ?AA is 0 ?Fall risk is low ? ?  09/27/2021  ?  1:55 PM 05/15/2021  ? 10:45 AM 01/30/2021  ?  3:07 PM  ?Depression screen PHQ 2/9  ?Decreased Interest 0 0 0  ?Down, Depressed, Hopeless 0 0 0  ?PHQ - 2 Score 0 0 0  ?Altered sleeping 1 0 1  ?Tired, decreased energy 0 0 1  ?Change in appetite 0 0 0  ?Feeling bad or failure about yourself  0 0 0  ?Trouble concentrating 1 0 0  ?Moving slowly or fidgety/restless 0 0 0  ?Suicidal thoughts 0 0 0  ?PHQ-9 Score 2 0 2  ?Difficult doing work/chores   Not difficult at all  ?  ? ?  09/27/2021  ?  1:55 PM 01/30/2021  ?  3:07 PM 08/10/2020  ? 10:53 AM 03/30/2020  ? 10:38 AM  ?GAD 7 : Generalized Anxiety Score  ?Nervous, Anxious, on Edge 1 1 1 3   ?Control/stop worrying 0 0 1 3  ?Worry too much - different things 0 0 1 2  ?Trouble relaxing 1 1 1 3   ?Restless 0 0 1 1  ?Easily annoyed or irritable 0 1 1 2   ?Afraid - awful might happen 0 0 1 2  ?Total GAD 7 Score 2 3 7 16   ?Anxiety Difficulty  Not difficult at all  Not difficult at all  ? ?  ? [place] - 09/27/21 1406   ? ?  ?  Pregnancy Intention Screening  ? Does the patient want to become pregnant in the next year? No   ? Does the patient's partner want to become pregnant in the next year? No   ? Would the patient like to discuss contraceptive options today? No   ?  ? Contraception Wrap Up  ? Current Method Female Condom   ? End Method Female Condom   ? ?  ?  ? ?  ?  ?Husband thinking about vasectomy. ?Examination chaperoned by Malachy Mood LPN ? ?Impression and Plan: ?1. Encounter for well woman exam with routine gynecological exam ?Physical in 1 year ?Pap 2025 ? ?2. Complex cyst of right ovary ?Pelvic US in about 4 weeks to assess ovary and uterus  ? ?3. Pelvic pressure in female ?Pelvic US in about 4 weeks to assess ovary and uterus  ?Will talk when results back ? ?4. Dyspareunia, female ? ? ? ? ?  ?  ?

## 2021-10-18 ENCOUNTER — Telehealth: Payer: Self-pay

## 2021-10-18 ENCOUNTER — Encounter (HOSPITAL_COMMUNITY): Payer: Self-pay | Admitting: Emergency Medicine

## 2021-10-18 ENCOUNTER — Emergency Department (HOSPITAL_COMMUNITY)
Admission: EM | Admit: 2021-10-18 | Discharge: 2021-10-18 | Disposition: A | Discharge status: Home / Self Care | Payer: 59 | Attending: Emergency Medicine | Admitting: Emergency Medicine

## 2021-10-18 ENCOUNTER — Other Ambulatory Visit: Payer: Self-pay

## 2021-10-18 DIAGNOSIS — N921 Excessive and frequent menstruation with irregular cycle: Secondary | ICD-10-CM | POA: Diagnosis present

## 2021-10-18 DIAGNOSIS — I10 Essential (primary) hypertension: Secondary | ICD-10-CM | POA: Insufficient documentation

## 2021-10-18 LAB — BASIC METABOLIC PANEL
Anion gap: 4 — ABNORMAL LOW (ref 5–15)
BUN: 12 mg/dL (ref 6–20)
CO2: 26 mmol/L (ref 22–32)
Calcium: 8.9 mg/dL (ref 8.9–10.3)
Chloride: 106 mmol/L (ref 98–111)
Creatinine, Ser: 0.77 mg/dL (ref 0.44–1.00)
GFR, Estimated: >60 mL/min (ref >60)
Glucose, Bld: 93 mg/dL (ref 70–99)
Potassium: 3.5 mmol/L (ref 3.5–5.1)
Sodium: 136 mmol/L (ref 135–145)

## 2021-10-18 LAB — CBC WITH DIFFERENTIAL/PLATELET
Abs Immature Granulocytes: 0.01 10*3/uL (ref 0.00–0.07)
Basophils Absolute: 0 10*3/uL (ref 0.0–0.1)
Basophils Relative: 0 %
Eosinophils Absolute: 0.1 10*3/uL (ref 0.0–0.5)
Eosinophils Relative: 2 %
HCT: 39.2 % (ref 36.0–46.0)
Hemoglobin: 12.4 g/dL (ref 12.0–15.0)
Immature Granulocytes: 0 %
Lymphocytes Relative: 23 %
Lymphs Abs: 1.1 10*3/uL (ref 0.7–4.0)
MCH: 27 pg (ref 26.0–34.0)
MCHC: 31.6 g/dL (ref 30.0–36.0)
MCV: 85.2 fL (ref 80.0–100.0)
Monocytes Absolute: 0.3 10*3/uL (ref 0.1–1.0)
Monocytes Relative: 6 %
Neutro Abs: 3.3 10*3/uL (ref 1.7–7.7)
Neutrophils Relative %: 69 %
Platelets: 333 10*3/uL (ref 150–400)
RBC: 4.6 MIL/uL (ref 3.87–5.11)
RDW: 13.4 % (ref 11.5–15.5)
WBC: 4.8 10*3/uL (ref 4.0–10.5)
nRBC: 0 % (ref 0.0–0.2)

## 2021-10-18 LAB — HCG, QUANTITATIVE, PREGNANCY: hCG, Beta Chain, Quant, S: <1 m[IU]/mL (ref <5)

## 2021-10-18 MED ORDER — SODIUM CHLORIDE 0.9 % IV BOLUS
1000.0000 mL | Freq: Once | INTRAVENOUS | Status: AC
Start: 2021-10-18 — End: 2021-10-18
  Administered 2021-10-18: 1000 mL via INTRAVENOUS

## 2021-10-18 MED ORDER — ONDANSETRON HCL 4 MG/2ML IJ SOLN
4.0000 mg | Freq: Once | INTRAMUSCULAR | Status: AC
Start: 2021-10-18 — End: 2021-10-18
  Administered 2021-10-18: 4 mg via INTRAVENOUS
  Filled 2021-10-18: qty 2

## 2021-10-18 MED ORDER — PROMETHAZINE HCL 12.5 MG PO TABS
12.5000 mg | ORAL_TABLET | Freq: Once | ORAL | Status: AC
Start: 2021-10-18 — End: 2021-10-18
  Administered 2021-10-18: 12.5 mg via ORAL
  Filled 2021-10-18: qty 1

## 2021-10-18 MED ORDER — TRANEXAMIC ACID 650 MG PO TABS: 1300.0000 mg | ORAL_TABLET | Freq: Three times a day (TID) | ORAL | 0 refills | Status: DC

## 2021-10-18 MED ORDER — ACETAMINOPHEN 500 MG PO TABS
1000.0000 mg | ORAL_TABLET | Freq: Once | ORAL | Status: AC
  Administered 2021-10-18: 1000 mg via ORAL
  Filled 2021-10-18: qty 2

## 2021-10-18 NOTE — ED Triage Notes (Signed)
Pt period started Tuesday, today started to change tampon q 30 minutes, told by GYN to come in for evaluation.

## 2021-10-18 NOTE — Telephone Encounter (Signed)
Called pt after reading Mychart msg, two identifiers used. Dr Charlotta Newton informed about pt's symptoms. Informed pt to immediately go to the ED. Asked pt if she had a ride, she stated that she was going to call her husband. Instructed the pt to call 911 if her husband could not take her in the next few min. Impressed the importance of seeking immediate emergent care. [facility] closest ED and Dr Charlotta Newton called to inform them she was coming over and her symptoms.

## 2021-10-18 NOTE — Discharge Instructions (Addendum)
Rest and make sure you are trying to maintain hydration.  Your labs today are reassuring, specifically your hemoglobin level and your labs reflect normal electrolytes and no significant dehydration.  I do recommend considering taking the medication prescribed if your heavy period persist beyond the next 1 to 2 days.  It is reasonable to hold this medication and not take it if you are heavy period naturally improves which it may.  Plan [facility] with [facility] if your symptoms persist or worsen.

## 2021-10-18 NOTE — ED Notes (Signed)
Pt's OB Provider called and said that she can be reached on her personal cell phone at [phone] if the provider has any further questions for her.

## 2021-10-19 NOTE — ED Provider Notes (Signed)
  [facility] EMERGENCY DEPARTMENT Provider Note   CSN: [id number] Arrival date & time: 10/18/21  1223     History {Add pertinent medical, surgical, social history, OB history to HPI:1} Chief Complaint  Patient presents with  . Vaginal Bleeding    Audrey [name] is a 36 y.o. female.  The history is provided by the patient.      Home Medications Prior to Admission medications   Medication Sig Start Date End Date Taking? Authorizing Provider  tranexamic acid (LYSTEDA) 650 MG TABS tablet Take 2 tablets (1,300 mg total) by mouth 3 (three) times daily. 10/18/21  Yes Cyanna Neace, Raynelle Fanning, PA-C  allopurinol (ZYLOPRIM) 300 MG tablet Take 1 tablet (300 mg total) by mouth daily. Patient not taking: Reported on 09/27/2021 09/26/21   Malen Gauze, MD  Multiple Vitamin (MULTIVITAMIN) tablet Take 1 tablet by mouth daily. Takes 2 gummies daily    [provider]      Allergies    Bee venom, Codeine, Penicillins, Percocet [oxycodone-acetaminophen], Tape, and Bactrim [sulfamethoxazole-trimethoprim]    Review of Systems   Review of Systems  Physical Exam Updated Vital Signs BP 122/79 (BP Location: Left Arm)   Pulse 80   Temp 98 F (36.7 C) (Oral)   Resp 19   LMP 10/16/2021   SpO2 100%  Physical Exam  ED Results / Procedures / Treatments   Labs (all labs ordered are listed, but only abnormal results are displayed) Labs Reviewed  BASIC METABOLIC PANEL - Abnormal; Notable for the following components:      Result Value   Anion gap 4 (*)    All other components within normal limits  CBC WITH DIFFERENTIAL/PLATELET  HCG, QUANTITATIVE, PREGNANCY    EKG None  Radiology No results found.  Procedures Procedures  {Document cardiac monitor, telemetry assessment procedure when appropriate:1}  Medications Ordered in ED Medications  sodium chloride 0.9 % bolus 1,000 mL (0 mLs Intravenous Stopped 10/18/21 1705)  ondansetron (ZOFRAN) injection 4 mg (4 mg Intravenous Given 10/18/21  1606)  acetaminophen (TYLENOL) tablet 1,000 mg (1,000 mg Oral Given 10/18/21 1607)  promethazine (PHENERGAN) tablet 12.5 mg (12.5 mg Oral Given 10/18/21 1842)    ED Course/ Medical Decision Making/ A&P                           Medical Decision Making Amount and/or Complexity of Data Reviewed Labs: ordered.  Risk OTC drugs. Prescription drug management.   ***  {Document critical care time when appropriate:1} {Document review of labs and clinical decision tools ie heart score, Chads2Vasc2 etc:1}  {Document your independent review of radiology images, and any outside records:1} {Document your discussion with family members, caretakers, and with consultants:1} {Document social determinants of health affecting pt's care:1} {Document your decision making why or why not admission, treatments were needed:1} Final Clinical Impression(s) / ED Diagnoses Final diagnoses:  Menorrhagia with irregular cycle    Rx / DC Orders ED Discharge Orders          Ordered    tranexamic acid (LYSTEDA) 650 MG TABS tablet  3 times daily        10/18/21 1845

## 2021-10-23 ENCOUNTER — Ambulatory Visit (INDEPENDENT_AMBULATORY_CARE_PROVIDER_SITE_OTHER): Payer: 59 | Admitting: Family Medicine

## 2021-10-23 ENCOUNTER — Encounter: Payer: Self-pay | Admitting: Family Medicine

## 2021-10-23 VITALS — BP 98/76 | HR 87 | Temp 98.9°F | Wt 221.0 lb

## 2021-10-23 DIAGNOSIS — R55 Syncope and collapse: Secondary | ICD-10-CM | POA: Diagnosis not present

## 2021-10-23 DIAGNOSIS — R42 Dizziness and giddiness: Secondary | ICD-10-CM | POA: Diagnosis not present

## 2021-10-23 LAB — T4, FREE: Free T4: 0.95 ng/dL (ref 0.60–1.60)

## 2021-10-23 LAB — T3, FREE: T3, Free: 3.1 pg/mL (ref 2.3–4.2)

## 2021-10-23 LAB — TSH: TSH: 1.76 u[IU]/mL (ref 0.35–5.50)

## 2021-10-23 NOTE — Progress Notes (Signed)
   Subjective:    Patient ID: Audrey [name], female    DOB: 1986/05/13, 36 y.o.   MRN: [id number]  HPI Here for one week of feeling lightheaded and weak and of almost passing out at one point. She describes standing outside talking to her neighbor when she suddenly felt very weak like she might pass out. She was able to sit down on her porch and elevated her legs. After a few minutes she felt back to normal. This feeling comes and goes, and it is not related to exertion. She denies any chest pain or SOB or palpitations. She notes that her monthly menses are quite regular but that her past one came 2 weeks late. The lightheaded spell described above a few days before her cycle began. Her bleeding was heavier than normal, and it stopped after 6 days. She notes that her BP at home has been running very low for the past week. On the morning of her ED visit, the BP at home was 90/54. She had drank water and eaten a bag of potato chips by the time she checked into the ED, so her BP was 122/79 on arrival. Her exam was normal, as was an EKG. Her labs were also normal, including Hgb 12.4, creatinine 0.77, glucose 93, and a negative beta HCG. She was given IV fluids and sent home. Today she feels fine. She notes that her daughter was diagnosed with Hashimoto's thyroiditis at the age of 73, when she developed headaches and syncopal episodes.    Review of Systems  Constitutional: Negative.   Respiratory: Negative.    Cardiovascular: Negative.   Neurological:  Positive for light-headedness. Negative for dizziness, tremors, seizures, syncope, facial asymmetry, speech difficulty, weakness, numbness and headaches.      Objective:   Physical Exam Constitutional:      Appearance: Normal appearance. She is not ill-appearing.  Cardiovascular:     Rate and Rhythm: Normal rate and regular rhythm.     Pulses: Normal pulses.     Heart sounds: Normal heart sounds.  Pulmonary:     Effort: Pulmonary effort is normal.      Breath sounds: Normal breath sounds.  Neurological:     General: No focal deficit present.     Mental Status: She is alert and oriented to person, place, and time.          Assessment & Plan:  Recent episodes of lightheadedness and near syncope. The etiology of this is unclear. We will get a thyroid panel today to look for thyroid disorders. She could have been hypoglycemic as well. She will follow up if these spells continue. Of so, we may consider setting her up for a glucose tolerance test and a cardiac monitor. We spent a total of (33   ) minutes reviewing records and discussing these issues.  Gershon Crane, MD

## 2021-10-30 ENCOUNTER — Ambulatory Visit (HOSPITAL_BASED_OUTPATIENT_CLINIC_OR_DEPARTMENT_OTHER)
Admission: RE | Admit: 2021-10-30 | Discharge: 2021-10-30 | Disposition: A | Discharge status: Home / Self Care | Payer: 59 | Source: Ambulatory Visit | Attending: Adult Health | Admitting: Adult Health

## 2021-10-30 ENCOUNTER — Other Ambulatory Visit: Payer: 59

## 2021-10-30 DIAGNOSIS — N83291 Other ovarian cyst, right side: Secondary | ICD-10-CM | POA: Diagnosis not present

## 2021-10-30 DIAGNOSIS — R102 Pelvic and perineal pain: Secondary | ICD-10-CM | POA: Diagnosis present

## 2021-10-30 DIAGNOSIS — N941 Unspecified dyspareunia: Secondary | ICD-10-CM

## 2022-01-08 ENCOUNTER — Ambulatory Visit: Payer: 59 | Admitting: Urology

## 2022-03-02 ENCOUNTER — Ambulatory Visit
Admission: EM | Admit: 2022-03-02 | Discharge: 2022-03-02 | Disposition: A | Discharge status: Home / Self Care | Payer: Medicaid Other | Attending: Family Medicine | Admitting: Family Medicine

## 2022-03-02 ENCOUNTER — Telehealth: Payer: Self-pay | Admitting: Family Medicine

## 2022-03-02 DIAGNOSIS — H60392 Other infective otitis externa, left ear: Secondary | ICD-10-CM

## 2022-03-02 MED ORDER — DOXYCYCLINE HYCLATE 100 MG PO CAPS: 100.0000 mg | ORAL_CAPSULE | Freq: Two times a day (BID) | ORAL | 0 refills | Status: DC

## 2022-03-02 NOTE — ED Provider Notes (Signed)
[facility]    CSN: [id number] Arrival date & time: 03/02/22  9892      History   Chief Complaint Chief Complaint  Patient presents with   Ear Pain    Left ear pain and swelling. X2 days    HPI Audrey [name] is a 36 y.o. female.   Presenting today with left outer ear pain, swelling, redness for the past 2 days.  Started as what she thought might have been a pimple or a blackhead near the entrance of the ear canal and is now so swollen that she can barely hear and it is significantly painful to the touch.  Denies drainage, bleeding, fever, chills, difficulty breathing or swallowing.  Trying some Benadryl with no relief.    Past Medical History:  Diagnosis Date   Allergy    Anxiety    BV (bacterial vaginosis) 12/30/2012   Chicken pox    Chronic kidney disease    Contraceptive management 07/15/2013   Hay fever    History of kidney stones    uric acid in urine   Hypertension    Irregular bleeding 12/30/2012   Leukorrhea 10/07/2013   resolved s/p cryotherapy     Migraines    PONV (postoperative nausea and vomiting)    Postcoital bleeding 05/13/2013   Had [place] and IUD in place, bleeding stopped after doxycycline but started back, has normal period lite x 2 days but bleeds every time with sex, is bright red and has clots and cramps after sex    UTI (lower urinary tract infection) 08/24/2014   UTI (urinary tract infection)     Patient Active Problem List   Diagnosis Date Noted   Dyspareunia, female 09/27/2021   Pelvic pressure in female 09/27/2021   Complex cyst of right ovary 09/27/2021   Hydronephrosis concurrent with and due to calculi of kidney and ureter 07/10/2021   History of bariatric surgery 05/15/2021   Phlebitis of right arm 05/15/2021   Hypertension 09/22/2020   Dry skin 08/10/2020   Weight gain 08/10/2020   Encounter for gynecological examination with Papanicolaou smear of cervix 08/10/2020   Irregular periods 08/10/2020   Routine medical  exam 08/10/2020   Pregnancy examination or test, negative result 08/10/2020   Elevated BP without diagnosis of hypertension 08/10/2020   Menorrhagia with irregular cycle 05/05/2020   Bilateral leg edema 08/11/2019   Multifocal pneumonia 03/23/2019   Lymphocytopenia 03/23/2019   Lobar pneumonia ([place]) 03/23/2019   BMI 45.0-49.9, adult ([place]) 10/26/2018   Weight loss counseling, encounter for 10/26/2018   Irritable bowel syndrome with diarrhea 10/26/2018   Encounter for well woman exam with routine gynecological exam 12/09/2017   Family planning 12/09/2017   Screening examination for STD (sexually transmitted disease) 12/09/2017   Encounter for initial prescription of contraceptive pills 12/09/2017   SVD (spontaneous vaginal delivery) 01/01/2017   Pre-eclampsia 12/31/2016   Normal labor 12/31/2016   Preterm labor 12/04/2016   History of preterm delivery, currently pregnant 06/17/2016   History of cryosurgery of cervix affecting pregnancy 06/17/2016   BMI 40.0-44.9, adult ([place]) 06/15/2016   Pyelonephritis 06/15/2016   Attention deficit hyperactivity disorder (ADHD) 04/24/2015   BV (bacterial vaginosis) 12/30/2012   Anxiety 06/08/2012   Migraines 09/25/2010    Past Surgical History:  Procedure Laterality Date   CHOLECYSTECTOMY N/A 03/09/2014   Procedure: LAPAROSCOPIC CHOLECYSTECTOMY;  Surgeon: Jamesetta So, MD;  Location: [facility];  Service: General;  Laterality: N/A;   CYSTOSCOPY W/ URETERAL STENT PLACEMENT Right 07/10/2021  Procedure: CYSTOSCOPY WITH RETROGRADE PYELOGRAM/URETERAL STENT PLACEMENT, URETERAL STONE EXTRACTION;  Surgeon: Malen Gauze, MD;  Location: [facility];  Service: Urology;  Laterality: Right;   DILATION AND CURETTAGE OF UTERUS     LAPAROSCOPIC GASTRIC SLEEVE RESECTION  04/2021   TONSILLECTOMY      OB History     Gravida  5   Para  4   Term  3   Preterm  1   AB  1   Living  3      SAB  1   IAB      Ectopic      Multiple      Live  Births  3            Home Medications    Prior to Admission medications   Medication Sig Start Date End Date Taking? Authorizing Provider  doxycycline (VIBRAMYCIN) 100 MG capsule Take 1 capsule (100 mg total) by mouth 2 (two) times daily. 03/02/22  Yes Particia Nearing, PA-C  allopurinol (ZYLOPRIM) 300 MG tablet Take 1 tablet (300 mg total) by mouth daily. Patient not taking: Reported on 10/23/2021 09/26/21   Malen Gauze, MD  Multiple Vitamin (MULTIVITAMIN) tablet Take 1 tablet by mouth daily. Takes 2 gummies daily    [provider]  tranexamic acid (LYSTEDA) 650 MG TABS tablet Take 2 tablets (1,300 mg total) by mouth 3 (three) times daily. Patient not taking: Reported on 10/23/2021 10/18/21   Burgess Amor, PA-C    Family History Family History  Problem Relation Age of Onset   Arthritis Mother    Lung cancer Mother    Hyperlipidemia Mother    Atrial fibrillation Mother    Stroke Mother    Hypertension Mother    Diabetes Father    Dementia Father    Breast cancer Maternal Aunt    Diabetes Paternal Uncle     Social History Social History   Tobacco Use   Smoking status: Never   Smokeless tobacco: Never  Vaping Use   Vaping Use: Never used  Substance Use Topics   Alcohol use: No    Alcohol/week: 0.0 standard drinks of alcohol   Drug use: No     Allergies   Bee venom, Codeine, Penicillins, Percocet [oxycodone-acetaminophen], Tape, and Bactrim [sulfamethoxazole-trimethoprim]   Review of Systems Review of Systems Per HPI  Physical Exam Triage Vital Signs ED Triage Vitals  Enc Vitals Group     BP 03/02/22 1020 107/75     Pulse Rate 03/02/22 1020 85     Resp 03/02/22 1020 20     Temp 03/02/22 1020 97.9 F (36.6 C)     Temp Source 03/02/22 1020 Oral     SpO2 03/02/22 1020 99 %     Weight 03/02/22 1019 190 lb (86.2 kg)     Height 03/02/22 1019 5\' 4"  (1.626 m)     Head Circumference --      Peak Flow --      Pain Score 03/02/22 1019 7      Pain Loc --      Pain Edu? --      Excl. in GC? --    No data found.  Updated Vital Signs BP 107/75 (BP Location: Right Arm)   Pulse 85   Temp 97.9 F (36.6 C) (Oral)   Resp 20   Ht 5\' 4"  (1.626 m)   Wt 190 lb (86.2 kg)   LMP 03/01/2022   SpO2 99%   BMI 32.61  kg/m   Visual Acuity Right Eye Distance:   Left Eye Distance:   Bilateral Distance:    Right Eye Near:   Left Eye Near:    Bilateral Near:     Physical Exam Vitals and nursing note reviewed.  Constitutional:      Appearance: Normal appearance. She is not ill-appearing.  HENT:     Head: Atraumatic.     Ears:     Comments: Left external ear leading into the EAC erythematous, edematous, significantly tender to palpation.  Unable to fully pass otoscope speculum to visualize TM due to swelling and discomfort.  No active drainage or bleeding    Nose: Nose normal.     Mouth/Throat:     Mouth: Mucous membranes are moist.  Eyes:     Extraocular Movements: Extraocular movements intact.     Conjunctiva/sclera: Conjunctivae normal.  Cardiovascular:     Rate and Rhythm: Normal rate and regular rhythm.     Heart sounds: Normal heart sounds.  Pulmonary:     Effort: Pulmonary effort is normal.     Breath sounds: Normal breath sounds.  Musculoskeletal:        General: Normal range of motion.     Cervical back: Normal range of motion and neck supple.  Skin:    General: Skin is warm and dry.  Neurological:     Mental Status: She is alert and oriented to person, place, and time.  Psychiatric:        Mood and Affect: Mood normal.        Thought Content: Thought content normal.        Judgment: Judgment normal.      UC Treatments / Results  Labs (all labs ordered are listed, but only abnormal results are displayed) Labs Reviewed - No data to display  EKG   Radiology No results found.  Procedures Procedures (including critical care time)  Medications Ordered in UC Medications - No data to display  Initial  Impression / Assessment and Plan / UC Course  I have reviewed the triage vital signs and the nursing notes.  Pertinent labs & imaging results that were available during my care of the patient were reviewed by me and considered in my medical decision making (see chart for details).     Treat with doxycycline for otitis externa, discussed warm compresses, over-the-counter pain relievers and return precautions  Final Clinical Impressions(s) / UC Diagnoses   Final diagnoses:  Other infective acute otitis externa of left ear   Discharge Instructions   None    ED Prescriptions     Medication Sig Dispense Auth. Provider   doxycycline (VIBRAMYCIN) 100 MG capsule Take 1 capsule (100 mg total) by mouth 2 (two) times daily. 14 capsule Particia Nearing, [place]      PDMP not reviewed this encounter.   Particia Nearing, [place] 03/02/22 1326

## 2022-03-02 NOTE — ED Triage Notes (Signed)
Pt states that she has some left ear pain and swelling. X2 days

## 2022-03-02 NOTE — Telephone Encounter (Signed)
Rx transferred.

## 2022-06-08 ENCOUNTER — Other Ambulatory Visit: Payer: Self-pay

## 2022-06-08 ENCOUNTER — Emergency Department (HOSPITAL_COMMUNITY): Payer: Self-pay

## 2022-06-08 ENCOUNTER — Emergency Department (HOSPITAL_COMMUNITY)
Admission: EM | Admit: 2022-06-08 | Discharge: 2022-06-08 | Discharge status: Against Medical Advice | Payer: Self-pay | Attending: Student | Admitting: Student

## 2022-06-08 DIAGNOSIS — R55 Syncope and collapse: Secondary | ICD-10-CM | POA: Insufficient documentation

## 2022-06-08 DIAGNOSIS — W228XXA Striking against or struck by other objects, initial encounter: Secondary | ICD-10-CM | POA: Insufficient documentation

## 2022-06-08 DIAGNOSIS — I10 Essential (primary) hypertension: Secondary | ICD-10-CM | POA: Insufficient documentation

## 2022-06-08 DIAGNOSIS — Y9301 Activity, walking, marching and hiking: Secondary | ICD-10-CM | POA: Insufficient documentation

## 2022-06-08 DIAGNOSIS — S0083XA Contusion of other part of head, initial encounter: Secondary | ICD-10-CM | POA: Insufficient documentation

## 2022-06-08 DIAGNOSIS — Z5329 Procedure and treatment not carried out because of patient's decision for other reasons: Secondary | ICD-10-CM | POA: Insufficient documentation

## 2022-06-08 LAB — CBG MONITORING, ED: Glucose-Capillary: 83 mg/dL (ref 70–99)

## 2022-06-08 LAB — I-STAT BETA HCG BLOOD, ED (MC, WL, AP ONLY): I-stat hCG, quantitative: 11.3 m[IU]/mL — ABNORMAL HIGH (ref <5)

## 2022-06-08 MED ORDER — METOCLOPRAMIDE HCL 5 MG/ML IJ SOLN
10.0000 mg | Freq: Once | INTRAMUSCULAR | Status: AC
  Administered 2022-06-08: 10 mg via INTRAVENOUS
  Filled 2022-06-08: qty 2

## 2022-06-08 MED ORDER — DEXAMETHASONE SODIUM PHOSPHATE 4 MG/ML IJ SOLN
4.0000 mg | Freq: Once | INTRAMUSCULAR | Status: AC
  Administered 2022-06-08: 4 mg via INTRAVENOUS
  Filled 2022-06-08: qty 1

## 2022-06-08 MED ORDER — KETOROLAC TROMETHAMINE 15 MG/ML IJ SOLN
15.0000 mg | Freq: Once | INTRAMUSCULAR | Status: AC
  Administered 2022-06-08: 15 mg via INTRAVENOUS
  Filled 2022-06-08: qty 1

## 2022-06-08 NOTE — ED Provider Notes (Signed)
Assume Care - Medical Decision Making  Care of patient assumed from previous emergency medicine provider. See their note for further details of history, physical exam and plan.  Briefly, Audrey [name] is a 37 y.o. female who presents with:  Clinical Course as of 06/08/22 1717  Sat Jun 08, 2022  1556 Htn, anxiety Syncope- walking to car doesn't remember anything Frontal hematoma, no lac Axo x4, slow to answer q's, amnestic to event Migraine x 4 days- meds w/ relief Nonfocal exam, nfd Syncope workup- no n/v/f/c Trauma workup- head and c spine CT Ambulatory now, declining labs and imaging, wants to leave [ED]    Clinical Course User Index [ED] Phyllis Ginger, MD      Reassessment:  I personally reassessed the patient: Vital Signs:  The most current vitals were  Vitals:   06/08/22 1630 06/08/22 1700  BP: 106/76 121/81  Pulse: 75 67  Resp: 14 17  Temp:    SpO2: 100% 100%  Hemodynamics:  The patient is hemodynamically stable. Mental Status:  The patient is alert   Additional MDM/ED Course: Was contacted by the nurse that this patient was requesting to leave AMA.  Had discussion with her involving risks, benefits, alternatives.  She ultimately accepts the risks of leaving AMA, but feels so much better right now than when she came in, and does not want to get the CT scans and the labs.  The patient understands that the hospital has offered any or all of the following: o   To examine the patient to determine whether or not an emergency medical condition is present. o   To provide stabilizing medical treatment for an emergency condition. o   To provide a medically appropriate transfer to another healthcare facility. o   To administer ongoing (inpatient) medical care.   The patient refuses the offered services against medical advice and the refusal may result in a worsening condition including chronic disability or death.  She states she is aware of this.   The patient is welcome  to return at any time and chooses to refuse the offered services.  Encouraged her to treat her frontal hematoma with ice as well as Tylenol and ibuprofen, and that she should follow-up with her primary care doctor if she does not want to return to the emergency department.     Phyllis Ginger, MD 06/08/22 Luan Pulling, MD 06/09/22 [phone]

## 2022-06-08 NOTE — ED Provider Notes (Cosign Needed)
[facility] Provider Note   CSN: [id number] Arrival date & time: 06/08/22  1338     History  Chief Complaint  Patient presents with   Loss of Consciousness    Audrey [name] is a 37 y.o. female with past medical history anxiety, migraine headaches, hypertension, history of gastric sleeve (though the patient states she is currently not taking any prescription medications and is regularly followed by PCP at least once a year) presents to the ED complaining of a syncopal episode just prior to arrival.  Patient reports that she remembers walking outside to her car and the next thing she remembers she woke up on the ground next to her car with a hematoma to the left forehead.  Patient was found within a few minutes and was alerted at that time though confused and did not know where she was or what was going on.  Syncopal episode was unwitnessed and patient unable to clarify length of time that she was out of consciousness.  She reports that for the last 4 days she has had a really bad headache especially to the left side of her forehead that has been associated with photophobia, phonophobia, and nausea.  She has a very remote history of migraine headaches when she was younger but has not had issues with these in recent years.  She states since the fall she has a little bit of pain in her neck but mostly pain is localized to her head especially around the site of the hematoma.  She denies prior history of syncopal episodes.  She denies previous cardiac history.  She denies vomiting, diarrhea, fever, chills, leg pain or swelling, focal weakness, abdominal pain, chest pain, or shortness of breath.  She states that she "feels weird all over."    Home Medications Prior to Admission medications   Medication Sig Start Date End Date Taking? Authorizing Provider  allopurinol (ZYLOPRIM) 300 MG tablet Take 1 tablet (300 mg total) by mouth daily. Patient not taking:  Reported on 10/23/2021 09/26/21   Cleon Gustin, MD  doxycycline (VIBRAMYCIN) 100 MG capsule Take 1 capsule (100 mg total) by mouth 2 (two) times daily. 03/02/22   Volney American, PA-C  Multiple Vitamin (MULTIVITAMIN) tablet Take 1 tablet by mouth daily. Takes 2 gummies daily    [provider]  tranexamic acid (LYSTEDA) 650 MG TABS tablet Take 2 tablets (1,300 mg total) by mouth 3 (three) times daily. Patient not taking: Reported on 10/23/2021 10/18/21   Evalee Jefferson, PA-C      Allergies    Bee venom, Codeine, Penicillins, Percocet [oxycodone-acetaminophen], Tape, and Bactrim [sulfamethoxazole-trimethoprim]    Review of Systems   Review of Systems  Constitutional:  Negative for activity change, appetite change, chills, fatigue and fever.  HENT:  Negative for congestion, ear pain, rhinorrhea, sinus pressure, sinus pain, sore throat and trouble swallowing.   Eyes:  Positive for photophobia. Negative for pain.  Respiratory:  Negative for apnea, cough, choking, chest tightness, shortness of breath and wheezing.   Cardiovascular:  Negative for chest pain, palpitations and leg swelling.  Gastrointestinal:  Positive for nausea. Negative for abdominal distention, abdominal pain, blood in stool, constipation, diarrhea and vomiting.  Genitourinary:  Negative for difficulty urinating, dysuria and hematuria.  Musculoskeletal:  Positive for neck pain. Negative for arthralgias, back pain and neck stiffness.  Skin:  Negative for color change and rash.  Neurological:  Positive for syncope and headaches. Negative for seizures.  All other  systems reviewed and are negative.   Physical Exam Updated Vital Signs BP (!) 145/87   Pulse 69   Temp 98.5 F (36.9 C)   Resp 14   Ht 5\' 5"  (1.651 m)   Wt 86.2 kg   LMP 05/27/2022   SpO2 100%   BMI 31.62 kg/m  Physical Exam Vitals and nursing note reviewed.  Constitutional:      General: She is not in acute distress.    Appearance: Normal  appearance. She is not ill-appearing, toxic-appearing or diaphoretic.  HENT:     Head: Normocephalic.     Comments: ~3cm round hematoma to left forehead with localized tenderness, no laceration/abrasion/open wound    Mouth/Throat:     Mouth: Mucous membranes are moist.  Eyes:     General: No scleral icterus.    Extraocular Movements: Extraocular movements intact.     Conjunctiva/sclera: Conjunctivae normal.     Pupils: Pupils are equal, round, and reactive to light.  Cardiovascular:     Rate and Rhythm: Normal rate and regular rhythm.     Heart sounds: Normal heart sounds. No murmur heard. Pulmonary:     Effort: Pulmonary effort is normal.     Breath sounds: Normal breath sounds.  Chest:     Chest wall: No tenderness.  Abdominal:     General: Abdomen is flat. There is no distension.     Palpations: Abdomen is soft.     Tenderness: There is no abdominal tenderness. There is no guarding or rebound.  Musculoskeletal:        General: No tenderness (to bilateral lower extremities) or deformity. Normal range of motion.     Cervical back: Normal range of motion and neck supple. No rigidity or tenderness.     Right lower leg: No edema.     Left lower leg: No edema.  Skin:    General: Skin is warm and dry.     Capillary Refill: Capillary refill takes less than 2 seconds.  Neurological:     General: No focal deficit present.     Mental Status: She is alert and oriented to person, place, and time.     Cranial Nerves: No cranial nerve deficit.     Motor: No weakness.     Gait: Gait normal.     Comments: A bit slow to answer questions but does so appropriately  Psychiatric:        Mood and Affect: Mood normal.        Behavior: Behavior normal.        Thought Content: Thought content normal.        Judgment: Judgment normal.     ED Results / Procedures / Treatments   Labs (all labs ordered are listed, but only abnormal results are displayed) Labs Reviewed  I-STAT BETA HCG BLOOD,  ED ([facility], [facility], [facility] ONLY) - Abnormal; Notable for the following components:      Result Value   I-stat hCG, quantitative 11.3 (*)    All other components within normal limits  BASIC METABOLIC PANEL  CBC WITH DIFFERENTIAL/PLATELET  URINALYSIS, ROUTINE W REFLEX MICROSCOPIC  CBG MONITORING, ED    EKG None  Radiology No results found.  Procedures Procedures   Medications Ordered in ED Medications  ketorolac (TORADOL) 15 MG/ML injection 15 mg (15 mg Intravenous Given 06/08/22 1501)  metoCLOPramide (REGLAN) injection 10 mg (10 mg Intravenous Given 06/08/22 1502)  dexamethasone (DECADRON) injection 4 mg (4 mg Intravenous Given 06/08/22 1502)    ED  Course/ Medical Decision Making/ A&P                             Medical Decision Making Amount and/or Complexity of Data Reviewed Labs: ordered. Decision-making details documented in ED Course. Radiology: ordered. Decision-making details documented in ED Course. ECG/medicine tests: ordered. Decision-making details documented in ED Course.   This is a 37 year old female presenting to the ED post syncopal episode with noted head injury on exam of hematoma to left forehead.  Patient has had migraine type headache for the last 4 days.  Differential includes but is not limited to syncope and collapse of unclear etiology, dysrhythmia, ACS,  CVA/TIA, intracranial hemorrhage, migraine headache, dehydration, electrolyte disturbance, vasovagal syncope, orthostatic hypotension, PE, syncope of psychogenic etiology.  With normal exam and unremarkable vital signs (mildly elevated BP), very low suspicion for cardiac or neurological etiology but will proceed with syncope workup as ordered and rule out further traumatic injury.  She has remote history of migraine headaches but has not had one in several years.  She states that she is on no chronic medications.  She has had no previous syncopal episodes and denies any cardiac history.  She arrives via EMS and received  Zofran en route with improvement but not relief of some associated nausea.  On my initial interview and exam, patient is neurologically intact, alert and oriented, no focal deficits, though is a bit slow to answer questions and has obvious signs of head trauma on exam.  Syncopal episode was unwitnessed and length of time of unconsciousness is unknown though husband reports bystanders found patient within 2 to 3 minutes period of when she was last seen.  On reassessment, patient's headache is improved following the medications given above.  She remains neurologically intact, c-collar has been removed as she has no cervical spine tenderness, and she is now up and ambulatory to the restroom.  Her vital signs remained stable.  She is pending workup though I suspect that this will likely be negative and she will be safe to be discharged home.  Care signed out to oncoming resident physician, Gust Brooms, pending this workup and further disposition.          Final Clinical Impression(s) / ED Diagnoses Final diagnoses:  Syncope and collapse  Traumatic hematoma of forehead, initial encounter    Rx / DC Orders ED Discharge Orders     None         Tonette Lederer, PA-C 06/08/22 1527

## 2022-06-08 NOTE — ED Triage Notes (Signed)
Going to her car had a syncopal episode stuck her head on the concrete beside her car pain to the left side of head and neck.  Ems placed in c-collar alert oriented at this time. Placed on O2 because for ems they state she could not remember the date of her children birthday and doesn't remember the event happening.    BP 122/86 CBG 120 GCS 14 HR 86 O2 100 RR 16

## 2022-06-08 NOTE — Discharge Instructions (Addendum)
Audrey [name]:  Thank you for allowing [place] to take care of you today.  We hope you begin feeling better soon. You were seen today for an episode of collapsing and fainting.  Ultimately, your headache is feeling better now and you wanted to go home.  While this is against our medical advice and we recommend that you stay for lab work and imaging, it is in your right to want to leave.  If anything should happen again or your symptoms get worse, please return to the emergency department and we will be happy to evaluate you.  Otherwise, treat your injuries with icing, Tylenol, and ibuprofen and please follow-up with your primary doctor within the next couple of days.  To-Do:  Please follow-up with your primary doctor within the next 2-3 days. It is important that you review any labs or imaging results (if any) that you had today with them. Your preliminary imaging results (if any) are attached. Please return to the Emergency Department or call 911 if you experience chest pain, shortness of breath, severe pain, severe fever, altered mental status, or have any reason to think that you need emergency medical care.  Thank you again.  Hope you feel better soon.  Phyllis Ginger, MD Department of Emergency Medicine

## 2022-06-17 ENCOUNTER — Inpatient Hospital Stay: Payer: Medicaid Other | Admitting: Family Medicine

## 2022-06-21 DIAGNOSIS — Z411 Encounter for cosmetic surgery: Secondary | ICD-10-CM | POA: Diagnosis not present

## 2023-01-05 DIAGNOSIS — R319 Hematuria, unspecified: Secondary | ICD-10-CM | POA: Diagnosis not present

## 2023-01-05 DIAGNOSIS — R1010 Upper abdominal pain, unspecified: Secondary | ICD-10-CM | POA: Diagnosis not present

## 2023-01-05 DIAGNOSIS — R109 Unspecified abdominal pain: Secondary | ICD-10-CM | POA: Diagnosis not present

## 2023-01-05 DIAGNOSIS — N390 Urinary tract infection, site not specified: Secondary | ICD-10-CM | POA: Diagnosis not present

## 2023-01-05 DIAGNOSIS — R300 Dysuria: Secondary | ICD-10-CM | POA: Diagnosis not present

## 2023-01-05 DIAGNOSIS — K6389 Other specified diseases of intestine: Secondary | ICD-10-CM | POA: Diagnosis not present

## 2023-01-05 DIAGNOSIS — D649 Anemia, unspecified: Secondary | ICD-10-CM | POA: Diagnosis not present

## 2023-01-06 DIAGNOSIS — K6389 Other specified diseases of intestine: Secondary | ICD-10-CM | POA: Diagnosis not present

## 2023-01-13 ENCOUNTER — Encounter: Payer: Self-pay | Admitting: Family Medicine

## 2023-01-13 NOTE — Telephone Encounter (Signed)
Call in Diflucan 150 mg #1 pill with 5 refills

## 2023-01-14 MED ORDER — FLUCONAZOLE 150 MG PO TABS: 150.0000 mg | ORAL_TABLET | Freq: Once | ORAL | 5 refills | Status: AC

## 2023-01-20 ENCOUNTER — Encounter: Payer: Self-pay | Admitting: Family Medicine

## 2023-01-20 ENCOUNTER — Ambulatory Visit (INDEPENDENT_AMBULATORY_CARE_PROVIDER_SITE_OTHER): Payer: BC Managed Care – PPO | Admitting: Family Medicine

## 2023-01-20 VITALS — BP 102/72 | HR 88 | Temp 98.8°F | Wt 198.0 lb

## 2023-01-20 DIAGNOSIS — R109 Unspecified abdominal pain: Secondary | ICD-10-CM | POA: Diagnosis not present

## 2023-01-20 DIAGNOSIS — D649 Anemia, unspecified: Secondary | ICD-10-CM | POA: Diagnosis not present

## 2023-01-20 DIAGNOSIS — N390 Urinary tract infection, site not specified: Secondary | ICD-10-CM | POA: Diagnosis not present

## 2023-01-20 LAB — IBC + FERRITIN
Ferritin: 3.3 ng/mL — ABNORMAL LOW (ref 10.0–291.0)
Iron: 16 ug/dL — ABNORMAL LOW (ref 42–145)
Saturation Ratios: 3 % — ABNORMAL LOW (ref 20.0–50.0)
TIBC: 530.6 ug/dL — ABNORMAL HIGH (ref 250.0–450.0)
Transferrin: 379 mg/dL — ABNORMAL HIGH (ref 212.0–360.0)

## 2023-01-20 LAB — CBC WITH DIFFERENTIAL/PLATELET
Basophils Absolute: 0 10*3/uL (ref 0.0–0.1)
Basophils Relative: 0.6 % (ref 0.0–3.0)
Eosinophils Absolute: 0.1 10*3/uL (ref 0.0–0.7)
Eosinophils Relative: 2.3 % (ref 0.0–5.0)
HCT: 30.7 % — ABNORMAL LOW (ref 36.0–46.0)
Hemoglobin: 9.3 g/dL — ABNORMAL LOW (ref 12.0–15.0)
Lymphocytes Relative: 27.7 % (ref 12.0–46.0)
Lymphs Abs: 1.4 10*3/uL (ref 0.7–4.0)
MCHC: 30.3 g/dL (ref 30.0–36.0)
MCV: 66.1 fl — ABNORMAL LOW (ref 78.0–100.0)
Monocytes Absolute: 0.5 10*3/uL (ref 0.1–1.0)
Monocytes Relative: 9.5 % (ref 3.0–12.0)
Neutro Abs: 3.1 10*3/uL (ref 1.4–7.7)
Neutrophils Relative %: 59.9 % (ref 43.0–77.0)
Platelets: 372 10*3/uL (ref 150.0–400.0)
RBC: 4.64 Mil/uL (ref 3.87–5.11)
RDW: 16.2 % — ABNORMAL HIGH (ref 11.5–15.5)
WBC: 5.1 10*3/uL (ref 4.0–10.5)

## 2023-01-20 LAB — VITAMIN B12: Vitamin B-12: 160 pg/mL — ABNORMAL LOW (ref 211–911)

## 2023-01-20 LAB — FOLATE: Folate: 7.9 ng/mL (ref >5.9)

## 2023-01-20 MED ORDER — FLUCONAZOLE 150 MG PO TABS: 150.0000 mg | ORAL_TABLET | Freq: Every day | ORAL | 0 refills | Status: DC

## 2023-01-20 MED ORDER — CEPHALEXIN 500 MG PO CAPS: 500.0000 mg | ORAL_CAPSULE | Freq: Three times a day (TID) | ORAL | 0 refills | Status: DC

## 2023-01-20 NOTE — Progress Notes (Signed)
   Subjective:    Patient ID: Audrey [name], female    DOB: [birth year], 37 y.o.   MRN: [id number]  HPI Here to follow up on an ED visit in [facility] on 01-05-23. She presented with several days of intermittent right flank pains and nausea without vomiting. No fever. No urinary urgency or burning. Her BM were normal. Her urine was positive for a UTI, and she was treated with 7 days of Cipro. A CT scan was negative for kidney stones. Her lbas showed a normal WBC but the Hgb was low at 8.5. Her last Hgb here in may 2023 was 12.4. She says her menses are heavy for the first day, but they they are light. After taking the Cipro she felt better for a week, but now the right flank pain has returned. She drinks plenty of water.    Review of Systems  Constitutional: Negative.   Respiratory: Negative.    Cardiovascular: Negative.   Gastrointestinal: Negative.   Genitourinary:  Positive for flank pain. Negative for dysuria, frequency, hematuria and urgency.  Musculoskeletal:  Positive for back pain.       Objective:   Physical Exam Constitutional:      Appearance: Normal appearance. She is not ill-appearing.  Cardiovascular:     Rate and Rhythm: Normal rate and regular rhythm.     Pulses: Normal pulses.     Heart sounds: Normal heart sounds.  Pulmonary:     Effort: Pulmonary effort is normal.     Breath sounds: Normal breath sounds.  Abdominal:     General: Abdomen is flat. Bowel sounds are normal. There is no distension.     Palpations: Abdomen is soft. There is no mass.     Tenderness: There is right CVA tenderness. There is no left CVA tenderness, guarding or rebound.     Hernia: No hernia is present.     Comments: Mildly tender in the right flank   Neurological:     Mental Status: She is alert.           Assessment & Plan:  She seems to have a partially treated UTI, so we will give her 10 days of Cephalexin. For th anemia, we will check a CBC, folate, B12, iron and  ferritin today. She has been taking OTC iron pills BID since the ED visit.  Gershon Crane, MD

## 2023-01-22 NOTE — Addendum Note (Signed)
Addended by: Johnella Moloney on: 01/22/2023 04:41 PM   Modules accepted: Orders

## 2023-01-24 DIAGNOSIS — Z903 Acquired absence of stomach [part of]: Secondary | ICD-10-CM | POA: Diagnosis not present

## 2023-01-24 DIAGNOSIS — K912 Postsurgical malabsorption, not elsewhere classified: Secondary | ICD-10-CM | POA: Diagnosis not present

## 2023-02-03 ENCOUNTER — Ambulatory Visit: Payer: BC Managed Care – PPO | Admitting: Family Medicine

## 2023-02-03 ENCOUNTER — Encounter: Payer: Self-pay | Admitting: Family Medicine

## 2023-02-03 VITALS — BP 106/70 | HR 80 | Temp 98.6°F | Wt 201.0 lb

## 2023-02-03 DIAGNOSIS — F419 Anxiety disorder, unspecified: Secondary | ICD-10-CM | POA: Diagnosis not present

## 2023-02-03 MED ORDER — LORAZEPAM 0.5 MG PO TABS: 0.5000 mg | ORAL_TABLET | Freq: Two times a day (BID) | ORAL | 2 refills | Status: AC | PRN

## 2023-02-03 NOTE — Progress Notes (Signed)
   Subjective:    Patient ID: Audrey [name], female    DOB: 1985-07-04, 37 y.o.   MRN: [id number]  HPI Here asking for help with anxiety. She has been under a lot of stress the past few months, and part of this is due to her iron deficiency. This makes her feel tired all the time, and it is hard to sleep because her muscles aches. Her Hgb on 01-24-23 was 8.7. she is seeing [facility], and she is scheduled for her first iron infusion later this week. She describes feeling anxious all the time, and then at times she suddenly feels extremely anxious to the point that she feels SOB and her heart is pounding. This happens about 2-3 times a day. She has tried Buspar, Lexapro, and Celexa in the padt, but these all made her feel flat emotionally.    Review of Systems  Constitutional:  Positive for fatigue.  Respiratory: Negative.    Cardiovascular: Negative.   Psychiatric/Behavioral:  Positive for sleep disturbance. Negative for agitation, behavioral problems, confusion, decreased concentration, dysphoric mood and hallucinations. The patient is nervous/anxious.        Objective:   Physical Exam Constitutional:      Appearance: Normal appearance. She is not ill-appearing.  Cardiovascular:     Rate and Rhythm: Normal rate and regular rhythm.     Pulses: Normal pulses.     Heart sounds: Normal heart sounds.  Pulmonary:     Effort: Pulmonary effort is normal.     Breath sounds: Normal breath sounds.  Neurological:     Mental Status: She is alert.  Psychiatric:        Mood and Affect: Mood normal.        Behavior: Behavior normal.        Thought Content: Thought content normal.           Assessment & Plan:  Anxiety. She will try Lorazepam 0.5 mg as needed. Report back in 3-4 weeks. Gershon Crane, MD

## 2023-02-07 DIAGNOSIS — Z903 Acquired absence of stomach [part of]: Secondary | ICD-10-CM | POA: Diagnosis not present

## 2023-02-07 DIAGNOSIS — D508 Other iron deficiency anemias: Secondary | ICD-10-CM | POA: Diagnosis not present

## 2023-02-07 DIAGNOSIS — K9589 Other complications of other bariatric procedure: Secondary | ICD-10-CM | POA: Diagnosis not present

## 2023-02-11 DIAGNOSIS — D508 Other iron deficiency anemias: Secondary | ICD-10-CM | POA: Diagnosis not present

## 2023-02-12 ENCOUNTER — Other Ambulatory Visit: Payer: Self-pay

## 2023-02-12 ENCOUNTER — Emergency Department (HOSPITAL_COMMUNITY)
Admission: EM | Admit: 2023-02-12 | Discharge: 2023-02-12 | Disposition: A | Discharge status: Home / Self Care | Payer: BC Managed Care – PPO | Attending: Emergency Medicine | Admitting: Emergency Medicine

## 2023-02-12 ENCOUNTER — Encounter (HOSPITAL_COMMUNITY): Payer: Self-pay | Admitting: *Deleted

## 2023-02-12 ENCOUNTER — Emergency Department (HOSPITAL_COMMUNITY): Payer: BC Managed Care – PPO

## 2023-02-12 DIAGNOSIS — R079 Chest pain, unspecified: Secondary | ICD-10-CM | POA: Diagnosis not present

## 2023-02-12 DIAGNOSIS — R202 Paresthesia of skin: Secondary | ICD-10-CM | POA: Diagnosis not present

## 2023-02-12 DIAGNOSIS — R0602 Shortness of breath: Secondary | ICD-10-CM | POA: Diagnosis not present

## 2023-02-12 LAB — BASIC METABOLIC PANEL WITH GFR
Anion gap: 12 (ref 5–15)
BUN: 14 mg/dL (ref 6–20)
CO2: 21 mmol/L — ABNORMAL LOW (ref 22–32)
Calcium: 9.7 mg/dL (ref 8.9–10.3)
Chloride: 106 mmol/L (ref 98–111)
Creatinine, Ser: 0.95 mg/dL (ref 0.44–1.00)
GFR, Estimated: >60 mL/min (ref >60)
Glucose, Bld: 106 mg/dL — ABNORMAL HIGH (ref 70–99)
Potassium: 3.3 mmol/L — ABNORMAL LOW (ref 3.5–5.1)
Sodium: 139 mmol/L (ref 135–145)

## 2023-02-12 LAB — CBC
HCT: 33.6 % — ABNORMAL LOW (ref 36.0–46.0)
Hemoglobin: 9.8 g/dL — ABNORMAL LOW (ref 12.0–15.0)
MCH: 20.3 pg — ABNORMAL LOW (ref 26.0–34.0)
MCHC: 29.2 g/dL — ABNORMAL LOW (ref 30.0–36.0)
MCV: 69.7 fL — ABNORMAL LOW (ref 80.0–100.0)
Platelets: 487 10*3/uL — ABNORMAL HIGH (ref 150–400)
RBC: 4.82 MIL/uL (ref 3.87–5.11)
RDW: 15.9 % — ABNORMAL HIGH (ref 11.5–15.5)
WBC: 8.4 10*3/uL (ref 4.0–10.5)
nRBC: 0 % (ref 0.0–0.2)

## 2023-02-12 LAB — TROPONIN I (HIGH SENSITIVITY)
Troponin I (High Sensitivity): <2 ng/L (ref <18)
Troponin I (High Sensitivity): <2 ng/L (ref <18)

## 2023-02-12 MED ORDER — DIPHENHYDRAMINE HCL 50 MG/ML IJ SOLN
12.5000 mg | Freq: Once | INTRAMUSCULAR | Status: AC
  Administered 2023-02-12: 12.5 mg via INTRAVENOUS
  Filled 2023-02-12: qty 1

## 2023-02-12 MED ORDER — DIPHENHYDRAMINE HCL 25 MG PO TABS
25.0000 mg | ORAL_TABLET | Freq: Three times a day (TID) | ORAL | 0 refills | Status: AC | PRN
Start: 2023-02-12 — End: ?

## 2023-02-12 MED ORDER — LORAZEPAM 2 MG/ML IJ SOLN
0.5000 mg | Freq: Once | INTRAMUSCULAR | Status: AC
  Administered 2023-02-12: 0.5 mg via INTRAVENOUS
  Filled 2023-02-12: qty 1

## 2023-02-12 MED ORDER — FAMOTIDINE IN NACL 20-0.9 MG/50ML-% IV SOLN
20.0000 mg | Freq: Once | INTRAVENOUS | Status: AC
  Administered 2023-02-12: 20 mg via INTRAVENOUS
  Filled 2023-02-12: qty 50

## 2023-02-12 MED ORDER — DEXAMETHASONE SODIUM PHOSPHATE 10 MG/ML IJ SOLN
10.0000 mg | Freq: Once | INTRAMUSCULAR | Status: AC
  Administered 2023-02-12: 10 mg via INTRAVENOUS
  Filled 2023-02-12: qty 1

## 2023-02-12 MED ORDER — FAMOTIDINE 20 MG PO TABS: 20.0000 mg | ORAL_TABLET | Freq: Two times a day (BID) | ORAL | 0 refills | Status: DC

## 2023-02-12 MED ORDER — PREDNISONE 10 MG PO TABS: ORAL_TABLET | ORAL | 0 refills | Status: DC

## 2023-02-12 NOTE — ED Triage Notes (Signed)
Pt with c/o SOB, pt states she had an iron infusion yesterday for anemia. Started today while at rest. Dull mid CP.

## 2023-02-12 NOTE — Discharge Instructions (Signed)
Your lab test today are all reassuring, there is no evidence that today symptoms were related to any cardiac event and your chest x-ray is clear.  Your hemoglobin today is 9.8 for your records, this appears a little bit better than your last hemoglobin level.  I want you to complete a 7-day course of prednisone along with Pepcid and Benadryl to ensure that the symptoms do not return, if this was a true iron infusion reaction your symptoms could return once the medicines we gave you here wear off, therefore I do want you to taper down as prescribed.  Plan follow-up care with your primary doctor as needed, returning here if you have any return or worsening of your symptoms.

## 2023-02-12 NOTE — ED Provider Notes (Signed)
[place] EMERGENCY DEPARTMENT AT [facility] Provider Note   CSN: [id number] Arrival date & time: 02/12/23  1545     History  Chief Complaint  Patient presents with   Shortness of Breath    Audrey [name] is a 37 y.o. female who has a history of iron deficiency anemia who underwent her first iron transfusion yesterday presenting today for shortness of breath and a dull mid chest pain which started while at rest at home.  She is concerned about this being a possible side effect or reaction to her infusion yesterday.  She denies fevers or chills, nausea or vomiting, she denies rash.  She denies palpitations, nausea or vomiting, diaphoresis, abdominal pain back pain or headache.  She does report development of tingling in her fingertips and around her mouth with today's episode.  The history is provided by the patient.       Home Medications Prior to Admission medications   Medication Sig Start Date End Date Taking? Authorizing Provider  Cholecalciferol 1.25 MG (50000 UT) capsule Take 1 capsule by mouth once a week. 01/29/23  Yes [provider]  Cyanocobalamin (B-12 PO) Take 1 tablet by mouth daily at 12 noon.   Yes [provider]  diphenhydrAMINE (BENADRYL) 25 MG tablet Take 1 tablet (25 mg total) by mouth every 8 (eight) hours as needed for allergies. 02/12/23  Yes Yosiah Jasmin, Raynelle Fanning, PA-C  famotidine (PEPCID) 20 MG tablet Take 1 tablet (20 mg total) by mouth 2 (two) times daily. 02/12/23  Yes Janisa Labus, Raynelle Fanning, PA-C  FEROSUL 325 (65 Fe) MG tablet Take 325 mg by mouth 2 (two) times daily. 01/06/23  Yes [provider]  LORazepam (ATIVAN) 0.5 MG tablet Take 1 tablet (0.5 mg total) by mouth 2 (two) times daily as needed for anxiety. 02/03/23  Yes Nelwyn Salisbury, MD  ondansetron (ZOFRAN-ODT) 4 MG disintegrating tablet Take 4 mg by mouth every 6 (six) hours as needed. 01/06/23  Yes [provider]  predniSONE (DELTASONE) 10 MG tablet 6, 5, 4, 3, 2 then 1  tablet by mouth daily for 6 days total. 02/12/23  Yes Tradarius Reinwald, Raynelle Fanning, PA-C      Allergies    Bee venom, Oxycodone-acetaminophen, Penicillins, Sulfamethoxazole-trimethoprim, Tape, Wound dressing adhesive, Codeine, and Metoclopramide    Review of Systems   Review of Systems  Constitutional:  Negative for chills and fever.  HENT: Negative.    Eyes: Negative.   Respiratory:  Positive for chest tightness and shortness of breath.   Cardiovascular:  Negative for chest pain and palpitations.  Gastrointestinal:  Negative for abdominal pain, nausea and vomiting.  Genitourinary: Negative.   Musculoskeletal:  Negative for arthralgias, joint swelling and neck pain.  Skin: Negative.  Negative for rash and wound.  Neurological:  Negative for dizziness, weakness, light-headedness, numbness and headaches.  Psychiatric/Behavioral: Negative.      Physical Exam Updated Vital Signs BP 107/77   Pulse 76   Temp 98.1 F (36.7 C) (Oral)   Resp 14   Ht 5\' 5"  (1.651 m)   Wt 77.1 kg   LMP 01/24/2023 (Approximate)   SpO2 98%   BMI 28.29 kg/m  Physical Exam Vitals and nursing note reviewed.  Constitutional:      Appearance: She is well-developed.  HENT:     Head: Normocephalic and atraumatic.     Mouth/Throat:     Pharynx: No pharyngeal swelling.  Eyes:     Conjunctiva/sclera: Conjunctivae normal.  Cardiovascular:     Rate and  Rhythm: Normal rate and regular rhythm.     Heart sounds: Normal heart sounds.  Pulmonary:     Effort: Pulmonary effort is normal. No respiratory distress.     Breath sounds: Normal breath sounds. No stridor. No decreased breath sounds, wheezing, rhonchi or rales.  Musculoskeletal:     Cervical back: Normal range of motion.  Skin:    General: Skin is warm and dry.     Findings: No erythema or rash.  Neurological:     General: No focal deficit present.     Mental Status: She is alert.     ED Results / Procedures / Treatments   Labs (all labs ordered are listed, but  only abnormal results are displayed) Labs Reviewed  BASIC METABOLIC PANEL - Abnormal; Notable for the following components:      Result Value   Potassium 3.3 (*)    CO2 21 (*)    Glucose, Bld 106 (*)    All other components within normal limits  CBC - Abnormal; Notable for the following components:   Hemoglobin 9.8 (*)    HCT 33.6 (*)    MCV 69.7 (*)    MCH 20.3 (*)    MCHC 29.2 (*)    RDW 15.9 (*)    Platelets 487 (*)    All other components within normal limits  TROPONIN I (HIGH SENSITIVITY)  TROPONIN I (HIGH SENSITIVITY)    EKG EKG Interpretation Date/Time:  Wednesday February 12 2023 15:53:46 EDT Ventricular Rate:  92 PR Interval:  138 QRS Duration:  82 QT Interval:  550 QTC Calculation: 680 R Axis:   79  Text Interpretation: Interpretation limited secondary to artifact Sinus rhythm with marked sinus arrhythmia Prolonged QT Abnormal ECG When compared with ECG of 08-Jun-2022 13:49, PREVIOUS ECG IS PRESENT Confirmed by Derwood Kaplan ([id number]) on 02/12/2023 5:50:05 PM  Radiology DG Chest 2 View  Result Date: 02/12/2023 CLINICAL DATA:  CP/SOB EXAM: CHEST - 2 VIEW COMPARISON:  October 2020 FINDINGS: The cardiomediastinal silhouette is within normal limits. No pleural effusion. No pneumothorax. No mass or consolidation. No acute osseous abnormality. IMPRESSION: Normal chest radiograph. Electronically Signed   By: Olive Bass M.D.   On: 02/12/2023 16:40    Procedures Procedures    Medications Ordered in ED Medications  famotidine (PEPCID) IVPB 20 mg premix (0 mg Intravenous Stopped 02/12/23 1940)  diphenhydrAMINE (BENADRYL) injection 12.5 mg (12.5 mg Intravenous Given 02/12/23 1803)  dexamethasone (DECADRON) injection 10 mg (10 mg Intravenous Given 02/12/23 1802)  LORazepam (ATIVAN) injection 0.5 mg (0.5 mg Intravenous Given 02/12/23 1836)    ED Course/ Medical Decision Making/ A&P                                 Medical Decision Making Patient presenting with  shortness of breath and chest tightness, concern for possible reaction of an iron infusion which she underwent yesterday.  This would be somewhat of a delayed reaction but not impossible, she had a normal respiratory and cardiac exam during my evaluation, however her initial vital signs in triage reflected a mild tachycardia and tachypnea.  With the perioral and fingertip tingling, suggest hyperventilation syndrome/anxiety reaction.  However she was given medications including Solu-Medrol, Benadryl and Pepcid per IV if indeed this is a reaction to the iron infusion.  She states she had these medicines yesterday to prior to the infusion.  She became very anxious after the Solu-Medrol was given,  Ativan was added after which her anxiety improved.  She was observed in the department and she had no worsening symptoms while here.  She will be prescribed prednisone taper along with additional Pepcid and Benadryl.  Return precautions for return here and/or follow-up with her provider of the iron infusion was recommended as needed if symptoms return or worsen.  Amount and/or Complexity of Data Reviewed Labs: ordered.    Details: Labs reviewed, negative troponin, this is not ACS, her be met shows a mild hypokalemia at 3.3 her hemoglobin is 9.8 which is her baseline. Radiology: ordered.    Details: Chest x-ray reviewed and I agree with interpretation, no acute cardiopulmonary findings. ECG/medicine tests: ordered.    Details: Rate 92, sinus arrhythmia, prolonged QT unchanged from prior.  Risk OTC drugs. Prescription drug management.           Final Clinical Impression(s) / ED Diagnoses Final diagnoses:  SOB (shortness of breath)    Rx / DC Orders ED Discharge Orders          Ordered    predniSONE (DELTASONE) 10 MG tablet        02/12/23 2159    famotidine (PEPCID) 20 MG tablet  2 times daily        02/12/23 2159    diphenhydrAMINE (BENADRYL) 25 MG tablet  Every 8 hours PRN        02/12/23  2159              Burgess Amor, Cordelia Poche 02/13/23 Theressa Millard, MD 02/17/23 1827

## 2023-02-12 NOTE — ED Notes (Signed)
Pt states she feels better.

## 2023-02-12 NOTE — ED Notes (Signed)
Pt states she is ready to go home. Edp notified.

## 2023-02-12 NOTE — ED Notes (Signed)
Pt did not seems to tolerate IV decadron well, states she "just felt weird, after you flushed with the saline" PA-C made aware

## 2023-02-18 DIAGNOSIS — D508 Other iron deficiency anemias: Secondary | ICD-10-CM | POA: Diagnosis not present

## 2023-02-18 DIAGNOSIS — Z903 Acquired absence of stomach [part of]: Secondary | ICD-10-CM | POA: Diagnosis not present

## 2023-03-16 ENCOUNTER — Encounter: Payer: Self-pay | Admitting: Family Medicine

## 2023-03-19 ENCOUNTER — Telehealth: Payer: Self-pay | Admitting: Family Medicine

## 2023-03-19 NOTE — Telephone Encounter (Signed)
Make her an OV today so we can get a sample to culture

## 2023-03-20 ENCOUNTER — Encounter: Payer: Self-pay | Admitting: Family Medicine

## 2023-03-20 ENCOUNTER — Ambulatory Visit: Payer: BC Managed Care – PPO | Admitting: Family Medicine

## 2023-03-20 VITALS — BP 110/78 | HR 87 | Temp 98.4°F | Wt 205.0 lb

## 2023-03-20 DIAGNOSIS — N390 Urinary tract infection, site not specified: Secondary | ICD-10-CM | POA: Diagnosis not present

## 2023-03-20 DIAGNOSIS — R300 Dysuria: Secondary | ICD-10-CM

## 2023-03-20 LAB — POC URINALSYSI DIPSTICK (AUTOMATED)
Glucose, UA: NEGATIVE
Ketones, UA: NEGATIVE
Nitrite, UA: POSITIVE
Protein, UA: POSITIVE — AB
Spec Grav, UA: 1.025 (ref 1.010–1.025)
Urobilinogen, UA: 1 U/dL
pH, UA: 5 (ref 5.0–8.0)

## 2023-03-20 MED ORDER — FLUCONAZOLE 150 MG PO TABS: 150.0000 mg | ORAL_TABLET | Freq: Every day | ORAL | 5 refills | Status: DC

## 2023-03-20 MED ORDER — CIPROFLOXACIN HCL 500 MG PO TABS: 500.0000 mg | ORAL_TABLET | Freq: Two times a day (BID) | ORAL | 0 refills | Status: DC

## 2023-03-20 NOTE — Progress Notes (Signed)
Subjective:    Patient ID: [name], female    DOB: Jan 12, 1986, 37 y.o.   MRN: [id number]  HPI Here for 5 days of urinary urgency and frequency. She has had some burning that is relieved by taking AZO. She has right flank pain but no fever. Drinking lots of water.   Review of Systems  Constitutional: Negative.   Respiratory: Negative.    Cardiovascular: Negative.   Gastrointestinal: Negative.   Genitourinary:  Positive for dysuria, flank pain, frequency and urgency. Negative for hematuria.       Objective:   Physical Exam Constitutional:      Appearance: Normal appearance.  Cardiovascular:     Rate and Rhythm: Normal rate and regular rhythm.     Pulses: Normal pulses.     Heart sounds: Normal heart sounds.  Pulmonary:     Effort: Pulmonary effort is normal.     Breath sounds: Normal breath sounds.  Abdominal:     Tenderness: There is no right CVA tenderness or left CVA tenderness.  Neurological:     Mental Status: She is alert.           Assessment & Plan:  UTI, treat with 7 days of Cipro. Culture the sample.  Gershon Crane, MD

## 2023-03-20 NOTE — Telephone Encounter (Signed)
Pt had an OV with Dr Clent Ridges regarding this problem

## 2023-03-20 NOTE — Telephone Encounter (Signed)
error 

## 2023-03-21 LAB — URINE CULTURE
MICRO NUMBER:: 15638696
Result:: NO GROWTH
SPECIMEN QUALITY:: ADEQUATE

## 2023-03-28 ENCOUNTER — Other Ambulatory Visit (INDEPENDENT_AMBULATORY_CARE_PROVIDER_SITE_OTHER): Payer: BC Managed Care – PPO

## 2023-03-28 DIAGNOSIS — D649 Anemia, unspecified: Secondary | ICD-10-CM

## 2023-03-28 LAB — CBC WITH DIFFERENTIAL/PLATELET
Basophils Absolute: 0 10*3/uL (ref 0.0–0.1)
Basophils Relative: 0.7 % (ref 0.0–3.0)
Eosinophils Absolute: 0.1 10*3/uL (ref 0.0–0.7)
Eosinophils Relative: 1.9 % (ref 0.0–5.0)
HCT: 40.5 % (ref 36.0–46.0)
Hemoglobin: 12.9 g/dL (ref 12.0–15.0)
Lymphocytes Relative: 24.2 % (ref 12.0–46.0)
Lymphs Abs: 1.2 10*3/uL (ref 0.7–4.0)
MCHC: 31.8 g/dL (ref 30.0–36.0)
MCV: 81.2 fL (ref 78.0–100.0)
Monocytes Absolute: 0.3 10*3/uL (ref 0.1–1.0)
Monocytes Relative: 6.2 % (ref 3.0–12.0)
Neutro Abs: 3.3 10*3/uL (ref 1.4–7.7)
Neutrophils Relative %: 67 % (ref 43.0–77.0)
Platelets: 366 10*3/uL (ref 150.0–400.0)
RBC: 4.98 Mil/uL (ref 3.87–5.11)
RDW: 27 % — ABNORMAL HIGH (ref 11.5–15.5)
WBC: 4.9 10*3/uL (ref 4.0–10.5)

## 2023-03-28 LAB — FOLATE: Folate: 9.2 ng/mL (ref >5.9)

## 2023-03-28 LAB — VITAMIN B12: Vitamin B-12: 318 pg/mL (ref 211–911)

## 2023-03-28 LAB — IBC + FERRITIN
Ferritin: 36.6 ng/mL (ref 10.0–291.0)
Iron: 85 ug/dL (ref 42–145)
Saturation Ratios: 25 % (ref 20.0–50.0)
TIBC: 340.2 ug/dL (ref 250.0–450.0)
Transferrin: 243 mg/dL (ref 212.0–360.0)

## 2023-06-29 ENCOUNTER — Ambulatory Visit
Admission: RE | Admit: 2023-06-29 | Discharge: 2023-06-29 | Disposition: A | Discharge status: Home / Self Care | Payer: Managed Care, Other (non HMO) | Source: Ambulatory Visit | Attending: Family Medicine | Admitting: Family Medicine

## 2023-06-29 VITALS — BP 125/87 | HR 87 | Temp 98.8°F | Resp 16

## 2023-06-29 DIAGNOSIS — N6310 Unspecified lump in the right breast, unspecified quadrant: Secondary | ICD-10-CM

## 2023-06-29 NOTE — ED Triage Notes (Signed)
Pt reports a breast lump (right breast on top) x 1 mo, states last week she noticed the lump got larger and started causing pain.

## 2023-06-29 NOTE — ED Provider Notes (Signed)
[facility]    CSN: [id number] Arrival date & time: 06/29/23  1221      History   Chief Complaint Chief Complaint  Patient presents with   Breast Problem    Entered by patient    HPI Audrey [name] is a 38 y.o. female.   The history is provided by the patient.   Patient presents for complaints of a lump in the right breast has been present for the past month.  Patient states symptoms started in December, she states at that time, the area was approximately "a size of a pea."  She states over the past month, she has noticed that the area has gotten larger.  She states that she can feel a "throbbing" sensation to the area, and also states that she has "itching" around the area.  Patient states that she also feels that the color of the skin around her areola has also changed.  She denies fever, chills, redness, warmth to palpation, bruising, or drainage from the site.  Patient was concerned as she states her mother had died from breast cancer, and that her aunt also has breast cancer.  Patient states that she did try to reach out to her PCP office and gynecology office for an appointment.  She states she was able to get an appointment for March.  Past Medical History:  Diagnosis Date   Allergy    Anxiety    BV (bacterial vaginosis) 12/30/2012   Chicken pox    Chronic kidney disease    Contraceptive management 07/15/2013   Hay fever    History of kidney stones    uric acid in urine   Hypertension    Irregular bleeding 12/30/2012   Leukorrhea 10/07/2013   resolved s/p cryotherapy     Migraines    PONV (postoperative nausea and vomiting)    Postcoital bleeding 05/13/2013   Had [place] and IUD in place, bleeding stopped after doxycycline but started back, has normal period lite x 2 days but bleeds every time with sex, is bright red and has clots and cramps after sex    UTI (lower urinary tract infection) 08/24/2014   UTI (urinary tract infection)     Patient Active  Problem List   Diagnosis Date Noted   Dyspareunia, female 09/27/2021   Pelvic pressure in female 09/27/2021   Complex cyst of right ovary 09/27/2021   Hydronephrosis concurrent with and due to calculi of kidney and ureter 07/10/2021   History of bariatric surgery 05/15/2021   Phlebitis of right arm 05/15/2021   Hypertension 09/22/2020   Dry skin 08/10/2020   Weight gain 08/10/2020   Encounter for gynecological examination with Papanicolaou smear of cervix 08/10/2020   Irregular periods 08/10/2020   Routine medical exam 08/10/2020   Pregnancy examination or test, negative result 08/10/2020   Elevated BP without diagnosis of hypertension 08/10/2020   Menorrhagia with irregular cycle 05/05/2020   Bilateral leg edema 08/11/2019   Multifocal pneumonia 03/23/2019   Lymphocytopenia 03/23/2019   Lobar pneumonia (HCC) 03/23/2019   BMI 45.0-49.9, adult (HCC) 10/26/2018   Weight loss counseling, encounter for 10/26/2018   Irritable bowel syndrome with diarrhea 10/26/2018   Encounter for well woman exam with routine gynecological exam 12/09/2017   Family planning 12/09/2017   Screening examination for STD (sexually transmitted disease) 12/09/2017   Encounter for initial prescription of contraceptive pills 12/09/2017   SVD (spontaneous vaginal delivery) 01/01/2017   Pre-eclampsia 12/31/2016   Normal labor 12/31/2016   Preterm labor  12/04/2016   History of preterm delivery, currently pregnant 06/17/2016   History of cryosurgery of cervix affecting pregnancy 06/17/2016   BMI 40.0-44.9, adult (HCC) 06/15/2016   Pyelonephritis 06/15/2016   Attention deficit hyperactivity disorder (ADHD) 04/24/2015   BV (bacterial vaginosis) 12/30/2012   Anxiety 06/08/2012   Migraines 09/25/2010    Past Surgical History:  Procedure Laterality Date   CHOLECYSTECTOMY N/A 03/09/2014   Procedure: LAPAROSCOPIC CHOLECYSTECTOMY;  Surgeon: Dalia Heading, MD;  Location: [facility];  Service: General;  Laterality: N/A;    CYSTOSCOPY W/ URETERAL STENT PLACEMENT Right 07/10/2021   Procedure: CYSTOSCOPY WITH RETROGRADE PYELOGRAM/URETERAL STENT PLACEMENT, URETERAL STONE EXTRACTION;  Surgeon: Malen Gauze, MD;  Location: [facility];  Service: Urology;  Laterality: Right;   DILATION AND CURETTAGE OF UTERUS     LAPAROSCOPIC GASTRIC SLEEVE RESECTION  04/2021   TONSILLECTOMY      OB History     Gravida  5   Para  4   Term  3   Preterm  1   AB  1   Living  3      SAB  1   IAB      Ectopic      Multiple      Live Births  3            Home Medications    Prior to Admission medications   Medication Sig Start Date End Date Taking? Authorizing Provider  Cholecalciferol 1.25 MG (50000 UT) capsule Take 1 capsule by mouth once a week. 01/29/23   [provider]  ciprofloxacin (CIPRO) 500 MG tablet Take 1 tablet (500 mg total) by mouth 2 (two) times daily. 03/20/23   Nelwyn Salisbury, MD  Cyanocobalamin (B-12 PO) Take 1 tablet by mouth daily at 12 noon.    [provider]  diphenhydrAMINE (BENADRYL) 25 MG tablet Take 1 tablet (25 mg total) by mouth every 8 (eight) hours as needed for allergies. 02/12/23   Burgess Amor, PA-C  famotidine (PEPCID) 20 MG tablet Take 1 tablet (20 mg total) by mouth 2 (two) times daily. 02/12/23   Burgess Amor, PA-C  FEROSUL 325 (65 Fe) MG tablet Take 325 mg by mouth 2 (two) times daily. 01/06/23   [provider]  fluconazole (DIFLUCAN) 150 MG tablet Take 1 tablet (150 mg total) by mouth daily. 03/20/23   Nelwyn Salisbury, MD  LORazepam (ATIVAN) 0.5 MG tablet Take 1 tablet (0.5 mg total) by mouth 2 (two) times daily as needed for anxiety. 02/03/23   Nelwyn Salisbury, MD  ondansetron (ZOFRAN-ODT) 4 MG disintegrating tablet Take 4 mg by mouth every 6 (six) hours as needed. 01/06/23   [provider]  predniSONE (DELTASONE) 10 MG tablet 6, 5, 4, 3, 2 then 1 tablet by mouth daily for 6 days total. 02/12/23   Burgess Amor, PA-C    Family  History Family History  Problem Relation Age of Onset   Arthritis Mother    Lung cancer Mother    Hyperlipidemia Mother    Atrial fibrillation Mother    Stroke Mother    Hypertension Mother    Diabetes Father    Dementia Father    Breast cancer Maternal Aunt    Diabetes Paternal Uncle     Social History Social History   Tobacco Use   Smoking status: Never   Smokeless tobacco: Never  Vaping Use   Vaping status: Never Used  Substance Use Topics   Alcohol use: No  Alcohol/week: 0.0 standard drinks of alcohol   Drug use: No     Allergies   Bee venom, Oxycodone-acetaminophen, Penicillins, Sulfamethoxazole-trimethoprim, Tape, Wound dressing adhesive, Codeine, and Metoclopramide   Review of Systems Review of Systems Per HPI  Physical Exam Triage Vital Signs ED Triage Vitals  Encounter Vitals Group     BP 06/29/23 1246 125/87     Systolic BP Percentile --      Diastolic BP Percentile --      Pulse Rate 06/29/23 1246 87     Resp 06/29/23 1246 16     Temp 06/29/23 1246 98.8 F (37.1 C)     Temp Source 06/29/23 1246 Oral     SpO2 06/29/23 1246 99 %     Weight --      Height --      Head Circumference --      Peak Flow --      Pain Score 06/29/23 1247 3     Pain Loc --      Pain Education --      Exclude from Growth Chart --    No data found.  Updated Vital Signs BP 125/87 (BP Location: Right Arm)   Pulse 87   Temp 98.8 F (37.1 C) (Oral)   Resp 16   LMP 06/13/2023 (Exact Date)   SpO2 99%   Visual Acuity Right Eye Distance:   Left Eye Distance:   Bilateral Distance:    Right Eye Near:   Left Eye Near:    Bilateral Near:     Physical Exam Vitals and nursing note reviewed.  Constitutional:      General: She is not in acute distress.    Appearance: Normal appearance.  HENT:     Head: Normocephalic.  Eyes:     Extraocular Movements: Extraocular movements intact.     Conjunctiva/sclera: Conjunctivae normal.     Pupils: Pupils are equal,  round, and reactive to light.  Chest:     Chest wall: No mass, deformity, swelling or tenderness.  Breasts:    Right: Mass and skin change present. No swelling, bleeding, inverted nipple, nipple discharge or tenderness.    Abdominal:     General: Bowel sounds are normal.     Palpations: Abdomen is soft.  Skin:    General: Skin is warm and dry.  Neurological:     General: No focal deficit present.     Mental Status: She is alert and oriented to person, place, and time.  Psychiatric:        Mood and Affect: Mood normal.        Behavior: Behavior normal.      UC Treatments / Results  Labs (all labs ordered are listed, but only abnormal results are displayed) Labs Reviewed - No data to display  EKG   Radiology No results found.  Procedures Procedures (including critical care time)  Medications Ordered in UC Medications - No data to display  Initial Impression / Assessment and Plan / UC Course  I have reviewed the triage vital signs and the nursing notes.  Pertinent labs & imaging results that were available during my care of the patient were reviewed by me and considered in my medical decision making (see chart for details).  Patient with firm induration noted to the right breast.  Area has changed over time, within the past month.  Patient is concerned given her maternal familial history of breast cancer.  Advised patient that it is recommended that she be  seen by her PCP or her gynecologist for further imaging.  Message was sent to patient's gynecologist, Cyril Mourning, regarding patient's symptoms.  Patient also advised to follow-up with their office on 06/30/2023.  Patient was in agreement with this plan of care and verbalizes understanding.  All questions were answered.  Patient stable for discharge.   Final Clinical Impressions(s) / UC Diagnoses   Final diagnoses:  None   Discharge Instructions   None    ED Prescriptions   None    PDMP not reviewed this  encounter.   Abran Cantor, NP 06/29/23 1640

## 2023-06-29 NOTE — Discharge Instructions (Signed)
I would like for you to follow-up with gynecology as soon as possible for an appointment to determine if further imaging is needed.  I have sent a message to your provider regarding your current symptoms.  If you are unable to get an appointment with your gynecologist, please call your PCP as soon as possible for an appointment.  Follow-up as needed.

## 2023-07-01 ENCOUNTER — Ambulatory Visit (INDEPENDENT_AMBULATORY_CARE_PROVIDER_SITE_OTHER): Payer: Medicaid Other | Admitting: Adult Health

## 2023-07-01 ENCOUNTER — Encounter: Payer: Self-pay | Admitting: Adult Health

## 2023-07-01 VITALS — BP 116/80 | HR 80 | Ht 65.0 in | Wt 215.0 lb

## 2023-07-01 DIAGNOSIS — R42 Dizziness and giddiness: Secondary | ICD-10-CM

## 2023-07-01 DIAGNOSIS — N6315 Unspecified lump in the right breast, overlapping quadrants: Secondary | ICD-10-CM | POA: Diagnosis not present

## 2023-07-01 DIAGNOSIS — R5383 Other fatigue: Secondary | ICD-10-CM | POA: Insufficient documentation

## 2023-07-01 DIAGNOSIS — Z9889 Other specified postprocedural states: Secondary | ICD-10-CM | POA: Diagnosis not present

## 2023-07-01 DIAGNOSIS — Z13220 Encounter for screening for lipoid disorders: Secondary | ICD-10-CM

## 2023-07-01 NOTE — Progress Notes (Signed)
  Subjective:     Patient ID: Audrey [name], female   DOB: [birth year], 38 y.o.   MRN: [id number]  HPI Audrey [name] is a 38 year old white female,married, [id number], in complaining of right breast lump, found about 2 weeks ago, is tender. She had breat lift about 14 months ago.    Component Value Date/Time   DIAGPAP  08/10/2020 1059    - Negative for intraepithelial lesion or malignancy (NILM)   HPVHIGH Negative 08/10/2020 1059   ADEQPAP  08/10/2020 1059    Satisfactory for evaluation; transformation zone component ABSENT.   PCP is Dr Johnny  Review of Systems +right breast lump, tender noticed about 2 weeks ago +tired, and dizzy at times, she requests labs,hx iron  infusion Reviewed past medical,surgical, social and family history. Reviewed medications and allergies.     Objective:   Physical Exam BP 116/80 (BP Location: Left Arm, Patient Position: Sitting, Cuff Size: Normal)   Pulse 80   Ht 5' 5 (1.651 m)   Wt 215 lb (97.5 kg)   LMP 06/13/2023 (Exact Date)   BMI 35.78 kg/m      Skin warm and dry,  Breasts:no dominate palpable mass, retraction or nipple discharge on the left, on the right, no retraction or nipple discharge, has pea sized mass at 12 0'clock in areola and has 2 cm mass at 9 0'clock that is tender. Sp breast lift about 14 months ago.  Upstream - 07/01/23 1022       Pregnancy Intention Screening   Does the patient want to become pregnant in the next year? No    Does the patient's partner want to become pregnant in the next year? No    Would the patient like to discuss contraceptive options today? No      Contraception Wrap Up   Current Method Vasectomy    End Method Vasectomy    Contraception Counseling Provided No             Assessment:     1. Mass overlapping multiple quadrants of right breast (Primary) has pea sized mass at 12 0'clock in areola and has 2 cm mass at 9 0'clock that is tender. Diagnostic mammogram and Right US  scheduled for her 07/04/23 at 1;20 pm  [facility]  - US  LIMITED ULTRASOUND INCLUDING AXILLA RIGHT BREAST; Future - MM 3D DIAGNOSTIC MAMMOGRAM BILATERAL BREAST; Future  2. History of breast lift Had about 14 months ago - US  LIMITED ULTRASOUND INCLUDING AXILLA RIGHT BREAST; Future - MM 3D DIAGNOSTIC MAMMOGRAM BILATERAL BREAST; Future  3. Tired +tired, hx iron  infusion She requests labs now, has physical and pap in March  - CBC - Comprehensive metabolic panel - Iron , TIBC and Ferritin Panel - TSH + free T4  4. Dizzy +dizzy at times Will check labs  - CBC - Comprehensive metabolic panel - Iron , TIBC and Ferritin Panel - TSH + free T4  5. Screening cholesterol level - Lipid panel     Plan:     Return 07/28/33 for pap and physical as scheduled

## 2023-07-04 ENCOUNTER — Ambulatory Visit
Admission: RE | Admit: 2023-07-04 | Discharge: 2023-07-04 | Disposition: A | Discharge status: Home / Self Care | Payer: Managed Care, Other (non HMO) | Source: Ambulatory Visit | Attending: Adult Health | Admitting: Adult Health

## 2023-07-04 DIAGNOSIS — Z9889 Other specified postprocedural states: Secondary | ICD-10-CM

## 2023-07-04 DIAGNOSIS — N6315 Unspecified lump in the right breast, overlapping quadrants: Secondary | ICD-10-CM

## 2023-07-29 ENCOUNTER — Ambulatory Visit: Payer: BC Managed Care – PPO | Admitting: Adult Health

## 2023-07-29 ENCOUNTER — Encounter: Payer: Self-pay | Admitting: Adult Health

## 2023-07-29 ENCOUNTER — Other Ambulatory Visit (HOSPITAL_COMMUNITY)
Admission: RE | Admit: 2023-07-29 | Discharge: 2023-07-29 | Disposition: A | Discharge status: Home / Self Care | Source: Ambulatory Visit | Attending: Adult Health | Admitting: Adult Health

## 2023-07-29 VITALS — BP 108/72 | HR 71 | Ht 65.0 in | Wt 215.0 lb

## 2023-07-29 DIAGNOSIS — N920 Excessive and frequent menstruation with regular cycle: Secondary | ICD-10-CM

## 2023-07-29 DIAGNOSIS — Z1331 Encounter for screening for depression: Secondary | ICD-10-CM | POA: Diagnosis not present

## 2023-07-29 DIAGNOSIS — Z01419 Encounter for gynecological examination (general) (routine) without abnormal findings: Secondary | ICD-10-CM | POA: Insufficient documentation

## 2023-07-29 DIAGNOSIS — Z9889 Other specified postprocedural states: Secondary | ICD-10-CM

## 2023-07-29 DIAGNOSIS — G43101 Migraine with aura, not intractable, with status migrainosus: Secondary | ICD-10-CM

## 2023-07-29 DIAGNOSIS — N6315 Unspecified lump in the right breast, overlapping quadrants: Secondary | ICD-10-CM

## 2023-07-29 DIAGNOSIS — R5383 Other fatigue: Secondary | ICD-10-CM

## 2023-07-29 NOTE — Progress Notes (Signed)
 Patient ID: Audrey [name], female   DOB: 01-13-1986, 38 y.o.   MRN: [id number] History of Present Illness: Audrey [name] is a 38 year old white female, married, [id number] in for a well woman gyn exam and pap. She is having heavy periods, may last 8 days with spotting first 2 days and last 2 days. Wears a depends and may stay home, when heavy. Has had iron infusion in the past.  PCP is Dr Clent Ridges   Current Medications, Allergies, Past Medical History, Past Surgical History, Family History and Social History were reviewed in [employer] record.     Review of Systems: Patient denies any headaches, hearing loss, fatigue, blurred vision, shortness of breath, chest pain, abdominal pain, problems with bowel movements, urination, or intercourse. No joint pain or mood swings.  Dizzy and tired if bleeding heavy, has not had labs done yet  See HPI for positives    Physical Exam:BP 108/72 (BP Location: Left Arm, Patient Position: Sitting, Cuff Size: Normal)   Pulse 71   Ht 5\' 5"  (1.651 m)   Wt 215 lb (97.5 kg)   LMP 07/29/2023   BMI 35.78 kg/m   General:  Well developed, well nourished, no acute distress Skin:  Warm and dry Neck:  Midline trachea, normal thyroid, good ROM, no lymphadenopathy Lungs; Clear to auscultation bilaterally Breast:  No dominant palpable mass, retraction, or nipple discharge on the left, on the right, can still feel mass at 12 and 3 o'clock, had mammogram and [place] 07/04/23 and was fat necrosis from breast lift. No retraction or nipple discharge.  Cardiovascular: Regular rate and rhythm Abdomen:  Soft, non tender, no hepatosplenomegaly Pelvic:  External genitalia is normal in appearance, no lesions.  The vagina is normal in appearance,+blood. Urethra has no lesions or masses. The cervix is bulbous.Pap with HR HPV genotyping performed.  Uterus is felt to be normal size, shape, and contour.  No adnexal masses or tenderness noted.Bladder is non tender, no masses  felt. Extremities/musculoskeletal:  No swelling or varicosities noted, no clubbing or cyanosis Psych:  No mood changes, alert and cooperative,seems happy AA is 0    07/29/2023    9:44 AM 03/20/2023   10:16 AM 02/03/2023   10:02 AM  Depression screen PHQ 2/9  Decreased Interest 0 1 1  Down, Depressed, Hopeless 0 0 1  PHQ - 2 Score 0 1 2  Altered sleeping 0 1 2  Tired, decreased energy 0 1 3  Change in appetite 1 0 1  Feeling bad or failure about yourself  0 1 0  Trouble concentrating 0 0 2  Moving slowly or fidgety/restless 0 0 2  Suicidal thoughts 0 0 0  PHQ-9 Score 1 4 12   Difficult doing work/chores   Somewhat difficult       07/29/2023    9:44 AM 03/20/2023   10:16 AM 02/03/2023   10:02 AM 09/27/2021    1:55 PM  GAD 7 : Generalized Anxiety Score  Nervous, Anxious, on Edge 0 1 2 1   Control/stop worrying 0 1 1 0  Worry too much - different things 0 0 1 0  Trouble relaxing 0 0 2 1  Restless 0 0 1 0  Easily annoyed or irritable 0 1 1 0  Afraid - awful might happen 0 0 1 0  Total GAD 7 Score 0 3 9 2   Anxiety Difficulty   Somewhat difficult       Upstream - 07/29/23 1478  Pregnancy Intention Screening   Does the patient want to become pregnant in the next year? No    Does the patient's partner want to become pregnant in the next year? No    Would the patient like to discuss contraceptive options today? No      Contraception Wrap Up   Current Method Vasectomy    End Method Vasectomy    Contraception Counseling Provided No            Examination chaperoned by Malachy Mood LPN, Co exam with Marylynn Pearson NP student   Impression and plan: 1. Encounter for gynecological examination with Papanicolaou smear of cervix (Primary) Pap sent Pap in 3 years if negative  Physical in 1 year - Cytology - PAP( )  2. History of breast lift  3. Tired Get labs done today   4. Menorrhagia with regular cycle  +having heavy periods, may last 8 days with spotting  first 2 days and last 2 days. Wears a depends and may stay home, when heavy. Has had iron infusion in the past. Will get pelvic US to assess uterus and ovaries 08/20/23 in office and see me after.  - US PELVIC COMPLETE WITH TRANSVAGINAL; Future  5. Migraine with aura and with status migrainosus, not intractable  6. Mass overlapping multiple quadrants of right breast Fat necrosis seen 07/04/23 for mammogram and [place]

## 2023-07-30 LAB — COMPREHENSIVE METABOLIC PANEL
ALT: 8 IU/L (ref 0–32)
AST: 12 IU/L (ref 0–40)
Albumin: 4.5 g/dL (ref 3.9–4.9)
Alkaline Phosphatase: 76 IU/L (ref 44–121)
BUN/Creatinine Ratio: 14 (ref 9–23)
BUN: 12 mg/dL (ref 6–20)
Bilirubin Total: 0.4 mg/dL (ref 0.0–1.2)
CO2: 23 mmol/L (ref 20–29)
Calcium: 9 mg/dL (ref 8.7–10.2)
Chloride: 104 mmol/L (ref 96–106)
Creatinine, Ser: 0.84 mg/dL (ref 0.57–1.00)
Globulin, Total: 2.7 g/dL (ref 1.5–4.5)
Glucose: 89 mg/dL (ref 70–99)
Potassium: 4.4 mmol/L (ref 3.5–5.2)
Sodium: 139 mmol/L (ref 134–144)
Total Protein: 7.2 g/dL (ref 6.0–8.5)
eGFR: 92 mL/min/{1.73_m2} (ref >59)

## 2023-07-30 LAB — CBC
Hematocrit: 41.5 % (ref 34.0–46.6)
Hemoglobin: 13.8 g/dL (ref 11.1–15.9)
MCH: 29.3 pg (ref 26.6–33.0)
MCHC: 33.3 g/dL (ref 31.5–35.7)
MCV: 88 fL (ref 79–97)
Platelets: 335 10*3/uL (ref 150–450)
RBC: 4.71 x10E6/uL (ref 3.77–5.28)
RDW: 11.2 % — ABNORMAL LOW (ref 11.7–15.4)
WBC: 4.9 10*3/uL (ref 3.4–10.8)

## 2023-07-30 LAB — IRON,TIBC AND FERRITIN PANEL
Ferritin: 15 ng/mL (ref 15–150)
Iron Saturation: 17 % (ref 15–55)
Iron: 71 ug/dL (ref 27–159)
Total Iron Binding Capacity: 417 ug/dL (ref 250–450)
UIBC: 346 ug/dL (ref 131–425)

## 2023-07-30 LAB — TSH+FREE T4
Free T4: 1.14 ng/dL (ref 0.82–1.77)
TSH: 2.23 u[IU]/mL (ref 0.450–4.500)

## 2023-07-30 LAB — LIPID PANEL
Chol/HDL Ratio: 5 ratio — ABNORMAL HIGH (ref 0.0–4.4)
Cholesterol, Total: 189 mg/dL (ref 100–199)
HDL: 38 mg/dL — ABNORMAL LOW (ref >39)
LDL Chol Calc (NIH): 132 mg/dL — ABNORMAL HIGH (ref 0–99)
Triglycerides: 102 mg/dL (ref 0–149)
VLDL Cholesterol Cal: 19 mg/dL (ref 5–40)

## 2023-07-31 ENCOUNTER — Other Ambulatory Visit: Payer: Self-pay | Admitting: Adult Health

## 2023-07-31 LAB — CYTOLOGY - PAP
Comment: NEGATIVE
Diagnosis: NEGATIVE
High risk HPV: NEGATIVE

## 2023-07-31 MED ORDER — METRONIDAZOLE 500 MG PO TABS: 500.0000 mg | ORAL_TABLET | Freq: Two times a day (BID) | ORAL | 0 refills | Status: DC

## 2023-07-31 NOTE — Progress Notes (Signed)
+  BV on pap will rx flagyl, no sex or alcohol while taking  

## 2023-08-20 ENCOUNTER — Ambulatory Visit: Admitting: Adult Health

## 2023-08-20 ENCOUNTER — Ambulatory Visit

## 2023-08-20 ENCOUNTER — Encounter: Payer: Self-pay | Admitting: Adult Health

## 2023-08-20 VITALS — BP 124/82 | HR 75 | Ht 65.0 in | Wt 215.0 lb

## 2023-08-20 DIAGNOSIS — N920 Excessive and frequent menstruation with regular cycle: Secondary | ICD-10-CM | POA: Diagnosis not present

## 2023-08-20 DIAGNOSIS — G43101 Migraine with aura, not intractable, with status migrainosus: Secondary | ICD-10-CM

## 2023-08-20 MED ORDER — NORETHINDRONE 0.35 MG PO TABS: 1.0000 | ORAL_TABLET | Freq: Every day | ORAL | 11 refills | Status: DC

## 2023-08-20 NOTE — Progress Notes (Signed)
 PELVIC US TA/TV: heterogeneous anteverted uterus with linear striations,normal ovaries,ovaries appear mobile,multiple small nabothian cysts,EEC 17.6 mm (avascular)  Chaperone Tish

## 2023-08-20 NOTE — Progress Notes (Signed)
  Subjective:     Patient ID: Audrey [name], female   DOB: June 27, 1985, 38 y.o.   MRN: [id number]  HPI [name] is a 38 year old white female, married, [id number] in for review of [place] had today for menorrhagia.     Component Value Date/Time   DIAGPAP  07/29/2023 0956    - Negative for intraepithelial lesion or malignancy (NILM)   DIAGPAP  08/10/2020 1059    - Negative for intraepithelial lesion or malignancy (NILM)   HPVHIGH Negative 07/29/2023 0956   HPVHIGH Negative 08/10/2020 1059   ADEQPAP  07/29/2023 0956    Satisfactory for evaluation; transformation zone component PRESENT.   ADEQPAP  08/10/2020 1059    Satisfactory for evaluation; transformation zone component ABSENT.   PCP is Dr Clent Ridges  Review of Systems +having heavy periods, may last 8 days with spotting first 2 days and last 2 days. Wears a depends and may stay home, when heavy. Has had iron infusion in the past.  Has migraines with aura    Reviewed past medical,surgical, social and family history. Reviewed medications and allergies.  Objective:   Physical Exam BP 124/82 (BP Location: Right Arm, Patient Position: Sitting, Cuff Size: Large)   Pulse 75   Ht 5\' 5"  (1.651 m)   Wt 215 lb (97.5 kg)   LMP 07/29/2023   BMI 35.78 kg/m     Skin warm and dry.  Lungs: clear to ausculation bilaterally. Cardiovascular: regular rate and rhythm.  US showed normal ovaries and uterus heterogenous with linear strations EEC 17.6 mm no color flow, has nabothian cysts.   Upstream - 08/20/23 1054       Pregnancy Intention Screening   Does the patient want to become pregnant in the next year? No    Does the patient's partner want to become pregnant in the next year? No    Would the patient like to discuss contraceptive options today? No      Contraception Wrap Up   Current Method Vasectomy    End Method Vasectomy    Contraception Counseling Provided No              Assessment:    1. Menorrhagia with regular cycle (Primary) +having  heavy periods, may last 8 days with spotting first 2 days and last 2 days. Wears a depends and may stay home, when heavy. Has had iron infusion in the past.  Discussed IUD declines, discussed ablation and gave hand out on endometrial ablation and that would need biopsy prior to Will try POP first, rx micronor  Call me in follow up on periods Meds ordered this encounter  Medications   norethindrone (MICRONOR) 0.35 MG tablet    Sig: Take 1 tablet (0.35 mg total) by mouth daily.    Dispense:  28 tablet    Refill:  11    Supervising Provider:   Duane Lope H [[id number]]    2. Migraine with aura and with status migrainosus, not intractable     Plan:     Follow up prn

## 2023-09-22 ENCOUNTER — Encounter: Payer: Self-pay | Admitting: Family Medicine

## 2023-09-22 DIAGNOSIS — D509 Iron deficiency anemia, unspecified: Secondary | ICD-10-CM

## 2023-09-25 DIAGNOSIS — D509 Iron deficiency anemia, unspecified: Secondary | ICD-10-CM | POA: Insufficient documentation

## 2023-09-25 NOTE — Telephone Encounter (Signed)
I put in the orders. She can make a lab appt

## 2023-09-27 ENCOUNTER — Emergency Department (HOSPITAL_BASED_OUTPATIENT_CLINIC_OR_DEPARTMENT_OTHER)

## 2023-09-27 ENCOUNTER — Encounter (HOSPITAL_BASED_OUTPATIENT_CLINIC_OR_DEPARTMENT_OTHER): Payer: Self-pay | Admitting: Emergency Medicine

## 2023-09-27 ENCOUNTER — Other Ambulatory Visit: Payer: Self-pay

## 2023-09-27 ENCOUNTER — Emergency Department (HOSPITAL_BASED_OUTPATIENT_CLINIC_OR_DEPARTMENT_OTHER)
Admission: EM | Admit: 2023-09-27 | Discharge: 2023-09-27 | Disposition: A | Discharge status: Home / Self Care | Attending: Emergency Medicine | Admitting: Emergency Medicine

## 2023-09-27 DIAGNOSIS — I10 Essential (primary) hypertension: Secondary | ICD-10-CM | POA: Insufficient documentation

## 2023-09-27 DIAGNOSIS — R079 Chest pain, unspecified: Secondary | ICD-10-CM

## 2023-09-27 DIAGNOSIS — R0789 Other chest pain: Secondary | ICD-10-CM | POA: Insufficient documentation

## 2023-09-27 DIAGNOSIS — R42 Dizziness and giddiness: Secondary | ICD-10-CM | POA: Diagnosis not present

## 2023-09-27 DIAGNOSIS — R5383 Other fatigue: Secondary | ICD-10-CM | POA: Insufficient documentation

## 2023-09-27 DIAGNOSIS — J189 Pneumonia, unspecified organism: Secondary | ICD-10-CM

## 2023-09-27 LAB — CBC WITH DIFFERENTIAL/PLATELET
Abs Immature Granulocytes: 0.01 K/uL (ref 0.00–0.07)
Basophils Absolute: 0 K/uL (ref 0.0–0.1)
Basophils Relative: 0 %
Eosinophils Absolute: 0.1 K/uL (ref 0.0–0.5)
Eosinophils Relative: 2 %
HCT: 35.6 % — ABNORMAL LOW (ref 36.0–46.0)
Hemoglobin: 11.7 g/dL — ABNORMAL LOW (ref 12.0–15.0)
Immature Granulocytes: 0 %
Lymphocytes Relative: 31 %
Lymphs Abs: 1.4 K/uL (ref 0.7–4.0)
MCH: 27.2 pg (ref 26.0–34.0)
MCHC: 32.9 g/dL (ref 30.0–36.0)
MCV: 82.8 fL (ref 80.0–100.0)
Monocytes Absolute: 0.4 K/uL (ref 0.1–1.0)
Monocytes Relative: 9 %
Neutro Abs: 2.6 K/uL (ref 1.7–7.7)
Neutrophils Relative %: 58 %
Platelets: 329 K/uL (ref 150–400)
RBC: 4.3 MIL/uL (ref 3.87–5.11)
RDW: 12.2 % (ref 11.5–15.5)
WBC: 4.5 K/uL (ref 4.0–10.5)
nRBC: 0 % (ref 0.0–0.2)

## 2023-09-27 LAB — D-DIMER, QUANTITATIVE: D-Dimer, Quant: 1.18 ug{FEU}/mL — ABNORMAL HIGH (ref 0.00–0.50)

## 2023-09-27 LAB — COMPREHENSIVE METABOLIC PANEL WITH GFR
ALT: 9 U/L (ref 0–44)
AST: 13 U/L — ABNORMAL LOW (ref 15–41)
Albumin: 4.2 g/dL (ref 3.5–5.0)
Alkaline Phosphatase: 77 U/L (ref 38–126)
Anion gap: 12 (ref 5–15)
BUN: 10 mg/dL (ref 6–20)
CO2: 23 mmol/L (ref 22–32)
Calcium: 9.1 mg/dL (ref 8.9–10.3)
Chloride: 105 mmol/L (ref 98–111)
Creatinine, Ser: 0.88 mg/dL (ref 0.44–1.00)
GFR, Estimated: >60 mL/min
Glucose, Bld: 89 mg/dL (ref 70–99)
Potassium: 3.7 mmol/L (ref 3.5–5.1)
Sodium: 140 mmol/L (ref 135–145)
Total Bilirubin: 0.2 mg/dL (ref 0.0–1.2)
Total Protein: 6.6 g/dL (ref 6.5–8.1)

## 2023-09-27 LAB — TROPONIN T, HIGH SENSITIVITY
Troponin T High Sensitivity: <15 ng/L
Troponin T High Sensitivity: <15 ng/L (ref <19)

## 2023-09-27 LAB — LIPASE, BLOOD: Lipase: 27 U/L (ref 11–51)

## 2023-09-27 LAB — HCG, SERUM, QUALITATIVE: Preg, Serum: NEGATIVE

## 2023-09-27 LAB — FERRITIN: Ferritin: 9 ng/mL — ABNORMAL LOW (ref 11–307)

## 2023-09-27 MED ORDER — DOXYCYCLINE HYCLATE 100 MG PO CAPS
100.0000 mg | ORAL_CAPSULE | Freq: Two times a day (BID) | ORAL | 0 refills | Status: AC
Start: 2023-09-27 — End: 2023-10-04

## 2023-09-27 MED ORDER — ACETAMINOPHEN 325 MG PO TABS
650.0000 mg | ORAL_TABLET | Freq: Once | ORAL | Status: AC
  Administered 2023-09-27: 650 mg via ORAL
  Filled 2023-09-27: qty 2

## 2023-09-27 MED ORDER — SODIUM CHLORIDE 0.9 % IV BOLUS
1000.0000 mL | Freq: Once | INTRAVENOUS | Status: AC
  Administered 2023-09-27: 1000 mL via INTRAVENOUS

## 2023-09-27 MED ORDER — IOHEXOL 350 MG/ML SOLN
75.0000 mL | Freq: Once | INTRAVENOUS | Status: AC | PRN
  Administered 2023-09-27: 75 mL via INTRAVENOUS

## 2023-09-27 NOTE — ED Provider Notes (Addendum)
 [place] EMERGENCY DEPARTMENT AT [facility] Provider Note   CSN: [id number] Arrival date & time: 09/27/23  1326     History  Chief Complaint  Patient presents with   Chest Pain    Audrey [name] is a 38 y.o. female.  Patient here with some generalized fatigue all week some right-sided chest pain as well.  Does not seem to be exertional.  She felt pretty lightheaded today maybe had a syncopal event while sitting in bed.  She has a history of iron  deficiency anemia, anxiety migraines.  History of hypertension.  Denies any weakness numbness tingling.  No headache.  No nausea vomiting abdominal pain.  No recent surgery or travel.  She has been recently prescribed estrogen birth control but has not started it yet.  Denies any cough sputum production shortness of breath.  She has been feeling rundown.  The history is provided by the patient.       Home Medications Prior to Admission medications   Medication Sig Start Date End Date Taking? Authorizing Provider  metroNIDAZOLE  (FLAGYL ) 500 MG tablet Take 1 tablet (500 mg total) by mouth 2 (two) times daily. Patient not taking: Reported on 08/20/2023 07/31/23   Lendia Quay A, NP  Cholecalciferol 1.25 MG (50000 UT) capsule Take 1 capsule by mouth once a week. 01/29/23   [provider]  diphenhydrAMINE  (BENADRYL ) 25 MG tablet Take 1 tablet (25 mg total) by mouth every 8 (eight) hours as needed for allergies. 02/12/23   Idol, Julie, PA-C  FEROSUL 325 (65 Fe) MG tablet Take 325 mg by mouth daily with breakfast. 01/06/23   [provider]  LORazepam  (ATIVAN ) 0.5 MG tablet Take 1 tablet (0.5 mg total) by mouth 2 (two) times daily as needed for anxiety. 02/03/23   Donley Furth, MD  Multiple Vitamin (MULTIVITAMIN) tablet Take 1 tablet by mouth daily.    [provider]  norethindrone  (MICRONOR ) 0.35 MG tablet Take 1 tablet (0.35 mg total) by mouth daily. 08/20/23   Javan Messing, NP  ondansetron   (ZOFRAN -ODT) 4 MG disintegrating tablet Take 4 mg by mouth every 6 (six) hours as needed. 01/06/23   [provider]      Allergies    Bee venom, Oxycodone -acetaminophen , Penicillins, Sulfamethoxazole -trimethoprim , Tape, Wound dressing adhesive, Codeine, and Metoclopramide     Review of Systems   Review of Systems  Physical Exam Updated Vital Signs BP 120/83   Pulse 71   Temp 98.2 F (36.8 C) (Oral)   Resp 15   Wt 86.2 kg   SpO2 100%   BMI 31.62 kg/m  Physical Exam Vitals and nursing note reviewed.  Constitutional:      General: She is not in acute distress.    Appearance: She is well-developed. She is not ill-appearing.  HENT:     Head: Normocephalic and atraumatic.     Nose: Nose normal.     Mouth/Throat:     Mouth: Mucous membranes are moist.  Eyes:     Extraocular Movements: Extraocular movements intact.     Conjunctiva/sclera: Conjunctivae normal.     Pupils: Pupils are equal, round, and reactive to light.  Cardiovascular:     Rate and Rhythm: Normal rate and regular rhythm.     Pulses: Normal pulses.     Heart sounds: Normal heart sounds. No murmur heard. Pulmonary:     Effort: Pulmonary effort is normal. No respiratory distress.     Breath sounds: Normal breath sounds.  Abdominal:  General: Abdomen is flat.     Palpations: Abdomen is soft.     Tenderness: There is no abdominal tenderness.  Musculoskeletal:        General: No swelling.     Cervical back: Normal range of motion and neck supple.  Skin:    General: Skin is warm and dry.     Capillary Refill: Capillary refill takes less than 2 seconds.  Neurological:     General: No focal deficit present.     Mental Status: She is alert and oriented to person, place, and time.  Psychiatric:        Mood and Affect: Mood normal.     ED Results / Procedures / Treatments   Labs (all labs ordered are listed, but only abnormal results are displayed) Labs Reviewed  CBC WITH DIFFERENTIAL/PLATELET -  Abnormal; Notable for the following components:      Result Value   Hemoglobin 11.7 (*)    HCT 35.6 (*)    All other components within normal limits  D-DIMER, QUANTITATIVE - Abnormal; Notable for the following components:   D-Dimer, Quant 1.18 (*)    All other components within normal limits  HCG, SERUM, QUALITATIVE  COMPREHENSIVE METABOLIC PANEL WITH GFR  LIPASE, BLOOD  TROPONIN T, HIGH SENSITIVITY    EKG EKG Interpretation Date/Time:  Saturday Sep 27 2023 13:35:54 EDT Ventricular Rate:  81 PR Interval:  131 QRS Duration:  117 QT Interval:  396 QTC Calculation: 460 R Axis:   71  Text Interpretation: Sinus rhythm Confirmed by Lowery Rue [id number]) on 09/27/2023 1:41:49 PM  Radiology DG Chest Portable 1 View Result Date: 09/27/2023 CLINICAL DATA:  Chest pain. EXAM: PORTABLE CHEST 1 VIEW COMPARISON:  Chest radiograph dated 02/12/2023. FINDINGS: The heart size and mediastinal contours are within normal limits. Both lungs are clear. The visualized skeletal structures are unremarkable. IMPRESSION: No active disease. Electronically Signed   By: Angus Bark M.D.   On: 09/27/2023 14:06    Procedures Procedures    Medications Ordered in ED Medications  acetaminophen  (TYLENOL ) tablet 650 mg (has no administration in time range)  sodium chloride  0.9 % bolus 1,000 mL (1,000 mLs Intravenous New Bag/Given 09/27/23 1405)    ED Course/ Medical Decision Making/ A&P                                 Medical Decision Making Amount and/or Complexity of Data Reviewed Labs: ordered. Radiology: ordered.  Risk OTC drugs.   [employer] is here with chest pain fatigue.  Normal vitals.  No fever.  EKG shows sinus rhythm.  No ischemic changes.  History of hypertension anxiety.  Differential diagnosis could be general fatigue but will evaluate for ACS PE electrolyte abnormality dehydration anemia infectious process.  Will get CBC CMP lipase chest x-ray troponin D-dimer.  I reviewed  interpreted EKG.  Will give IV fluid bolus.  Symptoms for about a week but mostly generalized fatigue.  No major cardiac risk factors.  Heart score is 1.  She is mostly concerned about maybe being anemic.  Lab work thus far unremarkable.  No significant leukocytosis or anemia.  Chest x-ray negative for pneumonia.  No pneumothorax. D dimer positive, will need CT PE scan.  I reviewed interpreted labs and images thus far with patient handed off to oncoming ED staff patient pending remaining labs and repeat troponin.  Please see their note for further results, evaluation, disposition  of the patient.  This chart was dictated using voice recognition software.  Despite best efforts to proofread,  errors can occur which can change the documentation meaning.     Final Clinical Impression(s) / ED Diagnoses Final diagnoses:  Nonspecific chest pain    Rx / DC Orders ED Discharge Orders     None         Lowery Rue, DO 09/27/23 1432    Lowery Rue, DO 09/27/23 1437

## 2023-09-27 NOTE — ED Triage Notes (Signed)
 Right chest pain around breast and into right shoulder blade.pt has been fatigued all week.

## 2023-09-27 NOTE — ED Provider Notes (Signed)
  Physical Exam  BP 120/83   Pulse 71   Temp 98.2 F (36.8 C) (Oral)   Resp 15   Wt 86.2 kg   SpO2 100%   BMI 31.62 kg/m   Physical Exam  Procedures  Procedures  ED Course / MDM    Received care of patient from Dr. Colonel Dears.  Please see his note for prior history, physical and care.  Briefly is a 38 year old female who presented with chest pain.  Labs are completed and personally by and interpreted by me show very mild anemia with a hemoglobin of 11.7, no leukocytosis, no clinically significant electrolyte abnormalities.  Delta troponins are both negative and have low suspicion for ACS.  CT PE study shows mild patchy groundglass opacities in the bilateral lower lobes which may be infectious or inflammatory.  Given symptoms, will treat for possible pneumonia. Recommend continued PCP follow up. Patient discharged in stable condition with understanding of reasons to return.    Scarlette Currier, MD 09/29/23 1154

## 2023-09-29 ENCOUNTER — Ambulatory Visit: Payer: Self-pay

## 2023-09-29 NOTE — Telephone Encounter (Signed)
 3rd attempt to contact patient to review request regarding labs collection from 09/25/23 from PCP. No answer, recording call can not be completed at this time, hang up and try call again later or contact operator. Not able to leave message to call back.    Chief Complaint: requesting if labs need to be completed from 09/25/23 my chart message. Last ED with labs 09/26/23. Please advise regarding abnormal lab values Symptoms: abnormal labs Frequency: 09/26/23 Pertinent Negatives: Patient denies na Disposition: [] ED /[] Urgent Care (no appt availability in office) / [] Appointment(In office/virtual)/ []  [place] [facility]/ [] Home Care/ [] Refused Recommended Disposition /[] [facility]/ [x]  Follow-up with PCP Additional Notes:   Please advise via phone or my chart messaging to patient . NT unable to reach patient x 3 attempts.      Reason for Disposition  Third attempt to contact caller AND no contact made. Phone number verified.  Answer Assessment - Initial Assessment Questions N/A 3rd attempt to contact patient for advice regarding my chart instructions to get iron  rechecked from 09/25/23. Patient last seen in ED and had labs checked but no f/u regarding abnormal lab results.  Protocols used: No Contact or Duplicate Contact Call-A-AH

## 2023-09-29 NOTE — Telephone Encounter (Signed)
 Copied from CRM [id number]. Topic: Clinical - Medical Advice >> Sep 29, 2023  9:46 AM Luane Rumps D wrote: Reason for CRM: Patient calling follow-up regarding nurse instructions from MyChart on 5/2, wondering if she should still follow through with advice given, didn't specify. Also has questions regarding her hospital visit over the weekend. Denied hospital follow-up at the moment/

## 2023-09-29 NOTE — Telephone Encounter (Signed)
 Second attempt to contact patient- call can not be completed as dialed message- unable to leave call back message.

## 2023-10-01 NOTE — Telephone Encounter (Signed)
 Pt had her ferritin checked at the hospital and its still low, pt is concerned and requests for Dr Audrey [name] to review the hospital labs and advise what to do next since pt has had iron  infusions but the levels are not improving. Please advise

## 2023-10-02 NOTE — Telephone Encounter (Signed)
 I reviewed the labs, and I see her iron  levels are quite low again. I suggest she see Kirstin Curcic NP again (Hematology-Oncology at [facility]). She saw her last fall, and they arranged the first iron  infusion. Since [facility] is already established with her, she could arrange for another infusion much faster than we could

## 2023-10-21 ENCOUNTER — Ambulatory Visit (INDEPENDENT_AMBULATORY_CARE_PROVIDER_SITE_OTHER): Admitting: Family Medicine

## 2023-10-21 ENCOUNTER — Encounter: Payer: Self-pay | Admitting: Family Medicine

## 2023-10-21 ENCOUNTER — Ambulatory Visit: Payer: Self-pay

## 2023-10-21 VITALS — BP 100/76 | HR 85 | Temp 98.3°F | Wt 220.0 lb

## 2023-10-21 DIAGNOSIS — R739 Hyperglycemia, unspecified: Secondary | ICD-10-CM

## 2023-10-21 DIAGNOSIS — G4730 Sleep apnea, unspecified: Secondary | ICD-10-CM | POA: Diagnosis not present

## 2023-10-21 DIAGNOSIS — R5383 Other fatigue: Secondary | ICD-10-CM | POA: Diagnosis not present

## 2023-10-21 LAB — POCT GLYCOSYLATED HEMOGLOBIN (HGB A1C): Hemoglobin A1C: 4.9 % (ref 4.0–5.6)

## 2023-10-21 NOTE — Telephone Encounter (Signed)
 Copied from CRM [id number]. Topic: Clinical - Red Word Triage >> Oct 21, 2023  1:40 PM Rosamond Comes wrote: Red Word that prompted transfer to Nurse Triage: patient was in ED on 09/27/23 for Anemia, patient is experiencing brain fog, forgetting things, feels its getting worse.   Chief Complaint: Confusion  Symptoms: Confusion/brain fog, forgetfulness   Frequency: Intermittent  Disposition: [] ED /[] Urgent Care (no appt availability in office) / [x] Appointment(In office/virtual)/ []  [facility] [facility]/ [] Home Care/ [] Refused Recommended Disposition /[] [facility]/ []  Follow-up with PCP Additional Notes: Patient reports she has been experiencing confusion/brain fog for the last several months that has been worsening. She states that she is now having difficulty remembering to do things she had planned, or forgets where she put something, and has also had some difficulty remembering what she's done earlier in the day. She states that she would like an appointment to discuss possible causes for her symptoms. Appointment made for the patient today for evaluation.     Reason for Disposition  [1] Longstanding confusion (e.g., dementia, stroke) AND [2] worsening    Brain fog  Additional Information  Commented on: [1] Longstanding confusion (e.g., dementia, stroke) AND [2] NO worsening or change    Brain fog getting worse for the last few months  Answer Assessment - Initial Assessment Questions 1. LEVEL OF CONSCIOUSNESS: "How is he (she, the patient) acting right now?" (e.g., alert-oriented, confused, lethargic, stuporous, comatose)     Increased confusion/brain fog  2. ONSET: "When did the confusion start?"  (minutes, hours, days)     Worse over the last couple of months  3. PATTERN "Does this come and go, or has it been constant since it started?"  "Is it present now?"     Intermittent  4. ALCOHOL or DRUGS: "Has he been drinking alcohol or taking any drugs?"      No 5. NARCOTIC  MEDICINES: "Has he been receiving any narcotic medications?" (e.g., morphine, Vicodin)     No 6. CAUSE: "What do you think is causing the confusion?"      Recently treated for anemia  7. OTHER SYMPTOMS: "Are there any other symptoms?" (e.g., difficulty breathing, headache, fever, weakness)     No  Protocols used: Confusion - Delirium-A-AH

## 2023-10-21 NOTE — Addendum Note (Signed)
 Addended by: Philbert Brave on: 10/21/2023 05:28 PM   Modules accepted: Orders

## 2023-10-21 NOTE — Progress Notes (Signed)
   Subjective:    Patient ID: Audrey [name], female    DOB: January 02, 1986, 38 y.o.   MRN: [id number]  HPI Here for several months of constant fatigue and worsening forgetfulness. She has to make a checklist in her phone every day of the things she needs to do or she will forget half of them. She says it is hard to focus and she has "brain fog" some days. She is always tired and sleepy, even though she sleeps well at night. She is seeing Ricki Charon NP at [facility] for her anemia. She had an iron  infusion last September, and she felt better for a month or so. Then the fatigue got worse again. She has recent labs showing a Hgb of 11.9, iron  9, ferritin 31, and B12 257. She is scheduled for another iron  infusion on 10-23-23. She will also start on B12 shots soon. Her recent TSH and free T4 were normal. Her A1c today is 4.9%. she has gained 50 lbs since last September.    Review of Systems  Constitutional:  Positive for fatigue.  Respiratory: Negative.    Cardiovascular: Negative.   Gastrointestinal: Negative.   Genitourinary: Negative.        Objective:   Physical Exam Constitutional:      General: She is not in acute distress.    Appearance: She is obese.  Cardiovascular:     Rate and Rhythm: Normal rate and regular rhythm.     Pulses: Normal pulses.     Heart sounds: Normal heart sounds.  Pulmonary:     Effort: Pulmonary effort is normal.     Breath sounds: Normal breath sounds.  Neurological:     Mental Status: She is alert.           Assessment & Plan:  She has constant fatigue and sleepiness, along with trouble focusing and remembering. She has iron  deficiency anemia, and this may be a factor, however I suspect a large component of this is due to sleep apnea. We will refer her to Pulmonology to set up a sleep study.  Corita Diego, MD

## 2023-10-22 NOTE — Telephone Encounter (Signed)
Patient was seen on 5/27.

## 2023-11-06 ENCOUNTER — Ambulatory Visit: Admitting: Sleep Medicine

## 2023-11-14 ENCOUNTER — Encounter: Payer: Self-pay | Admitting: Sleep Medicine

## 2023-11-14 ENCOUNTER — Ambulatory Visit: Admitting: Sleep Medicine

## 2023-11-14 ENCOUNTER — Other Ambulatory Visit: Payer: Self-pay | Admitting: Sleep Medicine

## 2023-11-14 VITALS — BP 118/72 | HR 84 | Temp 96.9°F | Ht 65.0 in | Wt 221.2 lb

## 2023-11-14 DIAGNOSIS — E669 Obesity, unspecified: Secondary | ICD-10-CM | POA: Diagnosis not present

## 2023-11-14 DIAGNOSIS — G4733 Obstructive sleep apnea (adult) (pediatric): Secondary | ICD-10-CM | POA: Diagnosis not present

## 2023-11-14 MED ORDER — ZEPBOUND 2.5 MG/0.5ML ~~LOC~~ SOAJ: 2.5000 mg | SUBCUTANEOUS | 1 refills | Status: DC

## 2023-11-14 NOTE — Progress Notes (Signed)
 Name:Audrey [name] MRN: [id number] DOB: 1985/07/27   CHIEF COMPLAINT:  EXCESSIVE DAYTIME SLEEPINESS   HISTORY OF PRESENT ILLNESS:  Audrey [name] is a 38 y.o. w/ a h/o obesity s/p bariatric surgery and anxiety who presents for c/o fatigue and excessive daytime sleepiness which has been present for several years. Reports nocturnal awakenings due to nocturia, however does not have difficulty falling back to sleep. Reports an 80 lb weight loss over the last two years. Admits to morning headaches, RLS symptoms, dream enactment, cataplexy, hypnagogic or hypnapompic hallucinations. Reports a family history of sleep apnea. Reports occasional drowsy driving. Drinks 1-2 sodas and sweet tea throughout the day, denies alcohol, tobacco or illicit drug use.   Bedtime 9-10 pm Sleep onset 10 mins Rise time 7-9 am   EPWORTH SLEEP SCORE 4    11/14/2023    9:00 AM  Results of the Epworth flowsheet  Sitting and reading 1  Watching TV 1  Sitting, inactive in a public place (e.g. a theatre or a meeting) 1  As a passenger in a car for an hour without a break 0  Lying down to rest in the afternoon when circumstances permit 1  Sitting and talking to someone 0  Sitting quietly after a lunch without alcohol 0  In a car, while stopped for a few minutes in traffic 0  Total score 4     PAST MEDICAL HISTORY :   has a past medical history of Allergy, Anxiety, BV (bacterial vaginosis) (12/30/2012), Chicken pox, Chronic kidney disease, Contraceptive management (07/15/2013), Hay fever, History of kidney stones, Hypertension, Irregular bleeding (12/30/2012), Leukorrhea (10/07/2013), Migraines, PONV (postoperative nausea and vomiting), Postcoital bleeding (05/13/2013), UTI (lower urinary tract infection) (08/24/2014), and UTI (urinary tract infection).  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 Prior to Admission medications   Medication Sig Start Date End Date Taking? Authorizing Provider  Cholecalciferol 1.25 MG (50000 UT) capsule Take 1 capsule by mouth once a week. 01/29/23  Yes [provider]  diphenhydrAMINE  (BENADRYL ) 25 MG tablet Take 1 tablet (25 mg total) by mouth every 8 (eight) hours as needed for allergies. 02/12/23  Yes Audrey [name], Julie, PA-C  FEROSUL 325 (65 Fe) MG tablet Take 325 mg by mouth daily with breakfast. TAKING 3 Tablets TID 01/06/23  Yes [provider]  LORazepam  (ATIVAN ) 0.5 MG tablet Take 1 tablet (0.5 mg total) by mouth 2 (two) times daily as needed for anxiety. 02/03/23  Yes Audrey Furth, MD  Multiple Vitamin (MULTIVITAMIN) tablet Take 1 tablet by mouth daily.   Yes [provider]  ondansetron  (ZOFRAN -ODT) 4 MG disintegrating tablet Take 4 mg by mouth every 6 (six) hours as needed. 01/06/23  Yes [provider]  norethindrone  (MICRONOR ) 0.35 MG tablet Take 1 tablet (0.35 mg total) by mouth daily. Patient not taking: Reported on 11/14/2023 08/20/23   Audrey Messing, NP   Allergies  Allergen Reactions   Bee Venom Shortness Of Breath and Swelling   Oxycodone -Acetaminophen  Hives and Itching   Penicillins Hives, Shortness Of Breath, Itching, Swelling and Rash   Sulfamethoxazole -Trimethoprim  Itching and Rash   Tape Hives and Rash    Blisters   Wound Dressing Adhesive Hives and Rash    Clear tape    Blisters   Codeine Itching and Rash    Can tolerate with benadryl   Metoclopramide  Anxiety    FAMILY HISTORY:  family history includes Arthritis in her mother; Atrial fibrillation in her mother; Breast cancer in her maternal aunt; Dementia in her father; Diabetes in her father and paternal uncle; Hyperlipidemia in her mother; Hypertension in her mother; Lung cancer in her mother; Stroke in her mother. SOCIAL HISTORY:  reports that she has  never smoked. She has never used smokeless tobacco. She reports that she does not drink alcohol and does not use drugs.   Review of Systems:  Gen:  Denies  fever, sweats, chills weight loss  HEENT: Denies blurred vision, double vision, ear pain, eye pain, hearing loss, nose bleeds, sore throat Cardiac:  No dizziness, chest pain or heaviness, chest tightness,edema, No JVD Resp:   No cough, -sputum production, -shortness of breath,-wheezing, -hemoptysis,  Gi: Denies swallowing difficulty, stomach pain, nausea or vomiting, diarrhea, constipation, bowel incontinence Gu:  Denies bladder incontinence, burning urine Ext:   Denies Joint pain, stiffness or swelling Skin: Denies  skin rash, easy bruising or bleeding or hives Endoc:  Denies polyuria, polydipsia , polyphagia or weight change Psych:   Denies depression, insomnia or hallucinations  Other:  All other systems negative  VITAL SIGNS: BP 118/72 (BP Location: Right Arm, Cuff Size: Large)   Pulse 84   Temp (!) 96.9 F (36.1 C)   Ht 5' 5 (1.651 m)   Wt 221 lb 3.2 oz (100.3 kg)   SpO2 99%   BMI 36.81 kg/m     Physical Examination:   General Appearance: No distress  EYES PERRLA, EOM intact.   NECK Supple, No JVD Pulmonary: normal breath sounds, No wheezing.  CardiovascularNormal S1,S2.  No m/r/g.   Abdomen: Benign, Soft, non-tender. Skin:   warm, no rashes, no ecchymosis  Extremities: normal, no cyanosis, clubbing. Neuro:without focal findings,  speech normal  PSYCHIATRIC: Mood, affect within normal limits.   ASSESSMENT AND PLAN  OSA I suspect that OSA is likely present due to clinical presentation. Discussed the consequences of untreated sleep apnea. Advised not to drive drowsy for safety of patient and others. Will complete further evaluation with a home sleep study and follow up to review results.    Obesity Counseled patient on diet and lifestyle modification. Will also try patient on Zepbound and titrate as tolerated.  Denies any family or personal history of thyroid  medullary CA or pancreatitis.    MEDICATION ADJUSTMENTS/LABS AND TESTS ORDERED: Recommend Sleep Study   Patient  satisfied with Plan of action and management. All questions answered  Follow up to review HST results and treatment plan.   I spent a total of 48 minutes reviewing chart data, face-to-face evaluation with the patient, counseling and coordination of care as detailed above.    Herald Vallin, M.D.  Sleep Medicine [facility]

## 2023-11-14 NOTE — Patient Instructions (Signed)
 Audrey [name]

## 2023-11-14 NOTE — Telephone Encounter (Signed)
 Sent to PA team.

## 2023-11-17 ENCOUNTER — Other Ambulatory Visit (HOSPITAL_COMMUNITY): Payer: Self-pay

## 2023-11-17 ENCOUNTER — Telehealth: Payer: Self-pay

## 2023-11-17 NOTE — Telephone Encounter (Signed)
*  Pulm  Pharmacy Patient Advocate Encounter  Received notification from [employer] that Prior Authorization for Zepbound  2.5MG /0.5ML pen-injectors  has been CANCELLED due to Drug is not covered by plan

## 2023-11-17 NOTE — Telephone Encounter (Signed)
 Insurance will not cover medication. Must seek out alternatives.

## 2023-11-17 NOTE — Telephone Encounter (Signed)
 Message from [employer]: Drug is not covered by plan

## 2023-11-23 ENCOUNTER — Encounter: Payer: Self-pay | Admitting: Family Medicine

## 2023-11-24 MED ORDER — TIRZEPATIDE 2.5 MG/0.5ML ~~LOC~~ SOAJ: 2.5000 mg | SUBCUTANEOUS | 5 refills | Status: DC

## 2023-11-24 NOTE — Telephone Encounter (Signed)
 I sent in the [facility]

## 2023-11-25 ENCOUNTER — Telehealth: Payer: Self-pay

## 2023-11-25 ENCOUNTER — Other Ambulatory Visit (HOSPITAL_COMMUNITY): Payer: Self-pay

## 2023-11-25 NOTE — Telephone Encounter (Signed)
 I do not think there is any way to get her insurance to cover it

## 2023-11-25 NOTE — Telephone Encounter (Signed)
 Pt notified of the PA denial

## 2023-11-25 NOTE — Telephone Encounter (Signed)
 Patient A1C is 4.9.  Tried to run test claim for Zepbound  2.5 , patient has plan benefit exclusion, product/ service not covered.  Ozempic/Mounjaro is approved exclusively as an adjunct to diet and exercise to improve glycemic  control in adults with type 2 diabetes mellitus. A review of patient's medical chart reveals no  documented diagnosis of type 2 diabetes or an A1C indicative of diabetes. Therefore, they do not  currently meet the criteria for prior authorization of this medication. If clinically appropriate, alternative  options such as Saxenda, Zepbound , or Georjean may be considered for this patient.

## 2023-12-09 ENCOUNTER — Encounter: Payer: Self-pay | Admitting: Family Medicine

## 2023-12-11 NOTE — Telephone Encounter (Signed)
The letter is ready to pick up  °

## 2024-01-12 ENCOUNTER — Encounter: Payer: Self-pay | Admitting: Family Medicine

## 2024-01-12 ENCOUNTER — Ambulatory Visit: Admitting: Family Medicine

## 2024-01-12 VITALS — BP 96/60 | HR 132 | Temp 101.3°F | Wt 221.0 lb

## 2024-01-12 DIAGNOSIS — R509 Fever, unspecified: Secondary | ICD-10-CM

## 2024-01-12 DIAGNOSIS — J40 Bronchitis, not specified as acute or chronic: Secondary | ICD-10-CM | POA: Diagnosis not present

## 2024-01-12 LAB — POC COVID19 BINAXNOW: SARS Coronavirus 2 Ag: NEGATIVE

## 2024-01-12 LAB — POCT INFLUENZA A/B
Influenza A, POC: NEGATIVE
Influenza B, POC: NEGATIVE

## 2024-01-12 MED ORDER — HYDROCODONE BIT-HOMATROP MBR 5-1.5 MG/5ML PO SOLN: 5.0000 mL | ORAL | 0 refills | Status: AC | PRN

## 2024-01-12 MED ORDER — ONDANSETRON HCL 8 MG PO TABS: 8.0000 mg | ORAL_TABLET | Freq: Three times a day (TID) | ORAL | 2 refills | Status: AC | PRN

## 2024-01-12 MED ORDER — DOXYCYCLINE HYCLATE 100 MG PO TABS: 100.0000 mg | ORAL_TABLET | Freq: Two times a day (BID) | ORAL | 0 refills | Status: AC

## 2024-01-12 NOTE — Progress Notes (Signed)
   Subjective:    Patient ID: Audrey [name], female    DOB: 10-May-1986, 38 y.o.   MRN: [id number]  HPI Here for 5 days of fever, ST, and a hard dry cough. No SOB or chest pain. Of note she tested positive for Covid on 12-26-23 when she had muscle aches and a fever. In fact he whole family tested positive then. In the next few weeks, se began to feel better and was almost back to normal when these recent symptoms began. Today she tested negative for Covid and flu. She is taking Tylenol  and Advil .    Review of Systems  Constitutional:  Positive for fever.  HENT:  Positive for congestion and sore throat. Negative for ear pain, postnasal drip and sinus pressure.   Eyes: Negative.   Respiratory:  Positive for cough. Negative for shortness of breath and wheezing.   Musculoskeletal: Negative.        Objective:   Physical Exam Constitutional:      Comments: Coughing frequently   HENT:     Right Ear: Tympanic membrane, ear canal and external ear normal.     Left Ear: Tympanic membrane, ear canal and external ear normal.     Nose: Nose normal.     Mouth/Throat:     Pharynx: Oropharynx is clear.  Eyes:     Conjunctiva/sclera: Conjunctivae normal.  Pulmonary:     Effort: Pulmonary effort is normal.     Breath sounds: Rhonchi present. No wheezing or rales.  Lymphadenopathy:     Cervical: No cervical adenopathy.  Neurological:     Mental Status: She is alert.           Assessment & Plan:  Bronchitis, treat with 10 days of Doxycycline . The Covid infection has resolved. Garnette Olmsted, MD

## 2024-01-12 NOTE — Addendum Note (Signed)
 Addended by: LADONNA INOCENTE SAILOR on: 01/12/2024 04:44 PM   Modules accepted: Orders

## 2024-03-02 ENCOUNTER — Encounter

## 2024-03-02 DIAGNOSIS — G4733 Obstructive sleep apnea (adult) (pediatric): Secondary | ICD-10-CM

## 2024-03-15 ENCOUNTER — Ambulatory Visit: Payer: Self-pay

## 2024-03-15 DIAGNOSIS — G4733 Obstructive sleep apnea (adult) (pediatric): Secondary | ICD-10-CM

## 2024-03-15 DIAGNOSIS — G4730 Sleep apnea, unspecified: Secondary | ICD-10-CM | POA: Diagnosis not present

## 2024-03-15 NOTE — Telephone Encounter (Signed)
-----   Message from [facility] D REDDY sent at 03/15/2024  2:18 PM EDT ----- Please notify patient that HST revealed moderate OSA, recommend proceeding with APAP therapy set to 4-16 cm H2O, EPR 3 with the Airtouch N30i nasal mask. Please also schedule a 3 month follow up  visit if patient wishes to proceed with CPAP therapy. Thanks    ----- Message ----- From: Vannie Donzell RAMAN Sent: 03/15/2024  10:43 AM EDT To: Pallavi D Reddy, MD

## 2024-03-28 ENCOUNTER — Encounter: Payer: Self-pay | Admitting: Family Medicine

## 2024-03-29 ENCOUNTER — Other Ambulatory Visit (HOSPITAL_COMMUNITY): Payer: Self-pay

## 2024-03-29 ENCOUNTER — Telehealth: Payer: Self-pay

## 2024-03-29 NOTE — Telephone Encounter (Signed)
 Copied from CRM #[id number]. Topic: Clinical - Medication Question >> Mar 29, 2024  9:39 AM Isabell A wrote: Reason for CRM: Patient requesting for provider to resubmit [facility] Zepbound  2.5mg .  Please [facility] and not [facility].  Member # [id number] Group ID # [id number]  Callback number: [phone]

## 2024-03-31 NOTE — Telephone Encounter (Signed)
 I prescribed her Mounjaro  in June. Did she take this? If so, how did she do?

## 2024-04-01 MED ORDER — TIRZEPATIDE 2.5 MG/0.5ML ~~LOC~~ SOAJ: 2.5000 mg | SUBCUTANEOUS | 5 refills | Status: AC

## 2024-04-01 NOTE — Telephone Encounter (Signed)
 I sent in a new RX for Mounjaro . This time when they ask for a PA, we will try to work with them

## 2024-04-02 ENCOUNTER — Other Ambulatory Visit (HOSPITAL_COMMUNITY): Payer: Self-pay

## 2024-04-02 ENCOUNTER — Telehealth

## 2024-04-02 NOTE — Telephone Encounter (Signed)
 Pharmacy Patient Advocate Encounter   Received notification from Onbase that prior authorization for Mounjaro  2.5 is required/requested.   Insurance verification completed.   The patient is insured through [employer].   Patient message states this request is for Zepbound  2.5. Patient also asks to use [facility] rather than [facility]. To use [facility] will need all billing info, Bin, PCN. The request from the pharmacy was for Mounjaro  2.5 not Zepbound .  To use ICD 10 code G47.33 for OSA, will need some chart notes supporting it. Please advise

## 2024-04-02 NOTE — Telephone Encounter (Signed)
 Billing info, [id number], PCN- [id number].

## 2024-04-05 ENCOUNTER — Ambulatory Visit: Admitting: Sleep Medicine

## 2024-04-09 ENCOUNTER — Other Ambulatory Visit (HOSPITAL_COMMUNITY): Payer: Self-pay

## 2024-04-09 NOTE — Telephone Encounter (Signed)
 She saw her Pulmonary Dr on 11/14/23 and it was documented of Pt OSA, can you use those notes for the PA

## 2024-04-12 ENCOUNTER — Other Ambulatory Visit (HOSPITAL_COMMUNITY): Payer: Self-pay

## 2024-04-12 ENCOUNTER — Telehealth: Payer: Self-pay

## 2024-04-12 NOTE — Telephone Encounter (Signed)
 Pharmacy Patient Advocate Encounter   Received notification from Pt Calls Messages that prior authorization for Zepbound  2.5 is required/requested.   Insurance verification completed.   The patient is insured through [employer]. I used the insurance information from patient messages.   Per test claim: Per test claim, medication is not covered due to plan/benefit exclusion, PA not submitted at this time  I tried CMM anyway for OSA indication.   No PA submitted at this time.

## 2024-04-12 NOTE — Telephone Encounter (Signed)
 Please submit a PA for Zepbound  with the OSA diagnoses

## 2024-04-14 ENCOUNTER — Ambulatory Visit: Admitting: Sleep Medicine

## 2024-05-10 ENCOUNTER — Encounter: Payer: Self-pay | Admitting: Internal Medicine

## 2024-05-11 ENCOUNTER — Ambulatory Visit: Admitting: Sleep Medicine

## 2024-05-13 ENCOUNTER — Ambulatory Visit: Admitting: Sleep Medicine

## 2024-05-13 ENCOUNTER — Encounter: Payer: Self-pay | Admitting: Sleep Medicine

## 2024-05-13 VITALS — BP 100/80 | HR 85 | Temp 97.8°F | Ht 65.0 in | Wt 224.6 lb

## 2024-05-13 DIAGNOSIS — E669 Obesity, unspecified: Secondary | ICD-10-CM | POA: Diagnosis not present

## 2024-05-13 DIAGNOSIS — G4733 Obstructive sleep apnea (adult) (pediatric): Secondary | ICD-10-CM | POA: Diagnosis not present

## 2024-05-13 NOTE — Progress Notes (Signed)
 Name:Audrey [name] MRN: [id number] DOB: Mar 06, 1986   CHIEF COMPLAINT:  CPAP F/U   HISTORY OF PRESENT ILLNESS:  Audrey [name] is a 38 y.o. w/ a h/o obesity s/p bariatric surgery and anxiety who presents for CPAP follow up visit. Reports difficulty using CPAP therapy initially due to mask discomfort. States that she has received a smaller frame which is working out better. Overall reports feeling more refreshed upon awakening with CPAP therapy.     EPWORTH SLEEP SCORE 4    11/14/2023    9:00 AM  Results of the Epworth flowsheet  Sitting and reading 1  Watching TV 1  Sitting, inactive in a public place (e.g. a theatre or a meeting) 1  As a passenger in a car for an hour without a break 0  Lying down to rest in the afternoon when circumstances permit 1  Sitting and talking to someone 0  Sitting quietly after a lunch without alcohol 0  In a car, while stopped for a few minutes in traffic 0  Total score 4    PAST MEDICAL HISTORY :   has a past medical history of Allergy, Anxiety, BV (bacterial vaginosis) (12/30/2012), Chicken pox, Chronic kidney disease, Contraceptive management (07/15/2013), Hay fever, History of kidney stones, Hypertension, Irregular bleeding (12/30/2012), Leukorrhea (10/07/2013), Migraines, PONV (postoperative nausea and vomiting), Postcoital bleeding (05/13/2013), UTI (lower urinary tract infection) (08/24/2014), and UTI (urinary tract infection).  has a past surgical history that includes Tonsillectomy; Dilation and curettage of uterus; Cholecystectomy (N/A, 03/09/2014); Laparoscopic gastric sleeve resection (04/2021); and Cystoscopy w/ ureteral stent placement (Right, 07/10/2021). Prior to Admission medications   Medication Sig Start Date End Date Taking? Authorizing Provider  Cholecalciferol 1.25 MG (50000 UT) capsule Take 1 capsule by mouth once a week. 01/29/23  Yes [provider]  diphenhydrAMINE  (BENADRYL ) 25 MG tablet Take 1 tablet (25 mg  total) by mouth every 8 (eight) hours as needed for allergies. 02/12/23  Yes Idol, Julie, PA-C  FEROSUL 325 (65 Fe) MG tablet Take 325 mg by mouth daily with breakfast. TAKING 3 Tablets TID 01/06/23  Yes [provider]  LORazepam  (ATIVAN ) 0.5 MG tablet Take 1 tablet (0.5 mg total) by mouth 2 (two) times daily as needed for anxiety. 02/03/23  Yes Johnny Garnette LABOR, MD  Multiple Vitamin (MULTIVITAMIN) tablet Take 1 tablet by mouth daily.   Yes [provider]  ondansetron  (ZOFRAN -ODT) 4 MG disintegrating tablet Take 4 mg by mouth every 6 (six) hours as needed. 01/06/23  Yes [provider]  norethindrone  (MICRONOR ) 0.35 MG tablet Take 1 tablet (0.35 mg total) by mouth daily. Patient not taking: Reported on 11/14/2023 08/20/23   Signa Delon LABOR, NP   Allergies  Allergen Reactions   Bee Venom Shortness Of Breath and Swelling   Iron  Sucrose Other (See Comments)    Fever, nausea, rash  Venifir (injectifir)   Oxycodone -Acetaminophen  Hives and Itching   Penicillins Hives, Shortness Of Breath, Itching, Swelling and Rash   Sulfamethoxazole -Trimethoprim  Itching and Rash   Tape Hives and Rash    Blisters   Wound Dressing Adhesive Hives and Rash    Clear tape    Blisters   Codeine Itching and Rash    Can tolerate with benadryl    Metoclopramide  Anxiety    FAMILY HISTORY:  family history includes Arthritis in her mother; Atrial fibrillation in her mother; Breast cancer in her maternal aunt; Dementia in her father; Diabetes in her father and paternal uncle; Hyperlipidemia in  her mother; Hypertension in her mother; Lung cancer in her mother; Stroke in her mother. SOCIAL HISTORY:  reports that she has never smoked. She has never used smokeless tobacco. She reports that she does not drink alcohol and does not use drugs.   Review of Systems:  Gen:  Denies  fever, sweats, chills weight loss  HEENT: Denies blurred vision, double vision, ear pain, eye pain, hearing loss, nose  bleeds, sore throat Cardiac:  No dizziness, chest pain or heaviness, chest tightness,edema, No JVD Resp:   No cough, -sputum production, -shortness of breath,-wheezing, -hemoptysis,  Gi: Denies swallowing difficulty, stomach pain, nausea or vomiting, diarrhea, constipation, bowel incontinence Gu:  Denies bladder incontinence, burning urine Ext:   Denies Joint pain, stiffness or swelling Skin: Denies  skin rash, easy bruising or bleeding or hives Endoc:  Denies polyuria, polydipsia , polyphagia or weight change Psych:   Denies depression, insomnia or hallucinations  Other:  All other systems negative  VITAL SIGNS: BP 100/80   Pulse 85   Temp 97.8 F (36.6 C)   Ht 5' 5 (1.651 m)   Wt 224 lb 9.6 oz (101.9 kg)   LMP 04/22/2024 (Within Weeks)   SpO2 96%   BMI 37.38 kg/m     Physical Examination:   General Appearance: No distress  EYES PERRLA, EOM intact.   NECK Supple, No JVD Pulmonary: normal breath sounds, No wheezing.  CardiovascularNormal S1,S2.  No m/r/g.   Abdomen: Benign, Soft, non-tender. Skin:   warm, no rashes, no ecchymosis  Extremities: normal, no cyanosis, clubbing. Neuro:without focal findings,  speech normal  PSYCHIATRIC: Mood, affect within normal limits.   ASSESSMENT AND PLAN  OSA Counseled patient on increasing CPAP compliance. Discussed the consequences of untreated sleep apnea. Advised not to drive drowsy for safety of patient and others. Will follow up in 3 months.    Obesity Counseled patient on diet and lifestyle modification.    Patient  satisfied with Plan of action and management. All questions answered  I spent a total of 25 minutes reviewing chart data, face-to-face evaluation with the patient, counseling and coordination of care as detailed above.    Tayen Narang, M.D.  Sleep Medicine [facility]

## 2024-06-22 ENCOUNTER — Ambulatory Visit: Admitting: Internal Medicine

## 2024-08-11 ENCOUNTER — Ambulatory Visit: Admitting: Sleep Medicine
